# Patient Record
Sex: Male | Born: 1960 | Race: White | Hispanic: No | Marital: Married | Smoking: Never smoker
Health system: Southern US, Community
[De-identification: ages and names within clinical notes are randomized; demographics above are authoritative.]

## PROBLEM LIST (undated history)

## (undated) DIAGNOSIS — I10 Essential (primary) hypertension: Secondary | ICD-10-CM

## (undated) DIAGNOSIS — Z9889 Other specified postprocedural states: Secondary | ICD-10-CM

## (undated) DIAGNOSIS — J302 Other seasonal allergic rhinitis: Secondary | ICD-10-CM

## (undated) DIAGNOSIS — K219 Gastro-esophageal reflux disease without esophagitis: Secondary | ICD-10-CM

## (undated) DIAGNOSIS — M1990 Unspecified osteoarthritis, unspecified site: Secondary | ICD-10-CM

## (undated) DIAGNOSIS — I2699 Other pulmonary embolism without acute cor pulmonale: Secondary | ICD-10-CM

## (undated) DIAGNOSIS — R51 Headache: Secondary | ICD-10-CM

## (undated) DIAGNOSIS — K589 Irritable bowel syndrome without diarrhea: Secondary | ICD-10-CM

## (undated) DIAGNOSIS — Z87442 Personal history of urinary calculi: Secondary | ICD-10-CM

## (undated) DIAGNOSIS — I739 Peripheral vascular disease, unspecified: Secondary | ICD-10-CM

## (undated) DIAGNOSIS — R112 Nausea with vomiting, unspecified: Secondary | ICD-10-CM

## (undated) DIAGNOSIS — R519 Headache, unspecified: Secondary | ICD-10-CM

## (undated) HISTORY — PX: ANKLE SURGERY: SHX546

## (undated) HISTORY — PX: TONSILLECTOMY: SUR1361

## (undated) HISTORY — PX: KNEE SURGERY: SHX244

## (undated) HISTORY — PX: CARDIAC CATHETERIZATION: SHX172

## (undated) SURGERY — ARTHROPLASTY, KNEE, TOTAL
Anesthesia: Choice | Laterality: Left

---

## 1990-12-03 DIAGNOSIS — I2699 Other pulmonary embolism without acute cor pulmonale: Secondary | ICD-10-CM

## 1990-12-03 HISTORY — DX: Other pulmonary embolism without acute cor pulmonale: I26.99

## 1999-12-04 HISTORY — PX: PERIPHERALLY INSERTED CENTRAL CATHETER INSERTION: SHX2221

## 2000-12-03 HISTORY — PX: VOCAL CORD INJECTION: SHX2663

## 2002-07-21 ENCOUNTER — Ambulatory Visit (HOSPITAL_COMMUNITY): Admission: RE | Admit: 2002-07-21 | Discharge: 2002-07-22 | Payer: Self-pay | Admitting: Cardiology

## 2002-11-17 ENCOUNTER — Inpatient Hospital Stay (HOSPITAL_COMMUNITY): Admission: RE | Admit: 2002-11-17 | Discharge: 2002-11-20 | Payer: Self-pay | Admitting: Orthopedic Surgery

## 2002-11-17 ENCOUNTER — Encounter: Payer: Self-pay | Admitting: Orthopedic Surgery

## 2002-11-18 ENCOUNTER — Encounter: Payer: Self-pay | Admitting: Orthopedic Surgery

## 2003-04-28 ENCOUNTER — Inpatient Hospital Stay (HOSPITAL_COMMUNITY): Admission: AD | Admit: 2003-04-28 | Discharge: 2003-05-05 | Payer: Self-pay | Admitting: Orthopedic Surgery

## 2003-05-04 ENCOUNTER — Encounter: Payer: Self-pay | Admitting: Orthopedic Surgery

## 2007-12-04 DIAGNOSIS — I739 Peripheral vascular disease, unspecified: Secondary | ICD-10-CM

## 2007-12-04 HISTORY — DX: Peripheral vascular disease, unspecified: I73.9

## 2011-03-22 ENCOUNTER — Encounter (HOSPITAL_BASED_OUTPATIENT_CLINIC_OR_DEPARTMENT_OTHER): Payer: BC Managed Care – PPO | Admitting: Internal Medicine

## 2011-03-22 ENCOUNTER — Ambulatory Visit (HOSPITAL_COMMUNITY)
Admission: RE | Admit: 2011-03-22 | Discharge: 2011-03-22 | Disposition: A | Discharge status: Home / Self Care | Payer: BC Managed Care – PPO | Source: Ambulatory Visit | Attending: Internal Medicine | Admitting: Internal Medicine

## 2011-03-22 DIAGNOSIS — K644 Residual hemorrhoidal skin tags: Secondary | ICD-10-CM | POA: Insufficient documentation

## 2011-03-22 DIAGNOSIS — Z79899 Other long term (current) drug therapy: Secondary | ICD-10-CM | POA: Insufficient documentation

## 2011-03-22 DIAGNOSIS — Z7982 Long term (current) use of aspirin: Secondary | ICD-10-CM | POA: Insufficient documentation

## 2011-03-22 DIAGNOSIS — Z1211 Encounter for screening for malignant neoplasm of colon: Secondary | ICD-10-CM | POA: Insufficient documentation

## 2011-03-22 DIAGNOSIS — I10 Essential (primary) hypertension: Secondary | ICD-10-CM | POA: Insufficient documentation

## 2011-04-08 NOTE — Op Note (Signed)
  NAME:  Jeremy [name], Jeremy [name]                ACCOUNT NO.:  [id number]  MEDICAL RECORD NO.:  [id number]           PATIENT TYPE:  O  LOCATION:  [facility]                          FACILITY:  [facility]  PHYSICIAN:  Lionel December, M.D.    DATE OF BIRTH:  12-22-1960  DATE OF PROCEDURE: DATE OF DISCHARGE:                              OPERATIVE REPORT   PROCEDURE:  Colonoscopy.  INDICATION:  The patient is 50 year old Caucasian male who is undergoing average risk screening colonoscopy.  Procedures were reviewed with the patient.  Informed consent was obtained.  MEDS FOR CONSCIOUS SEDATION:  Demerol 50 mg IV, Versed 10 mg IV in divided dose.  FINDINGS:  Procedure performed in endoscopy suite.  The patient's vital signs and O2 sat were monitored during the procedure and remained stable.  The patient was placed in left lateral recumbent position and rectal examination performed.  No abnormality noted on external or digital exam.  Pentax videoscope was placed through rectum and advanced under vision into sigmoid colon and beyond.  Preparation was excellent. Scope was easily passed into cecum which was identified by ileocecal valve and appendiceal orifice.  There was a scar over the ileocecal valve.  Pictures were taken for the record.  As the scope was withdrawn, colonic mucosa was carefully examined and was normal throughout.  The rectal mucosa similarly was normal.  Scope was retroflexed to examine anorectal junction and small hemorrhoids noted below the dentate line. Endoscope was then withdrawn.  Withdrawal time was 12 minutes.  The patient tolerated the procedure well.  FINAL DIAGNOSES: 1. 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 2. Small external hemorrhoids, otherwise normal colonoscopy.  RECOMMENDATIONS: 1. Standard instructions given.  He will resume his Coumadin today and     get his INR checked in 10-14 days. 2. His next  screening for CRC would be in 10 years.     Lionel December, M.D.     NR/MEDQ  D:  03/22/2011  T:  03/22/2011  Job:  [id number]  cc:   Donzetta Sprung Fax: [phone]  Electronically Signed by Lionel December M.D. on 04/08/2011 09:42:00 PM

## 2016-02-27 ENCOUNTER — Other Ambulatory Visit: Payer: Self-pay | Admitting: Orthopedic Surgery

## 2016-02-28 ENCOUNTER — Other Ambulatory Visit: Payer: Self-pay | Admitting: Orthopedic Surgery

## 2016-02-29 ENCOUNTER — Encounter (HOSPITAL_COMMUNITY): Payer: Self-pay

## 2016-02-29 ENCOUNTER — Encounter (HOSPITAL_COMMUNITY)
Admission: RE | Admit: 2016-02-29 | Discharge: 2016-02-29 | Disposition: A | Discharge status: Home / Self Care | Payer: PRIVATE HEALTH INSURANCE | Source: Ambulatory Visit | Attending: Orthopedic Surgery | Admitting: Orthopedic Surgery

## 2016-02-29 ENCOUNTER — Ambulatory Visit (HOSPITAL_COMMUNITY)
Admission: RE | Admit: 2016-02-29 | Discharge: 2016-02-29 | Disposition: A | Discharge status: Home / Self Care | Payer: PRIVATE HEALTH INSURANCE | Source: Ambulatory Visit | Attending: Orthopedic Surgery | Admitting: Orthopedic Surgery

## 2016-02-29 DIAGNOSIS — Z01812 Encounter for preprocedural laboratory examination: Secondary | ICD-10-CM | POA: Insufficient documentation

## 2016-02-29 DIAGNOSIS — Z01818 Encounter for other preprocedural examination: Secondary | ICD-10-CM

## 2016-02-29 DIAGNOSIS — Z01810 Encounter for preprocedural cardiovascular examination: Secondary | ICD-10-CM | POA: Diagnosis not present

## 2016-02-29 DIAGNOSIS — R9431 Abnormal electrocardiogram [ECG] [EKG]: Secondary | ICD-10-CM | POA: Insufficient documentation

## 2016-02-29 HISTORY — DX: Other seasonal allergic rhinitis: J30.2

## 2016-02-29 HISTORY — DX: Other pulmonary embolism without acute cor pulmonale: I26.99

## 2016-02-29 HISTORY — DX: Unspecified osteoarthritis, unspecified site: M19.90

## 2016-02-29 HISTORY — DX: Personal history of urinary calculi: Z87.442

## 2016-02-29 HISTORY — DX: Essential (primary) hypertension: I10

## 2016-02-29 HISTORY — DX: Gastro-esophageal reflux disease without esophagitis: K21.9

## 2016-02-29 HISTORY — DX: Headache: R51

## 2016-02-29 HISTORY — DX: Peripheral vascular disease, unspecified: I73.9

## 2016-02-29 HISTORY — DX: Headache, unspecified: R51.9

## 2016-02-29 LAB — COMPREHENSIVE METABOLIC PANEL
ALBUMIN: 4 g/dL (ref 3.5–5.0)
ALK PHOS: 53 U/L (ref 38–126)
ALT: 27 U/L (ref 17–63)
ANION GAP: 9 (ref 5–15)
AST: 26 U/L (ref 15–41)
BILIRUBIN TOTAL: 1 mg/dL (ref 0.3–1.2)
BUN: 19 mg/dL (ref 6–20)
CALCIUM: 9.6 mg/dL (ref 8.9–10.3)
CO2: 30 mmol/L (ref 22–32)
Chloride: 101 mmol/L (ref 101–111)
Creatinine, Ser: 1.1 mg/dL (ref 0.61–1.24)
GLUCOSE: 109 mg/dL — AB (ref 65–99)
POTASSIUM: 3.8 mmol/L (ref 3.5–5.1)
Sodium: 140 mmol/L (ref 135–145)
TOTAL PROTEIN: 7.3 g/dL (ref 6.5–8.1)

## 2016-02-29 LAB — CBC WITH DIFFERENTIAL/PLATELET
Basophils Absolute: 0.1 10*3/uL (ref 0.0–0.1)
Basophils Relative: 1 %
EOS ABS: 0.1 10*3/uL (ref 0.0–0.7)
EOS PCT: 1 %
HCT: 46.5 % (ref 39.0–52.0)
Hemoglobin: 16.6 g/dL (ref 13.0–17.0)
LYMPHS ABS: 3.2 10*3/uL (ref 0.7–4.0)
LYMPHS PCT: 37 %
MCH: 31.4 pg (ref 26.0–34.0)
MCHC: 35.7 g/dL (ref 30.0–36.0)
MCV: 88.1 fL (ref 78.0–100.0)
MONOS PCT: 8 %
Monocytes Absolute: 0.7 10*3/uL (ref 0.1–1.0)
Neutro Abs: 4.7 10*3/uL (ref 1.7–7.7)
Neutrophils Relative %: 53 %
Platelets: 187 10*3/uL (ref 150–400)
RBC: 5.28 MIL/uL (ref 4.22–5.81)
RDW: 13.2 % (ref 11.5–15.5)
WBC: 8.7 10*3/uL (ref 4.0–10.5)

## 2016-02-29 LAB — URINALYSIS, ROUTINE W REFLEX MICROSCOPIC
Bilirubin Urine: NEGATIVE
Glucose, UA: NEGATIVE mg/dL
HGB URINE DIPSTICK: NEGATIVE
Ketones, ur: NEGATIVE mg/dL
Leukocytes, UA: NEGATIVE
Nitrite: NEGATIVE
PH: 5 (ref 5.0–8.0)
Protein, ur: NEGATIVE mg/dL
SPECIFIC GRAVITY, URINE: 1.011 (ref 1.005–1.030)

## 2016-02-29 LAB — SURGICAL PCR SCREEN
MRSA, PCR: NEGATIVE
STAPHYLOCOCCUS AUREUS: NEGATIVE

## 2016-02-29 LAB — TYPE AND SCREEN
ABO/RH(D): A POS
Antibody Screen: NEGATIVE

## 2016-02-29 LAB — PROTIME-INR
INR: 1.9 — ABNORMAL HIGH (ref 0.00–1.49)
PROTHROMBIN TIME: 21.7 s — AB (ref 11.6–15.2)

## 2016-02-29 LAB — ABO/RH: ABO/RH(D): A POS

## 2016-02-29 LAB — APTT: aPTT: 32 seconds (ref 24–37)

## 2016-02-29 NOTE — Progress Notes (Signed)
Pt. Denies any care from cardiologist, is seen by Dr. Quillian Quince- PCP at [facility] in [place]. Pt. Had a stress test 6- 7 yrs. Ago. 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 Also has has Just DVT's post op without the PE.

## 2016-02-29 NOTE — Pre-Procedure Instructions (Addendum)
Jeremy [name]  02/29/2016      [facility] - [place], [place] - [place] [address] Phone: [phone] Fax: [phone]    Your procedure is scheduled on  03/05/2016  Report to [facility] Admitting at 10:30 A.M.  Call this number if you have problems the morning of surgery:  [phone]   Remember:  Do not eat food or drink liquids after midnight.  On Sunday   Take these medicines the morning of surgery with A SIP OF WATER : Omeprazole   Do not wear jewelry   Do not wear lotions, powders, or perfumes.  You may wear deodorant.              Men may shave face and neck.   Do not bring valuables to the hospital.   [name] is not responsible for any belongings or valuables.  Contacts, dentures or bridgework may not be worn into surgery.  Leave your suitcase in the car.  After surgery it may be brought to your room.  For patients admitted to the hospital, discharge time will be determined by your treatment team.  Patients discharged the day of surgery will not be allowed to drive home.   Name and phone number of your driver:   With spouse   Special instructions:  Special Instructions: [name] - Preparing for Surgery  Before surgery, you can play an important role.  Because skin is not sterile, your skin needs to be as free of germs as possible.  You can reduce the number of germs on you skin by washing with CHG (chlorahexidine gluconate) soap before surgery.  CHG is an antiseptic cleaner which kills germs and bonds with the skin to continue killing germs even after washing.  Please DO NOT use if you have an allergy to CHG or antibacterial soaps.  If your skin becomes reddened/irritated stop using the CHG and inform your nurse when you arrive at Short Stay.  Do not shave (including legs and underarms) for at least 48 hours prior to the first CHG shower.  You may shave your face.  Please follow these instructions carefully:   1.   Shower with CHG Soap the night before surgery and the  morning of Surgery.  2.  If you choose to wash your hair, wash your hair first as usual with your  normal shampoo.  3.  After you shampoo, rinse your hair and body thoroughly to remove the  Shampoo.  4.  Use CHG as you would any other liquid soap.  You can apply chg directly to the skin and wash gently with scrungie or a clean washcloth.  5.  Apply the CHG Soap to your body ONLY FROM THE NECK DOWN.    Do not use on open wounds or open sores.  Avoid contact with your eyes, ears, mouth and genitals (private parts).  Wash genitals (private parts)   with your normal soap.  6.  Wash thoroughly, paying special attention to the area where your surgery will be performed.  7.  Thoroughly rinse your body with warm water from the neck down.  8.  DO NOT shower/wash with your normal soap after using and rinsing off   the CHG Soap.  9.  Pat yourself dry with a clean towel.            10 .  Wear clean pajamas.  11.  Place clean sheets on your bed the night of your first shower and do not sleep with pets.  Day of Surgery  Do not apply any lotions/deodorants the morning of surgery.  Please wear clean clothes to the hospital/surgery center.  Please read over the following fact sheets that you were given. Pain Booklet, Coughing and Deep Breathing, Blood Transfusion Information, MRSA Information and Surgical Site Infection Prevention, joint replacement book

## 2016-02-29 NOTE — Progress Notes (Signed)
Call to Tracie at Dr. Berenice Primas office, asked that Dr. Berenice Primas be notified that pt. Has not been instructed on Warfarin schedule.  The only thing he has been instructed on was to give Lovenox post op. She agrees to f/u & report the same to Dr. Berenice Primas.

## 2016-02-29 NOTE — Progress Notes (Signed)
Call back from Dr. Berenice Primas office, order given to tell pt. To hold warfarin 4 days prior to surgery & then start Lovenox. Pt. Given the same instruction & states the Lovenox has been ordered through mail order it is scheduled to be rec'd tomorrow.

## 2016-02-29 NOTE — Progress Notes (Signed)
   02/29/16 1613  OBSTRUCTIVE SLEEP APNEA  Have you ever been diagnosed with sleep apnea through a sleep study? No  Do you snore loudly (loud enough to be heard through closed doors)?  0  Do you often feel tired, fatigued, or sleepy during the daytime (such as falling asleep during driving or talking to someone)? 1 (not sleeping well due to pain )  Has anyone observed you stop breathing during your sleep? 0  Do you have, or are you being treated for high blood pressure? 1  BMI more than 35 kg/m2? 0  Age > 50 (1-yes) 1  Neck circumference greater than:Male 16 inches or larger, Male 17inches or larger? 1  Male Gender (Yes=1) 1  Obstructive Sleep Apnea Score 5  Score 5 or greater  Results sent to PCP

## 2016-03-04 MED ORDER — CHLORHEXIDINE GLUCONATE 4 % EX LIQD: 60.0000 mL | Freq: Once | CUTANEOUS | Status: DC

## 2016-03-04 MED ORDER — CEFAZOLIN SODIUM-DEXTROSE 2-4 GM/100ML-% IV SOLN
2.0000 g | INTRAVENOUS | Status: AC
  Administered 2016-03-05: 2 g via INTRAVENOUS
  Filled 2016-03-04: qty 100

## 2016-03-05 ENCOUNTER — Encounter (HOSPITAL_COMMUNITY)
Admission: RE | Disposition: A | Discharge status: Home Health | Payer: Self-pay | Source: Ambulatory Visit | Attending: Orthopedic Surgery

## 2016-03-05 ENCOUNTER — Encounter (HOSPITAL_COMMUNITY): Payer: Self-pay | Admitting: *Deleted

## 2016-03-05 ENCOUNTER — Inpatient Hospital Stay (HOSPITAL_COMMUNITY)
Admission: RE | Admit: 2016-03-05 | Discharge: 2016-03-06 | DRG: 470 | Disposition: A | Discharge status: Home Health | Payer: PRIVATE HEALTH INSURANCE | Source: Ambulatory Visit | Attending: Orthopedic Surgery | Admitting: Orthopedic Surgery

## 2016-03-05 ENCOUNTER — Inpatient Hospital Stay (HOSPITAL_COMMUNITY): Payer: PRIVATE HEALTH INSURANCE | Admitting: Certified Registered"

## 2016-03-05 DIAGNOSIS — Z86711 Personal history of pulmonary embolism: Secondary | ICD-10-CM

## 2016-03-05 DIAGNOSIS — I10 Essential (primary) hypertension: Secondary | ICD-10-CM | POA: Diagnosis present

## 2016-03-05 DIAGNOSIS — K219 Gastro-esophageal reflux disease without esophagitis: Secondary | ICD-10-CM | POA: Diagnosis present

## 2016-03-05 DIAGNOSIS — Z86718 Personal history of other venous thrombosis and embolism: Secondary | ICD-10-CM

## 2016-03-05 DIAGNOSIS — M1712 Unilateral primary osteoarthritis, left knee: Secondary | ICD-10-CM | POA: Diagnosis present

## 2016-03-05 DIAGNOSIS — M25562 Pain in left knee: Secondary | ICD-10-CM | POA: Diagnosis present

## 2016-03-05 DIAGNOSIS — Z7901 Long term (current) use of anticoagulants: Secondary | ICD-10-CM | POA: Diagnosis not present

## 2016-03-05 DIAGNOSIS — M1711 Unilateral primary osteoarthritis, right knee: Secondary | ICD-10-CM | POA: Diagnosis present

## 2016-03-05 DIAGNOSIS — Z7982 Long term (current) use of aspirin: Secondary | ICD-10-CM

## 2016-03-05 DIAGNOSIS — I739 Peripheral vascular disease, unspecified: Secondary | ICD-10-CM | POA: Diagnosis present

## 2016-03-05 HISTORY — DX: Unilateral primary osteoarthritis, right knee: M17.11

## 2016-03-05 HISTORY — PX: TOTAL KNEE ARTHROPLASTY: SHX125

## 2016-03-05 LAB — PROTIME-INR
INR: 1.12 (ref 0.00–1.49)
PROTHROMBIN TIME: 14.6 s (ref 11.6–15.2)

## 2016-03-05 SURGERY — ARTHROPLASTY, KNEE, TOTAL
Anesthesia: Regional | Site: Knee | Laterality: Left

## 2016-03-05 MED ORDER — BISACODYL 5 MG PO TBEC: 5.0000 mg | DELAYED_RELEASE_TABLET | Freq: Every day | ORAL | Status: DC | PRN

## 2016-03-05 MED ORDER — DOCUSATE SODIUM 100 MG PO CAPS
100.0000 mg | ORAL_CAPSULE | Freq: Two times a day (BID) | ORAL | Status: DC
  Administered 2016-03-05 – 2016-03-06 (×2): 100 mg via ORAL
  Filled 2016-03-05 (×2): qty 1

## 2016-03-05 MED ORDER — ZOLPIDEM TARTRATE 5 MG PO TABS: 5.0000 mg | ORAL_TABLET | Freq: Every evening | ORAL | Status: DC | PRN

## 2016-03-05 MED ORDER — FENTANYL CITRATE (PF) 100 MCG/2ML IJ SOLN
INTRAMUSCULAR | Status: DC | PRN
  Administered 2016-03-05 (×2): 25 ug via INTRAVENOUS
  Administered 2016-03-05: 50 ug via INTRAVENOUS
  Administered 2016-03-05 (×4): 25 ug via INTRAVENOUS

## 2016-03-05 MED ORDER — DEXAMETHASONE SODIUM PHOSPHATE 10 MG/ML IJ SOLN
10.0000 mg | Freq: Two times a day (BID) | INTRAMUSCULAR | Status: DC
  Administered 2016-03-05: 10 mg via INTRAVENOUS

## 2016-03-05 MED ORDER — ARTIFICIAL TEARS OP OINT
TOPICAL_OINTMENT | OPHTHALMIC | Status: AC
  Filled 2016-03-05: qty 3.5

## 2016-03-05 MED ORDER — ACETAMINOPHEN 650 MG RE SUPP: 650.0000 mg | Freq: Four times a day (QID) | RECTAL | Status: DC | PRN

## 2016-03-05 MED ORDER — DEXAMETHASONE SODIUM PHOSPHATE 10 MG/ML IJ SOLN
INTRAMUSCULAR | Status: AC
  Filled 2016-03-05: qty 1

## 2016-03-05 MED ORDER — LACTATED RINGERS IV SOLN
INTRAVENOUS | Status: DC
  Administered 2016-03-05 (×3): via INTRAVENOUS

## 2016-03-05 MED ORDER — CEFAZOLIN SODIUM-DEXTROSE 2-4 GM/100ML-% IV SOLN
2.0000 g | Freq: Four times a day (QID) | INTRAVENOUS | Status: AC
  Administered 2016-03-05 – 2016-03-06 (×2): 2 g via INTRAVENOUS
  Filled 2016-03-05 (×2): qty 100

## 2016-03-05 MED ORDER — MAGNESIUM CITRATE PO SOLN: 1.0000 | Freq: Once | ORAL | Status: DC | PRN

## 2016-03-05 MED ORDER — WARFARIN - PHARMACIST DOSING INPATIENT: Freq: Every day | Status: DC

## 2016-03-05 MED ORDER — WARFARIN SODIUM 3 MG PO TABS: 3.0000 mg | ORAL_TABLET | Freq: Every day | ORAL | Status: DC

## 2016-03-05 MED ORDER — ONDANSETRON HCL 4 MG/2ML IJ SOLN
INTRAMUSCULAR | Status: AC
  Filled 2016-03-05: qty 2

## 2016-03-05 MED ORDER — MIDAZOLAM HCL 2 MG/2ML IJ SOLN
2.0000 mg | Freq: Once | INTRAMUSCULAR | Status: AC
  Administered 2016-03-05: 2 mg via INTRAVENOUS

## 2016-03-05 MED ORDER — WARFARIN SODIUM 5 MG PO TABS
5.0000 mg | ORAL_TABLET | Freq: Once | ORAL | Status: AC
  Administered 2016-03-05: 5 mg via ORAL
  Filled 2016-03-05: qty 1

## 2016-03-05 MED ORDER — DEXAMETHASONE SODIUM PHOSPHATE 10 MG/ML IJ SOLN
10.0000 mg | Freq: Two times a day (BID) | INTRAMUSCULAR | Status: AC
  Administered 2016-03-06: 10 mg via INTRAVENOUS
  Filled 2016-03-05: qty 1

## 2016-03-05 MED ORDER — BUPIVACAINE HCL (PF) 0.5 % IJ SOLN
INTRAMUSCULAR | Status: AC
  Filled 2016-03-05: qty 30

## 2016-03-05 MED ORDER — DIPHENHYDRAMINE HCL 12.5 MG/5ML PO ELIX: 12.5000 mg | ORAL_SOLUTION | ORAL | Status: DC | PRN

## 2016-03-05 MED ORDER — TRANEXAMIC ACID 1000 MG/10ML IV SOLN
2000.0000 mg | INTRAVENOUS | Status: AC
  Administered 2016-03-05: 2000 mg via TOPICAL
  Filled 2016-03-05: qty 20

## 2016-03-05 MED ORDER — PHENYLEPHRINE HCL 10 MG/ML IJ SOLN
INTRAMUSCULAR | Status: DC | PRN
  Administered 2016-03-05: 80 ug via INTRAVENOUS

## 2016-03-05 MED ORDER — METHOCARBAMOL 750 MG PO TABS: 750.0000 mg | ORAL_TABLET | Freq: Three times a day (TID) | ORAL | Status: DC | PRN

## 2016-03-05 MED ORDER — SODIUM CHLORIDE 0.9 % IJ SOLN
INTRAMUSCULAR | Status: DC | PRN
  Administered 2016-03-05: 20 mL

## 2016-03-05 MED ORDER — BUPIVACAINE LIPOSOME 1.3 % IJ SUSP
20.0000 mL | INTRAMUSCULAR | Status: DC
  Filled 2016-03-05: qty 20

## 2016-03-05 MED ORDER — 0.9 % SODIUM CHLORIDE (POUR BTL) OPTIME
TOPICAL | Status: DC | PRN
  Administered 2016-03-05: 1000 mL

## 2016-03-05 MED ORDER — ASPIRIN EC 81 MG PO TBEC
81.0000 mg | DELAYED_RELEASE_TABLET | Freq: Every morning | ORAL | Status: DC
  Administered 2016-03-06: 81 mg via ORAL
  Filled 2016-03-05: qty 1

## 2016-03-05 MED ORDER — METOPROLOL SUCCINATE ER 25 MG PO TB24
25.0000 mg | ORAL_TABLET | Freq: Every day | ORAL | Status: DC
  Administered 2016-03-05: 25 mg via ORAL
  Filled 2016-03-05: qty 1

## 2016-03-05 MED ORDER — HYDROMORPHONE HCL 1 MG/ML IJ SOLN
INTRAMUSCULAR | Status: AC
  Filled 2016-03-05: qty 1

## 2016-03-05 MED ORDER — LIDOCAINE HCL (CARDIAC) 20 MG/ML IV SOLN
INTRAVENOUS | Status: DC | PRN
  Administered 2016-03-05: 40 mg via INTRAVENOUS

## 2016-03-05 MED ORDER — HYDROMORPHONE HCL 1 MG/ML IJ SOLN
1.0000 mg | INTRAMUSCULAR | Status: DC | PRN
  Administered 2016-03-06: 1 mg via INTRAVENOUS
  Filled 2016-03-05: qty 1

## 2016-03-05 MED ORDER — PANTOPRAZOLE SODIUM 40 MG PO TBEC
40.0000 mg | DELAYED_RELEASE_TABLET | Freq: Every day | ORAL | Status: DC
  Administered 2016-03-06: 40 mg via ORAL
  Filled 2016-03-05: qty 1

## 2016-03-05 MED ORDER — FENTANYL CITRATE (PF) 100 MCG/2ML IJ SOLN
INTRAMUSCULAR | Status: AC
  Administered 2016-03-05: 100 ug via INTRAVENOUS
  Filled 2016-03-05: qty 2

## 2016-03-05 MED ORDER — OXYCODONE-ACETAMINOPHEN 5-325 MG PO TABS: 1.0000 | ORAL_TABLET | ORAL | Status: DC | PRN

## 2016-03-05 MED ORDER — ROPIVACAINE HCL 5 MG/ML IJ SOLN
INTRAMUSCULAR | Status: DC | PRN
  Administered 2016-03-05: 30 mL via PERINEURAL

## 2016-03-05 MED ORDER — BUPIVACAINE HCL (PF) 0.5 % IJ SOLN: INTRAMUSCULAR | Status: DC | PRN

## 2016-03-05 MED ORDER — ACETAMINOPHEN 325 MG PO TABS: 650.0000 mg | ORAL_TABLET | Freq: Four times a day (QID) | ORAL | Status: DC | PRN

## 2016-03-05 MED ORDER — OXYCODONE HCL 5 MG PO TABS
ORAL_TABLET | ORAL | Status: AC
  Filled 2016-03-05: qty 2

## 2016-03-05 MED ORDER — ONDANSETRON HCL 4 MG PO TABS: 4.0000 mg | ORAL_TABLET | Freq: Four times a day (QID) | ORAL | Status: DC | PRN

## 2016-03-05 MED ORDER — ONDANSETRON HCL 4 MG/2ML IJ SOLN
INTRAMUSCULAR | Status: DC | PRN
  Administered 2016-03-05: 4 mg via INTRAVENOUS

## 2016-03-05 MED ORDER — KETOROLAC TROMETHAMINE 15 MG/ML IJ SOLN
7.5000 mg | Freq: Four times a day (QID) | INTRAMUSCULAR | Status: AC
  Administered 2016-03-05 – 2016-03-06 (×4): 7.5 mg via INTRAVENOUS
  Filled 2016-03-05 (×4): qty 1

## 2016-03-05 MED ORDER — ONDANSETRON HCL 4 MG/2ML IJ SOLN: 4.0000 mg | Freq: Four times a day (QID) | INTRAMUSCULAR | Status: DC | PRN

## 2016-03-05 MED ORDER — ROPIVACAINE HCL 5 MG/ML IJ SOLN: INTRAMUSCULAR | Status: DC | PRN

## 2016-03-05 MED ORDER — MIDAZOLAM HCL 2 MG/2ML IJ SOLN
INTRAMUSCULAR | Status: AC
  Filled 2016-03-05: qty 2

## 2016-03-05 MED ORDER — ENOXAPARIN SODIUM 100 MG/ML ~~LOC~~ SOLN
100.0000 mg | Freq: Two times a day (BID) | SUBCUTANEOUS | Status: DC
  Administered 2016-03-06 (×2): 100 mg via SUBCUTANEOUS
  Filled 2016-03-05 (×4): qty 1

## 2016-03-05 MED ORDER — SODIUM CHLORIDE 0.9 % IV SOLN: INTRAVENOUS | Status: DC

## 2016-03-05 MED ORDER — TRANEXAMIC ACID 1000 MG/10ML IV SOLN
1000.0000 mg | INTRAVENOUS | Status: DC
  Filled 2016-03-05 (×2): qty 10

## 2016-03-05 MED ORDER — POLYETHYLENE GLYCOL 3350 17 G PO PACK: 17.0000 g | PACK | Freq: Every day | ORAL | Status: DC | PRN

## 2016-03-05 MED ORDER — LIDOCAINE HCL (CARDIAC) 20 MG/ML IV SOLN
INTRAVENOUS | Status: AC
  Filled 2016-03-05: qty 5

## 2016-03-05 MED ORDER — ALUM & MAG HYDROXIDE-SIMETH 200-200-20 MG/5ML PO SUSP: 30.0000 mL | ORAL | Status: DC | PRN

## 2016-03-05 MED ORDER — PHENYLEPHRINE 40 MCG/ML (10ML) SYRINGE FOR IV PUSH (FOR BLOOD PRESSURE SUPPORT)
PREFILLED_SYRINGE | INTRAVENOUS | Status: AC
  Filled 2016-03-05: qty 10

## 2016-03-05 MED ORDER — SODIUM CHLORIDE 0.9 % IR SOLN
Status: DC | PRN
  Administered 2016-03-05: 3000 mL

## 2016-03-05 MED ORDER — DEXTROSE 5 % IV SOLN
500.0000 mg | Freq: Four times a day (QID) | INTRAVENOUS | Status: DC | PRN
  Administered 2016-03-05: 500 mg via INTRAVENOUS
  Filled 2016-03-05 (×2): qty 5

## 2016-03-05 MED ORDER — OXYCODONE HCL 5 MG PO TABS
5.0000 mg | ORAL_TABLET | ORAL | Status: DC | PRN
  Administered 2016-03-05 – 2016-03-06 (×7): 10 mg via ORAL
  Filled 2016-03-05 (×6): qty 2

## 2016-03-05 MED ORDER — ARTIFICIAL TEARS OP OINT
TOPICAL_OINTMENT | OPHTHALMIC | Status: DC | PRN
  Administered 2016-03-05: 1 via OPHTHALMIC

## 2016-03-05 MED ORDER — HYDROCHLOROTHIAZIDE 25 MG PO TABS
25.0000 mg | ORAL_TABLET | Freq: Every morning | ORAL | Status: DC
  Administered 2016-03-06: 25 mg via ORAL
  Filled 2016-03-05: qty 1

## 2016-03-05 MED ORDER — METHOCARBAMOL 500 MG PO TABS
500.0000 mg | ORAL_TABLET | Freq: Four times a day (QID) | ORAL | Status: DC | PRN
  Administered 2016-03-06 (×2): 500 mg via ORAL
  Filled 2016-03-05 (×3): qty 1

## 2016-03-05 MED ORDER — HYDROMORPHONE HCL 1 MG/ML IJ SOLN
0.2500 mg | INTRAMUSCULAR | Status: DC | PRN
  Administered 2016-03-05 (×4): 0.5 mg via INTRAVENOUS

## 2016-03-05 MED ORDER — FENTANYL CITRATE (PF) 250 MCG/5ML IJ SOLN
INTRAMUSCULAR | Status: AC
  Filled 2016-03-05: qty 5

## 2016-03-05 MED ORDER — CILIDINIUM-CHLORDIAZEPOXIDE 2.5-5 MG PO CAPS
1.0000 | ORAL_CAPSULE | Freq: Three times a day (TID) | ORAL | Status: DC
  Administered 2016-03-06 (×3): 1 via ORAL
  Filled 2016-03-05 (×3): qty 1

## 2016-03-05 MED ORDER — MIDAZOLAM HCL 2 MG/2ML IJ SOLN
INTRAMUSCULAR | Status: AC
  Administered 2016-03-05: 2 mg via INTRAVENOUS
  Filled 2016-03-05: qty 2

## 2016-03-05 MED ORDER — FENTANYL CITRATE (PF) 100 MCG/2ML IJ SOLN
100.0000 ug | Freq: Once | INTRAMUSCULAR | Status: AC
  Administered 2016-03-05: 100 ug via INTRAVENOUS

## 2016-03-05 MED ORDER — PROPOFOL 10 MG/ML IV BOLUS
INTRAVENOUS | Status: DC | PRN
  Administered 2016-03-05: 200 mg via INTRAVENOUS

## 2016-03-05 MED ORDER — ARTIFICIAL TEARS OP OINT
TOPICAL_OINTMENT | OPHTHALMIC | Status: AC
  Filled 2016-03-05: qty 7

## 2016-03-05 MED ORDER — BUPIVACAINE LIPOSOME 1.3 % IJ SUSP
INTRAMUSCULAR | Status: DC | PRN
  Administered 2016-03-05: 20 mL

## 2016-03-05 SURGICAL SUPPLY — 74 items
APL SKNCLS STERI-STRIP NONHPOA (GAUZE/BANDAGES/DRESSINGS) ×1
BANDAGE ESMARK 6X9 LF (GAUZE/BANDAGES/DRESSINGS) ×1 IMPLANT
BENZOIN TINCTURE PRP APPL 2/3 (GAUZE/BANDAGES/DRESSINGS) ×3 IMPLANT
BLADE SAGITTAL 25.0X1.19X90 (BLADE) ×2 IMPLANT
BLADE SAGITTAL 25.0X1.19X90MM (BLADE) ×1
BLADE SAW SAG 90X13X1.27 (BLADE) ×3 IMPLANT
BNDG CMPR 9X6 STRL LF SNTH (GAUZE/BANDAGES/DRESSINGS) ×1
BNDG ESMARK 6X9 LF (GAUZE/BANDAGES/DRESSINGS) ×3
BOWL SMART MIX CTS (DISPOSABLE) ×3 IMPLANT
CAP KNEE TOTAL 3 SIGMA ×2 IMPLANT
CEMENT BONE DEPUY (Cement) ×3 IMPLANT
CEMENT HV SMART SET (Cement) ×4 IMPLANT
CLOSURE STERI-STRIP 1/2X4 (GAUZE/BANDAGES/DRESSINGS) ×1
CLOSURE WOUND 1/2 X4 (GAUZE/BANDAGES/DRESSINGS) ×2
CLSR STERI-STRIP ANTIMIC 1/2X4 (GAUZE/BANDAGES/DRESSINGS) ×1 IMPLANT
COVER SURGICAL LIGHT HANDLE (MISCELLANEOUS) ×3 IMPLANT
CUFF TOURNIQUET SINGLE 34IN LL (TOURNIQUET CUFF) ×3 IMPLANT
CUFF TOURNIQUET SINGLE 44IN (TOURNIQUET CUFF) IMPLANT
DRAPE EXTREMITY T 121X128X90 (DRAPE) ×5 IMPLANT
DRAPE IMP U-DRAPE 54X76 (DRAPES) ×3 IMPLANT
DRAPE U-SHAPE 47X51 STRL (DRAPES) ×3 IMPLANT
DRSG AQUACEL AG ADV 3.5X10 (GAUZE/BANDAGES/DRESSINGS) ×2 IMPLANT
DRSG MEPILEX BORDER 4X12 (GAUZE/BANDAGES/DRESSINGS) ×3 IMPLANT
DRSG PAD ABDOMINAL 8X10 ST (GAUZE/BANDAGES/DRESSINGS) ×3 IMPLANT
DURAPREP 26ML APPLICATOR (WOUND CARE) ×5 IMPLANT
ELECT REM PT RETURN 9FT ADLT (ELECTROSURGICAL) ×3
ELECTRODE REM PT RTRN 9FT ADLT (ELECTROSURGICAL) ×1 IMPLANT
EVACUATOR 1/8 PVC DRAIN (DRAIN) ×3 IMPLANT
FACESHIELD WRAPAROUND (MASK) ×3 IMPLANT
FACESHIELD WRAPAROUND OR TEAM (MASK) ×1 IMPLANT
GAUZE SPONGE 4X4 12PLY STRL (GAUZE/BANDAGES/DRESSINGS) ×3 IMPLANT
GLOVE BIOGEL PI IND STRL 6.5 (GLOVE) IMPLANT
GLOVE BIOGEL PI IND STRL 8 (GLOVE) ×2 IMPLANT
GLOVE BIOGEL PI INDICATOR 6.5 (GLOVE) ×6
GLOVE BIOGEL PI INDICATOR 8 (GLOVE) ×4
GLOVE ECLIPSE 7.5 STRL STRAW (GLOVE) ×12 IMPLANT
GOWN STRL REUS W/ TWL LRG LVL3 (GOWN DISPOSABLE) ×1 IMPLANT
GOWN STRL REUS W/ TWL XL LVL3 (GOWN DISPOSABLE) ×2 IMPLANT
GOWN STRL REUS W/TWL LRG LVL3 (GOWN DISPOSABLE) ×6
GOWN STRL REUS W/TWL XL LVL3 (GOWN DISPOSABLE) ×6
HANDPIECE INTERPULSE COAX TIP (DISPOSABLE) ×3
HOOD PEEL AWAY FACE SHEILD DIS (HOOD) ×9 IMPLANT
IMMOBILIZER KNEE 20 (SOFTGOODS) IMPLANT
IMMOBILIZER KNEE 22 (SOFTGOODS) ×2 IMPLANT
IMMOBILIZER KNEE 22 UNIV (SOFTGOODS) ×3 IMPLANT
KIT BASIN OR (CUSTOM PROCEDURE TRAY) ×3 IMPLANT
KIT ROOM TURNOVER OR (KITS) ×3 IMPLANT
MANIFOLD NEPTUNE II (INSTRUMENTS) ×3 IMPLANT
NDL SPNL 22GX3.5 QUINCKE BK (NEEDLE) ×1 IMPLANT
NEEDLE SPNL 22GX3.5 QUINCKE BK (NEEDLE) ×3 IMPLANT
NS IRRIG 1000ML POUR BTL (IV SOLUTION) ×3 IMPLANT
PACK TOTAL JOINT (CUSTOM PROCEDURE TRAY) ×3 IMPLANT
PACK UNIVERSAL I (CUSTOM PROCEDURE TRAY) ×3 IMPLANT
PAD ABD 8X10 STRL (GAUZE/BANDAGES/DRESSINGS) ×2 IMPLANT
PAD ARMBOARD 7.5X6 YLW CONV (MISCELLANEOUS) ×6 IMPLANT
PAD CAST 4YDX4 CTTN HI CHSV (CAST SUPPLIES) ×1 IMPLANT
PADDING CAST COTTON 4X4 STRL (CAST SUPPLIES) ×3
SET HNDPC FAN SPRY TIP SCT (DISPOSABLE) ×1 IMPLANT
STAPLER VISISTAT 35W (STAPLE) IMPLANT
STRIP CLOSURE SKIN 1/2X4 (GAUZE/BANDAGES/DRESSINGS) ×4 IMPLANT
SUCTION FRAZIER HANDLE 10FR (MISCELLANEOUS) ×2
SUCTION TUBE FRAZIER 10FR DISP (MISCELLANEOUS) ×1 IMPLANT
SUT MNCRL AB 3-0 PS2 18 (SUTURE) IMPLANT
SUT VIC AB 0 CTB1 27 (SUTURE) ×6 IMPLANT
SUT VIC AB 1 CT1 27 (SUTURE) ×6
SUT VIC AB 1 CT1 27XBRD ANBCTR (SUTURE) ×2 IMPLANT
SUT VIC AB 2-0 CT1 27 (SUTURE) ×3
SUT VIC AB 2-0 CT1 TAPERPNT 27 (SUTURE) IMPLANT
SUT VIC AB 2-0 CTB1 (SUTURE) ×6 IMPLANT
SYR 50ML LL SCALE MARK (SYRINGE) ×3 IMPLANT
TOWEL OR 17X24 6PK STRL BLUE (TOWEL DISPOSABLE) ×3 IMPLANT
TOWEL OR 17X26 10 PK STRL BLUE (TOWEL DISPOSABLE) ×3 IMPLANT
TRAY FOLEY CATH 16FRSI W/METER (SET/KITS/TRAYS/PACK) IMPLANT
WRAP KNEE MAXI GEL POST OP (GAUZE/BANDAGES/DRESSINGS) ×3 IMPLANT

## 2016-03-05 NOTE — Anesthesia Preprocedure Evaluation (Addendum)
Anesthesia Evaluation  Patient identified by MRN, date of birth, ID band Patient awake    Reviewed: Allergy & Precautions, H&P , NPO status , Patient's Chart, lab work & pertinent test results, reviewed documented beta blocker date and time   Airway Mallampati: II  TM Distance: >3 FB Neck ROM: Full    Dental no notable dental hx. (+) Teeth Intact, Dental Advisory Given   Pulmonary neg pulmonary ROS, PE   Pulmonary exam normal breath sounds clear to auscultation       Cardiovascular hypertension, Pt. on medications and Pt. on home beta blockers + Peripheral Vascular Disease and + DVT   Rhythm:Regular Rate:Normal     Neuro/Psych  Headaches, negative psych ROS   GI/Hepatic Neg liver ROS, GERD  Medicated and Controlled,  Endo/Other  negative endocrine ROS  Renal/GU negative Renal ROS  negative genitourinary   Musculoskeletal  (+) Arthritis , Osteoarthritis,    Abdominal   Peds  Hematology negative hematology ROS (+)   Anesthesia Other Findings   Reproductive/Obstetrics negative OB ROS                            Anesthesia Physical Anesthesia Plan  ASA: III  Anesthesia Plan: General and Regional   Post-op Pain Management: GA combined w/ Regional for post-op pain   Induction: Intravenous  Airway Management Planned: LMA  Additional Equipment:   Intra-op Plan:   Post-operative Plan: Extubation in OR  Informed Consent: I have reviewed the patients History and Physical, chart, labs and discussed the procedure including the risks, benefits and alternatives for the proposed anesthesia with the patient or authorized representative who has indicated his/her understanding and acceptance.   Dental advisory given  Plan Discussed with: CRNA  Anesthesia Plan Comments:        Anesthesia Quick Evaluation

## 2016-03-05 NOTE — Discharge Instructions (Signed)

## 2016-03-05 NOTE — Progress Notes (Signed)
Orthopedic Tech Progress Note Patient Details:  Jeremy [name] 1961/02/22 [id number]  CPM Left Knee CPM Left Knee: On Left Knee Flexion (Degrees): 60 Left Knee Extension (Degrees): 0 Additional Comments: Trapeze bar   [name] 03/05/2016, 3:49 PM

## 2016-03-05 NOTE — Progress Notes (Signed)
ANTICOAGULATION CONSULT NOTE - Initial Consult  Pharmacy Consult for lovenox/coumadin Indication: hx of DVT/PE  Allergies  Allergen Reactions  . Morphine And Related Nausea Only    Patient Measurements: Height: 6' (182.9 cm) Weight: 217 lb 13 oz (98.799 kg) IBW/kg (Calculated) : 77.6   Vital Signs: Temp: 98 F (36.7 C) (04/03 1650) Temp Source: Oral (04/03 1106) BP: 118/68 mmHg (04/03 1650) Pulse Rate: 93 (04/03 1650)  Labs:  Recent Labs  03/05/16 1104  LABPROT 14.6  INR 1.12    Estimated Creatinine Clearance: 90.2 mL/min (by C-G formula based on Cr of 1.1).   Medical History: Past Medical History  Diagnosis Date  . PE (pulmonary embolism) 1992    post op- 1 week  . Peripheral vascular disease ([place])     post op DVT-  approx. 2009, treated at [facility]   . Hypertension   . Seasonal allergies   . H/O renal calculi     has been passed spontaneously but also had cystoscopy for one   . GERD (gastroesophageal reflux disease)   . Headache     migraines on occasion - used imitrex 5- 6 yrs. ago   . Arthritis     both knees     Assessment: 56 YOM with hx of DVT/PE on chronic coumadin, s/p L TKA, pharmacy is consulted to restart coumadin with lovenox bridge. Baseline hgb 16.6, plt 187K, scr 1.1, est. crcl ~ 90 ml/min. INR 1.12 today.  Coumadin PTA dose: 3mg  daily  Goal of Therapy:  INR 2-3 Anti-Xa level 0.6-1 units/ml 4hrs after LMWH dose given Monitor platelets by anticoagulation protocol: Yes   Plan:  Coumadin 5mg  po x 1 tonight Daily PT/INR Lovenox 100 mg sq Q 12 hrs first dose tonight at 2200 Monitor CBC  Maryanna Shape, PharmD, BCPS  Clinical Pharmacist  Pager: [phone]   03/05/2016,5:14 PM

## 2016-03-05 NOTE — Brief Op Note (Signed)
03/05/2016  2:19 PM  PATIENT:  Jeremy [name]  56 y.o. male  PRE-OPERATIVE DIAGNOSIS:  Osteoarthritis left knee  POST-OPERATIVE DIAGNOSIS:  Osteoarthritis left knee  PROCEDURE:  Procedure(s): LEFT TOTAL KNEE ARTHROPLASTY (Left)  SURGEON:  Surgeon(s) and Role:    * Dorna Leitz, MD - Primary  PHYSICIAN ASSISTANT:   ASSISTANTS: bethune   ANESTHESIA:   general  EBL:  Total I/O In: 1000 [I.V.:1000] Out: -   BLOOD ADMINISTERED:none  DRAINS: (1 med) Hemovact drain(s) in the l knee with  Suction Open   LOCAL MEDICATIONS USED:  OTHER experel  SPECIMEN:  No Specimen  DISPOSITION OF SPECIMEN:  N/A  COUNTS:  YES  TOURNIQUET:   Total Tourniquet Time Documented: Thigh (Left) - 61 minutes Total: Thigh (Left) - 61 minutes   DICTATION: .Other Dictation: Dictation Number [id number]  PLAN OF CARE: Admit to inpatient   PATIENT DISPOSITION:  PACU - hemodynamically stable.   Delay start of Pharmacological VTE agent (>24hrs) due to surgical blood loss or risk of bleeding: no

## 2016-03-05 NOTE — H&P (Signed)
TOTAL KNEE ADMISSION H&P  Patient is being admitted for left total knee arthroplasty.  Subjective:  Chief Complaint:left knee pain.  HPI: Jeremy [name], 55 y.o. male, has a history of pain and functional disability in the left knee due to arthritis and has failed non-surgical conservative treatments for greater than 12 weeks to includeNSAID's and/or analgesics, corticosteriod injections, viscosupplementation injections, weight reduction as appropriate and activity modification.  Onset of symptoms was gradual, starting 5 years ago with gradually worsening course since that time. The patient noted prior procedures on the knee to include  arthroscopy and menisectomy on the left knee(s).  Patient currently rates pain in the left knee(s) at 9 out of 10 with activity. Patient has night pain, worsening of pain with activity and weight bearing, pain that interferes with activities of daily living, pain with passive range of motion and joint swelling.  Patient has evidence of subchondral cysts, subchondral sclerosis and joint space narrowing by imaging studies. This patient has had failure of all reasonable conservative care. There is no active infection.  There are no active problems to display for this patient.  Past Medical History  Diagnosis Date  . PE (pulmonary embolism) 1992    post op- 1 week  . Peripheral vascular disease ([place])     post op DVT-  approx. 2009, treated at [facility]   . Hypertension   . Seasonal allergies   . H/O renal calculi     has been passed spontaneously but also had cystoscopy for one   . GERD (gastroesophageal reflux disease)   . Headache     migraines on occasion - used imitrex 5- 6 yrs. ago   . Arthritis     both knees     Past Surgical History  Procedure Laterality Date  . Knee surgery      8 arthroscopies & one repair of tendon- R   . Ankle surgery Right     repair of fracture & repair of torn tendons.  . Vocal cord injection      bilateral medialization,  implants on both vocal chords   . Tonsillectomy      Prescriptions prior to admission  Medication Sig Dispense Refill Last Dose  . aspirin 81 MG EC tablet Take 81 mg by mouth every morning. Swallow whole.   03/04/2016 at Unknown time  . clidinium-chlordiazePOXIDE (LIBRAX) 5-2.5 MG capsule Take 1 capsule by mouth 3 (three) times daily before meals.   03/04/2016 at Unknown time  . Enoxaparin Sodium (LOVENOX IJ) Inject 150 mg as directed. Coumadin bridge   03/04/2016 at 2000  . hydrochlorothiazide (HYDRODIURIL) 25 MG tablet Take 25 mg by mouth every morning.   03/04/2016 at Unknown time  . metoprolol succinate (TOPROL-XL) 25 MG 24 hr tablet Take 25 mg by mouth at bedtime.   03/04/2016 at 2000  . Omega-3 Fatty Acids (FISH OIL) 1000 MG CAPS Take 1,000 mg by mouth 2 (two) times daily.   02/29/2016  . omeprazole (PRILOSEC) 20 MG capsule Take 20-40 mg by mouth 2 (two) times daily before a meal. 40 mg every morning and 20 mg every evening   03/05/2016 at Unknown time  . warfarin (COUMADIN) 3 MG tablet Take 3 mg by mouth daily at 6 PM.   02/29/2016   Allergies  Allergen Reactions  . Morphine And Related Nausea Only    Social History  Substance Use Topics  . Smoking status: Never Smoker   . Smokeless tobacco: Not on file  . Alcohol Use:  No    History reviewed. No pertinent family history.   ROS ROS: I have reviewed the patient's review of systems thoroughly and there are no positive responses as relates to the HPI. Objective:  Physical Exam  Vital signs in last 24 hours:   Well-developed well-nourished patient in no acute distress. Alert and oriented x3 HEENT:within normal limits Cardiac: Regular rate and rhythm Pulmonary: Lungs clear to auscultation Abdomen: Soft and nontender.  Normal active bowel sounds  Musculoskeletal: (Left knee: Medial joint line tenderness.  Pain to range of motion.  No instability.  Range of motion is 5 to the 100. Labs: Recent Results (from the past 2160 hour(s))   Urinalysis, Routine w reflex microscopic (not at [facility])     Status: None   Collection Time: 02/29/16  4:39 PM  Result Value Ref Range   Color, Urine YELLOW YELLOW   APPearance CLEAR CLEAR   Specific Gravity, Urine 1.011 1.005 - 1.030   pH 5.0 5.0 - 8.0   Glucose, UA NEGATIVE NEGATIVE mg/dL   Hgb urine dipstick NEGATIVE NEGATIVE   Bilirubin Urine NEGATIVE NEGATIVE   Ketones, ur NEGATIVE NEGATIVE mg/dL   Protein, ur NEGATIVE NEGATIVE mg/dL   Nitrite NEGATIVE NEGATIVE   Leukocytes, UA NEGATIVE NEGATIVE    Comment: MICROSCOPIC NOT DONE ON URINES WITH NEGATIVE PROTEIN, BLOOD, LEUKOCYTES, NITRITE, OR GLUCOSE <1000 mg/dL.  Surgical pcr screen     Status: None   Collection Time: 02/29/16  4:46 PM  Result Value Ref Range   MRSA, PCR NEGATIVE NEGATIVE   Staphylococcus aureus NEGATIVE NEGATIVE    Comment:        The Xpert SA Assay (FDA approved for NASAL specimens in patients over 53 years of age), is one component of a comprehensive surveillance program.  Test performance has been validated by [facility] for patients greater than or equal to 57 year old. It is not intended to diagnose infection nor to guide or monitor treatment.   Type and screen Order type and screen if day of surgery is less than 15 days from draw of preadmission visit or order morning of surgery if day of surgery is greater than 6 days from preadmission visit.     Status: None   Collection Time: 02/29/16  4:56 PM  Result Value Ref Range   ABO/RH(D) A POS    Antibody Screen NEG    Sample Expiration 03/14/2016    Extend sample reason NO TRANSFUSIONS OR PREGNANCY IN THE PAST 3 MONTHS   ABO/Rh     Status: None   Collection Time: 02/29/16  4:56 PM  Result Value Ref Range   ABO/RH(D) A POS   APTT     Status: None   Collection Time: 02/29/16  5:03 PM  Result Value Ref Range   aPTT 32 24 - 37 seconds  CBC WITH DIFFERENTIAL     Status: None   Collection Time: 02/29/16  5:03 PM  Result Value Ref Range   WBC 8.7  4.0 - 10.5 K/uL   RBC 5.28 4.22 - 5.81 MIL/uL   Hemoglobin 16.6 13.0 - 17.0 g/dL   HCT 46.5 39.0 - 52.0 %   MCV 88.1 78.0 - 100.0 fL   MCH 31.4 26.0 - 34.0 pg   MCHC 35.7 30.0 - 36.0 g/dL   RDW 13.2 11.5 - 15.5 %   Platelets 187 150 - 400 K/uL   Neutrophils Relative % 53 %   Neutro Abs 4.7 1.7 - 7.7 K/uL   Lymphocytes Relative  37 %   Lymphs Abs 3.2 0.7 - 4.0 K/uL   Monocytes Relative 8 %   Monocytes Absolute 0.7 0.1 - 1.0 K/uL   Eosinophils Relative 1 %   Eosinophils Absolute 0.1 0.0 - 0.7 K/uL   Basophils Relative 1 %   Basophils Absolute 0.1 0.0 - 0.1 K/uL  Comprehensive metabolic panel     Status: Abnormal   Collection Time: 02/29/16  5:03 PM  Result Value Ref Range   Sodium 140 135 - 145 mmol/L   Potassium 3.8 3.5 - 5.1 mmol/L    Comment: HEMOLYSIS AT THIS LEVEL MAY AFFECT RESULT   Chloride 101 101 - 111 mmol/L   CO2 30 22 - 32 mmol/L   Glucose, Bld 109 (H) 65 - 99 mg/dL   BUN 19 6 - 20 mg/dL   Creatinine, Ser 1.10 0.61 - 1.24 mg/dL   Calcium 9.6 8.9 - 10.3 mg/dL   Total Protein 7.3 6.5 - 8.1 g/dL   Albumin 4.0 3.5 - 5.0 g/dL   AST 26 15 - 41 U/L   ALT 27 17 - 63 U/L   Alkaline Phosphatase 53 38 - 126 U/L   Total Bilirubin 1.0 0.3 - 1.2 mg/dL   GFR calc non Af Amer >60 >60 mL/min   GFR calc Af Amer >60 >60 mL/min    Comment: (NOTE) The eGFR has been calculated using the CKD EPI equation. This calculation has not been validated in all clinical situations. eGFR's persistently <60 mL/min signify possible Chronic Kidney Disease.    Anion gap 9 5 - 15  Protime-INR     Status: Abnormal   Collection Time: 02/29/16  5:03 PM  Result Value Ref Range   Prothrombin Time 21.7 (H) 11.6 - 15.2 seconds   INR 1.90 (H) 0.00 - 1.49    There is no height or weight on file to calculate BMI.   Imaging Review Plain radiographs demonstrate severe degenerative joint disease of the left knee(s). The overall alignment ismild varus. The bone quality appears to be good for age and  reported activity level.  Assessment/Plan:  End stage arthritis, left knee   The patient history, physical examination, clinical judgment of the provider and imaging studies are consistent with end stage degenerative joint disease of the left knee(s) and total knee arthroplasty is deemed medically necessary. The treatment options including medical management, injection therapy arthroscopy and arthroplasty were discussed at length. The risks and benefits of total knee arthroplasty were presented and reviewed. The risks due to aseptic loosening, infection, stiffness, patella tracking problems, thromboembolic complications and other imponderables were discussed. The patient acknowledged the explanation, agreed to proceed with the plan and consent was signed. Patient is being admitted for inpatient treatment for surgery, pain control, PT, OT, prophylactic antibiotics, VTE prophylaxis, progressive ambulation and ADL's and discharge planning. The patient is planning to be discharged home with home health services

## 2016-03-05 NOTE — Anesthesia Postprocedure Evaluation (Signed)
Anesthesia Post Note  Patient: Jeremy [name]  Procedure(s) Performed: Procedure(s) (LRB): LEFT TOTAL KNEE ARTHROPLASTY (Left)  Patient location during evaluation: PACU Anesthesia Type: General and Regional Level of consciousness: awake and alert Pain management: pain level controlled Vital Signs Assessment: post-procedure vital signs reviewed and stable Respiratory status: spontaneous breathing, nonlabored ventilation and respiratory function stable Cardiovascular status: blood pressure returned to baseline and stable Postop Assessment: no signs of nausea or vomiting Anesthetic complications: no    Last Vitals:  Filed Vitals:   03/05/16 1545 03/05/16 1610  BP: 146/68 122/80  Pulse: 90 72  Temp:    Resp: 15 10    Last Pain:  Filed Vitals:   03/05/16 1620  PainSc: Asleep                 Alaijah Gibler,W. [facility]MON[facility]

## 2016-03-05 NOTE — Anesthesia Procedure Notes (Addendum)
Anesthesia Regional Block:  Femoral nerve block  Pre-Anesthetic Checklist: ,, timeout performed, Correct Patient, Correct Site, Correct Laterality, Correct Procedure, Correct Position, site marked, Risks and benefits discussed, pre-op evaluation,  At surgeon's request and post-op pain management  Laterality: Left  Prep: Maximum Sterile Barrier Precautions used and chloraprep       Needles:  Injection technique: Single-shot  Needle Type: Echogenic Stimulator Needle     Needle Length: 5cm 5 cm Needle Gauge: 22 and 22 G    Additional Needles:  Procedures: ultrasound guided (picture in chart) Femoral nerve block  Nerve Stimulator or Paresthesia:  Response: Patellar respose,   Additional Responses:   Narrative:  Start time: 03/05/2016 12:00 PM End time: 03/05/2016 12:10 PM Injection made incrementally with aspirations every 5 mL. Anesthesiologist: Roderic Palau  Additional Notes: 2% Lidocaine skin wheel.    Procedure Name: LMA Insertion Date/Time: 03/05/2016 12:38 PM Performed by: Gaylene Brooks Pre-anesthesia Checklist: Patient identified, Emergency Drugs available, Suction available and Patient being monitored Patient Re-evaluated:Patient Re-evaluated prior to inductionOxygen Delivery Method: Circle System Utilized Preoxygenation: Pre-oxygenation with 100% oxygen Intubation Type: IV induction Ventilation: Mask ventilation without difficulty LMA: LMA inserted LMA Size: 5.0 Number of attempts: 1 Placement Confirmation: positive ETCO2 Tube secured with: Tape Dental Injury: Teeth and Oropharynx as per pre-operative assessment

## 2016-03-05 NOTE — Transfer of Care (Signed)
Immediate Anesthesia Transfer of Care Note  Patient: Jeremy [name]  Procedure(s) Performed: Procedure(s): LEFT TOTAL KNEE ARTHROPLASTY (Left)  Patient Location: PACU  Anesthesia Type:General  Level of Consciousness: awake, alert  and oriented  Airway & Oxygen Therapy: Patient Spontanous Breathing and Patient connected to nasal cannula oxygen  Post-op Assessment: Report given to RN, Post -op Vital signs reviewed and stable and Patient moving all extremities X 4  Post vital signs: Reviewed and stable  Last Vitals:  Filed Vitals:   03/05/16 1217 03/05/16 1453  BP: 134/79 141/89  Pulse:  81  Temp:  36.9 C  Resp:  12    Complications: No apparent anesthesia complications

## 2016-03-06 ENCOUNTER — Encounter (HOSPITAL_COMMUNITY): Payer: Self-pay | Admitting: *Deleted

## 2016-03-06 DIAGNOSIS — Z86711 Personal history of pulmonary embolism: Secondary | ICD-10-CM | POA: Diagnosis present

## 2016-03-06 LAB — CBC
HCT: 38.2 % — ABNORMAL LOW (ref 39.0–52.0)
HEMOGLOBIN: 13.1 g/dL (ref 13.0–17.0)
MCH: 30.3 pg (ref 26.0–34.0)
MCHC: 34.3 g/dL (ref 30.0–36.0)
MCV: 88.4 fL (ref 78.0–100.0)
Platelets: 170 10*3/uL (ref 150–400)
RBC: 4.32 MIL/uL (ref 4.22–5.81)
RDW: 13.2 % (ref 11.5–15.5)
WBC: 12.6 10*3/uL — AB (ref 4.0–10.5)

## 2016-03-06 LAB — BASIC METABOLIC PANEL
ANION GAP: 10 (ref 5–15)
BUN: 15 mg/dL (ref 6–20)
CHLORIDE: 101 mmol/L (ref 101–111)
CO2: 28 mmol/L (ref 22–32)
Calcium: 8.4 mg/dL — ABNORMAL LOW (ref 8.9–10.3)
Creatinine, Ser: 0.99 mg/dL (ref 0.61–1.24)
GFR calc Af Amer: >60 mL/min (ref >60)
GFR calc non Af Amer: >60 mL/min (ref >60)
GLUCOSE: 133 mg/dL — AB (ref 65–99)
POTASSIUM: 4 mmol/L (ref 3.5–5.1)
SODIUM: 139 mmol/L (ref 135–145)

## 2016-03-06 LAB — PROTIME-INR
INR: 1.24 (ref 0.00–1.49)
PROTHROMBIN TIME: 15.7 s — AB (ref 11.6–15.2)

## 2016-03-06 MED ORDER — WARFARIN SODIUM 3 MG PO TABS
3.0000 mg | ORAL_TABLET | Freq: Once | ORAL | Status: AC
  Administered 2016-03-06: 3 mg via ORAL
  Filled 2016-03-06: qty 1

## 2016-03-06 MED ORDER — ENOXAPARIN SODIUM 150 MG/ML ~~LOC~~ SOLN
1.5000 mg/kg | SUBCUTANEOUS | Status: DC
  Filled 2016-03-06: qty 0.99

## 2016-03-06 MED ORDER — ENOXAPARIN SODIUM 150 MG/ML ~~LOC~~ SOLN: 1.5000 mg/kg | SUBCUTANEOUS | Status: DC

## 2016-03-06 NOTE — Progress Notes (Signed)
Physical Therapy Treatment Patient Details Name: Jeremy [name] MRN: [id number] DOB: Apr 20, 1961 Today's Date: 03/06/2016    History of Present Illness pt admitted for left TKA with PMHx of HTN, bilateral knee arthritis, PE, PVD, GERD    PT Comments    Pt pleasant and willing to work with therapy. Pt still unable to complete SLR without assistance, so pt was instructed to use knee immobilizer until he is able to complete an SLR. Pt has progressed from AM session and has met mobility for safe discharge home.  Follow Up Recommendations  Home health PT     Equipment Recommendations       Recommendations for Other Services       Precautions / Restrictions Precautions Precautions: Knee Required Braces or Orthoses: Knee Immobilizer - Left Knee Immobilizer - Left: On when out of bed or walking Restrictions Weight Bearing Restrictions: Yes LLE Weight Bearing: Weight bearing as tolerated    Mobility  Bed Mobility Overal bed mobility: Needs Assistance Bed Mobility: Supine to Sit;Sit to Supine     Supine to sit: Supervision Sit to supine: Supervision   General bed mobility comments: supervision for safety  Transfers Overall transfer level: Needs assistance Equipment used: Rolling walker (2 wheeled) Transfers: Sit to/from Stand Sit to Stand: Modified independent (Device/Increase time)         General transfer comment: min guard for safety  Ambulation/Gait Ambulation/Gait assistance: Supervision Ambulation Distance (Feet): 300 Feet Assistive device: Rolling walker (2 wheeled) Gait Pattern/deviations: Step-through pattern;Decreased stride length   Gait velocity interpretation: Below normal speed for age/gender General Gait Details: cues for relaxing shoulders and educated on position in walker   Stairs Stairs: Yes Stairs assistance: Min assist Stair Management: No rails;Backwards;With walker Number of Stairs: 2 General stair comments: educated pt and wife on  position of walker and of wife while pt ascends and descends stairs, as well as hand position when ascending stairs. handout provided.  Wheelchair Mobility    Modified Rankin (Stroke Patients Only)       Balance                                    Cognition Arousal/Alertness: Awake/alert Behavior During Therapy: WFL for tasks assessed/performed Overall Cognitive Status: Within Functional Limits for tasks assessed                      Exercises Total Joint Exercises Short Arc Quad: AAROM;Left;10 reps;Supine Heel Slides: AROM;Left;10 reps;Supine Hip ABduction/ADduction: AROM;Left;10 reps;Supine Straight Leg Raises: AAROM;Left;10 reps;Supine Goniometric ROM: 14-71    General Comments        Pertinent Vitals/Pain Pain Assessment: 0-10 Pain Score: 5  Pain Location: left knee Pain Descriptors / Indicators: Aching Pain Intervention(s): Monitored during session;Limited activity within patient's tolerance    Home Living                      Prior Function            PT Goals (current goals can now be found in the care plan section) Progress towards PT goals: Progressing toward goals    Frequency       PT Plan Current plan remains appropriate    Co-evaluation             End of Session Equipment Utilized During Treatment: Gait belt Activity Tolerance: Patient tolerated treatment well Patient left: in bed;with call  bell/phone within reach;with family/visitor present     Time: 1151-1222 PT Time Calculation (min) (ACUTE ONLY): 31 min  Charges:  $Gait Training: 8-22 mins $Therapeutic Exercise: 8-22 mins                    G Codes:      Haze Justin March 23, 2016, 1:02 PM   Haze Justin, [place]

## 2016-03-06 NOTE — Progress Notes (Addendum)
Bruno for lovenox/coumadin Indication: hx of DVT/PE  Allergies  Allergen Reactions  . Morphine And Related Nausea Only    Patient Measurements: Height: 6' (182.9 cm) Weight: 217 lb 13 oz (98.799 kg) IBW/kg (Calculated) : 77.6   Vital Signs: Temp: 98.1 F (36.7 C) (04/04 0500) BP: 91/61 mmHg (04/04 0500) Pulse Rate: 69 (04/04 0500)  Labs:  Recent Labs  03/05/16 1104 03/06/16 0538  HGB  --  13.1  HCT  --  38.2*  PLT  --  170  LABPROT 14.6 15.7*  INR 1.12 1.24  CREATININE  --  0.99    Estimated Creatinine Clearance: 100.3 mL/min (by C-G formula based on Cr of 0.99).   Medical History: Past Medical History  Diagnosis Date  . PE (pulmonary embolism) 1992    post op- 1 week  . Peripheral vascular disease ([place])     post op DVT-  approx. 2009, treated at [facility]   . Hypertension   . Seasonal allergies   . H/O renal calculi     has been passed spontaneously but also had cystoscopy for one   . GERD (gastroesophageal reflux disease)   . Headache     migraines on occasion - used imitrex 5- 6 yrs. ago   . Arthritis     both knees     Assessment: 2 YOM with hx of DVT/PE on chronic coumadin, s/p L TKA, pharmacy is consulted to restart coumadin with lovenox bridge. Baseline hgb 16.6, plt 187K, INR 1.12>>1.24. Pt was on lovenox 1.5mg /kg/day prior to admission and still has some doses left. Will discontinue lovenox 1mg /kg q12h and resume home regimen.  Coumadin PTA dose: 3mg  daily  Goal of Therapy:  INR 2-3 Anti-Xa level 0.6-1 units/ml 4hrs after LMWH dose given Monitor platelets by anticoagulation protocol: Yes   Plan:  Coumadin 3mg  tonight x1 Lovenox 1.5mg /kg subcutaneously q24h Daily PT/INR Monitor CBC   Andrey Cota. Diona Foley, PharmD, [place] Clinical Pharmacist Pager [phone] 03/06/2016,10:23 AM

## 2016-03-06 NOTE — Progress Notes (Signed)
Subjective: 1 Day Post-Op Procedure(s) (LRB): LEFT TOTAL KNEE ARTHROPLASTY (Left) Patient reports pain as moderate. Taking by mouth and voiding okay. No chest pain or shortness of breath.  Objective: Vital signs in last 24 hours: Temp:  [98 F (36.7 C)-98.5 F (36.9 C)] 98.1 F (36.7 C) (04/04 0500) Pulse Rate:  [69-93] 69 (04/04 0500) Resp:  [7-20] 16 (04/03 2100) BP: (91-161)/(61-108) 91/61 mmHg (04/04 0500) SpO2:  [90 %-100 %] 96 % (04/04 0500) Weight:  [98.799 kg (217 lb 13 oz)] 98.799 kg (217 lb 13 oz) (04/03 1106)  Intake/Output from previous day: 04/03 0701 - 04/04 0700 In: 1600 [P.O.:600; I.V.:1000] Out: 850 [Urine:550; Drains:200; Blood:100] Intake/Output this shift:     Recent Labs  03/06/16 0538  HGB 13.1    Recent Labs  03/06/16 0538  WBC 12.6*  RBC 4.32  HCT 38.2*  PLT 170    Recent Labs  03/06/16 0538  NA 139  K 4.0  CL 101  CO2 28  BUN 15  CREATININE 0.99  GLUCOSE 133*  CALCIUM 8.4*    Recent Labs  03/05/16 1104 03/06/16 0538  INR 1.12 1.24   Left knee exam: Hemovac drain is intact. Dressing is clean and dry. 200 mL of drainage in Hemovac. Neurologically intact Sensation intact distally Intact pulses distally Dorsiflexion/Plantar flexion intact Incision: dressing C/D/I Compartment soft  Assessment/Plan: 1 Day Post-Op Procedure(s) (LRB): LEFT TOTAL KNEE ARTHROPLASTY (Left) History of DVT in remote past. On chronic Coumadin. Plan: We have resumed Coumadin 3 mg daily which is his regular dose. He is on Lovenox bridging to Coumadin. At home he had Lovenox 150 mg subcutaneous daily 4 doses he has 6 vials left. If he does well with physical therapy today. He can be discharged home. He will use the Lovenox 150 mg subcutaneous daily until his INR is therapeutic between 2-3. I have ordered a pro time/INR for Thursday. Hemovac drain is pulled today. Weight-bear as tolerated on left. Encouraged him to wear his TED hose most of the  time. Up with therapy Discharge home with home health He will need a follow-up with Dr. Berenice Primas in 2 weeks. [place] G 03/06/2016, 8:17 AM

## 2016-03-06 NOTE — Progress Notes (Signed)
Orthopedic Tech Progress Note Patient Details:  Jeremy [name] 18-Aug-1961 [id number]  CPM Left Knee CPM Left Knee: On Left Knee Flexion (Degrees): 60 Left Knee Extension (Degrees): 0 Additional Comments: Trapeze bar   [name] 03/06/2016, 4:38 PM

## 2016-03-06 NOTE — Discharge Summary (Signed)
Patient ID: Jeremy [name] MRN: [id number] DOB/AGE: 1960-12-06 55 y.o.  Admit date: 03/05/2016 Discharge date: 03/06/2016  Admission Diagnoses:  Principal Problem:   Primary osteoarthritis of left knee Active Problems:   History of pulmonary embolism   Discharge Diagnoses:  Same  Past Medical History  Diagnosis Date  . PE (pulmonary embolism) 1992    post op- 1 week  . Peripheral vascular disease ([place])     post op DVT-  approx. 2009, treated at [facility]   . Hypertension   . Seasonal allergies   . H/O renal calculi     has been passed spontaneously but also had cystoscopy for one   . GERD (gastroesophageal reflux disease)   . Headache     migraines on occasion - used imitrex 5- 6 yrs. ago   . Arthritis     both knees     Surgeries: Procedure(s): LEFT TOTAL KNEE ARTHROPLASTY on 03/05/2016    Discharged Condition: Improved  Hospital Course: Jeremy [name] is an 54 y.o. male who was admitted 03/05/2016 for operative treatment ofPrimary osteoarthritis of left knee. Patient has severe unremitting pain that affects sleep, daily activities, and work/hobbies. After pre-op clearance the patient was taken to the operating room on 03/05/2016 and underwent  Procedure(s): LEFT TOTAL KNEE ARTHROPLASTY.    Patient was given perioperative antibiotics:      Anti-infectives    Start     Dose/Rate Route Frequency Ordered Stop   03/05/16 1830  ceFAZolin (ANCEF) IVPB 2g/100 mL premix     2 g 200 mL/hr over 30 Minutes Intravenous Every 6 hours 03/05/16 1701 03/06/16 0045   03/05/16 1200  ceFAZolin (ANCEF) IVPB 2g/100 mL premix     2 g 200 mL/hr over 30 Minutes Intravenous To ShortStay Surgical 03/04/16 1530 03/05/16 1241       Patient was given sequential compression devices, early ambulation, and chemoprophylaxis to prevent DVT.The  patient wasresumed on his preoperative Coumadin 3 mg daily and was started immediately postoperatively on subcutaneous Lovenox until his Coumadin became  therapeutic.  The patient did well during the hospitalization.  He made excellent progress with physical therapy.  He was afebrile, his vital signs are stable.  He had no sign of chest pain or shortness of breath.  In the afternoon of postoperative day #1 the patient was doing well and was ready to be discharged home.  Patient benefited maximally from hospital stay and there were no complications.    Recent vital signs:  Patient Vitals for the past 24 hrs:  BP Temp Pulse Resp SpO2  03/06/16 0500 91/61 mmHg 98.1 F (36.7 C) 69 - 96 %  03/06/16 0100 134/78 mmHg 98.1 F (36.7 C) 90 - 96 %  03/05/16 2100 128/80 mmHg 98.5 F (36.9 C) 89 16 95 %     Recent laboratory studies:   Recent Labs  03/05/16 1104 03/06/16 0538  WBC  --  12.6*  HGB  --  13.1  HCT  --  38.2*  PLT  --  170  NA  --  139  K  --  4.0  CL  --  101  CO2  --  28  BUN  --  15  CREATININE  --  0.99  GLUCOSE  --  133*  INR 1.12 1.24  CALCIUM  --  8.4*     Discharge Medications:     Medication List    TAKE these medications        aspirin 81 MG  EC tablet  Take 81 mg by mouth every morning. Swallow whole.     clidinium-chlordiazePOXIDE 5-2.5 MG capsule  Commonly known as:  LIBRAX  Take 1 capsule by mouth 3 (three) times daily before meals.     enoxaparin 150 MG/ML injection  Commonly known as:  LOVENOX  Inject 0.99 mLs (150 mg total) into the skin daily.     Fish Oil 1000 MG Caps  Take 1,000 mg by mouth 2 (two) times daily.     hydrochlorothiazide 25 MG tablet  Commonly known as:  HYDRODIURIL  Take 25 mg by mouth every morning.     methocarbamol 750 MG tablet  Commonly known as:  ROBAXIN-750  Take 1 tablet (750 mg total) by mouth every 8 (eight) hours as needed for muscle spasms.     metoprolol succinate 25 MG 24 hr tablet  Commonly known as:  TOPROL-XL  Take 25 mg by mouth at bedtime.     omeprazole 20 MG capsule  Commonly known as:  PRILOSEC  Take 20-40 mg by mouth 2 (two) times daily  before a meal. 40 mg every morning and 20 mg every evening     oxyCODONE-acetaminophen 5-325 MG tablet  Commonly known as:  PERCOCET/ROXICET  Take 1-2 tablets by mouth every 4 (four) hours as needed for severe pain.     warfarin 3 MG tablet  Commonly known as:  COUMADIN  Take 3 mg by mouth daily at 6 PM.        Diagnostic Studies: Dg Chest 2 View  03/01/2016  CLINICAL DATA:  Preop for total knee arthroplasty. EXAM: CHEST  2 VIEW COMPARISON:  None available. FINDINGS: The cardiac silhouette, mediastinal and hilar contours are within normal limits. There is mild tortuosity of the thoracic aorta. Prominent epicardial fat is noted. No acute pulmonary findings. No pleural effusion. The bony thorax is intact. IMPRESSION: No acute cardiopulmonary findings. Electronically Signed   By: Marijo Sanes M.D.   On: 03/01/2016 08:12    Disposition: 01-Home or Self Care  Discharge Instructions    CPM    Complete by:  As directed   Continuous passive motion machine (CPM):      Use the CPM from 0 to 60 for 8 hours per day.      You may increase by 5-10 per day.  You may break it up into 2 or 3 sessions per day.      Use CPM for 1-2 weeks or until you are told to stop.     Call MD / Call 911    Complete by:  As directed   If you experience chest pain or shortness of breath, CALL 911 and be transported to the hospital emergency room.  If you develope a fever above 101 F, pus (white drainage) or increased drainage or redness at the wound, or calf pain, call your surgeon's office.     Constipation Prevention    Complete by:  As directed   Drink plenty of fluids.  Prune juice may be helpful.  You may use a stool softener, such as Colace (over the counter) 100 mg twice a day.  Use MiraLax (over the counter) for constipation as needed.     Diet general    Complete by:  As directed      Do not put a pillow under the knee. Place it under the heel.    Complete by:  As directed      Increase activity  slowly as tolerated  Complete by:  As directed      Weight bearing as tolerated    Complete by:  As directed   Laterality:  left  Extremity:  Lower     Weight bearing as tolerated    Complete by:  As directed   Laterality:  left  Extremity:  Lower         The Patient is scheduled for a home health RN to check his protime/INR in 2 days.  They will call the results to [place] at [phone].He will continue on subcutaneous Lovenox until his Coumadin is therapeutic with an INR of 2-3.  Follow-up Information    Follow up with GRAVES,JOHN L, MD. Schedule an appointment as soon as possible for a visit in 2 weeks.   Specialty:  Orthopedic Surgery   Contact information:   [name] [address] [phone]       Follow up with [facility].   Why:  They will contact you to schedule home therapy visits.   Contact information:   [address] [place] [address] [phone]        Signed: Erlene Senters 03/06/2016, 5:40 PM

## 2016-03-06 NOTE — Evaluation (Signed)
Physical Therapy Evaluation Patient Details Name: Jeremy [name] MRN: [id number] DOB: [birth year] Today's Date: 03/06/2016   History of Present Illness  pt admitted for left TKA with PMHx of HTN, bilateral knee arthritis, PE, PVD, GERD  Clinical Impression  Pt was pleasant and willing to work with therapy this morning. Pt stated that he wants to get better quickly because  has several grandchildren that he needs to help take care of. Pt moved well with minimal cues, but is limited in functional mobility due to deficits in strength and ROM. Pt unable to perform SLR, so knee immobilizer was used during ambulation. Pt will benefit from therapy in order to maximize his independence. Recommendations for follow up therapy and equipment listed below.    Follow Up Recommendations Home health PT    Equipment Recommendations  Rolling walker with 5" wheels;3in1 (PT)    Recommendations for Other Services OT consult     Precautions / Restrictions Precautions Precautions: Knee Required Braces or Orthoses: Knee Immobilizer - Left Knee Immobilizer - Left: On when out of bed or walking Restrictions Weight Bearing Restrictions: Yes LLE Weight Bearing: Weight bearing as tolerated      Mobility  Bed Mobility Overal bed mobility: Needs Assistance Bed Mobility: Supine to Sit     Supine to sit: Supervision     General bed mobility comments: supervision for safety  Transfers Overall transfer level: Needs assistance Equipment used: Rolling walker (2 wheeled) Transfers: Sit to/from Stand Sit to Stand: Min guard         General transfer comment: min guard for safety. cues for hand placement.  Ambulation/Gait Ambulation/Gait assistance: Min guard Ambulation Distance (Feet): 150 Feet Assistive device: Rolling walker (2 wheeled) Gait Pattern/deviations: Step-through pattern;Decreased stride length   Gait velocity interpretation: Below normal speed for age/gender General Gait Details: cues  for increased dorsiflexion, posture  Stairs            Wheelchair Mobility    Modified Rankin (Stroke Patients Only)       Balance Overall balance assessment: Needs assistance Sitting-balance support: No upper extremity supported Sitting balance-Leahy Scale: Good     Standing balance support: Bilateral upper extremity supported Standing balance-Leahy Scale: Good                               Pertinent Vitals/Pain Pain Assessment: 0-10 Pain Score: 5  Pain Location: left knee Pain Descriptors / Indicators: Aching Pain Intervention(s): Limited activity within patient's tolerance;Monitored during session;Repositioned    Home Living Family/patient expects to be discharged to:: Private residence Living Arrangements: Spouse/significant other Available Help at Discharge: Family Type of Home: House Home Access: Stairs to enter Entrance Stairs-Rails: None Technical brewer of Steps: 2 Home Layout: One level Home Equipment: None      Prior Function Level of Independence: Independent               Hand Dominance        Extremity/Trunk Assessment   Upper Extremity Assessment: Overall WFL for tasks assessed           Lower Extremity Assessment: LLE deficits/detail   LLE Deficits / Details: decreased strength and ROM as expected postop  Cervical / Trunk Assessment: Normal  Communication      Cognition Arousal/Alertness: Awake/alert Behavior During Therapy: WFL for tasks assessed/performed Overall Cognitive Status: Within Functional Limits for tasks assessed  General Comments      Exercises Total Joint Exercises Quad Sets: AROM;Left;5 reps;Supine Heel Slides: AROM;Left;5 reps;Supine      Assessment/Plan    PT Assessment Patient needs continued PT services  PT Diagnosis Difficulty walking;Generalized weakness;Acute pain   PT Problem List Decreased strength;Decreased range of motion;Decreased  activity tolerance;Decreased mobility;Decreased knowledge of use of DME  PT Treatment Interventions DME instruction;Gait training;Stair training;Functional mobility training;Therapeutic activities;Therapeutic exercise;Patient/family education   PT Goals (Current goals can be found in the Care Plan section) Acute Rehab PT Goals Patient Stated Goal: return to work PT Goal Formulation: With patient Time For Goal Achievement: 03/13/16 Potential to Achieve Goals: Good    Frequency 7X/week   Barriers to discharge        Co-evaluation               End of Session Equipment Utilized During Treatment: Gait belt Activity Tolerance: Patient tolerated treatment well Patient left: in chair;with call bell/phone within reach           Time: 0804-0830 PT Time Calculation (min) (ACUTE ONLY): 26 min   Charges:   PT Evaluation $PT Eval Moderate Complexity: 1 Procedure PT Treatments $Gait Training: 8-22 mins   PT G CodesHaze Justin 04-Apr-2016, 8:43 AM  Haze Justin, SPT [phone]

## 2016-03-06 NOTE — Op Note (Signed)
NAMEINEZ, SAYLES NO.:  [id number]  MEDICAL RECORD NO.:  [id number]  LOCATION:  5N20C                        FACILITY:  [facility]  PHYSICIAN:  Alta Corning, M.D.   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  PROCEDURE:  Left total knee replacement with a Sigma system, size 5 femur, size 5 tibia, 15 mm bridging bearing, and a 41 mm all polyethylene patella.  SURGEON:  Alta Corning, M.D.  ASSISTANT:  Gary Fleet, PA  ANESTHESIA:  General.  BRIEF HISTORY:  Mr. Shallenberger is a 55 year old male with a long history of significant complaints of bilateral knee pain and had a high tibial osteotomy on the right.  He had a previous meniscectomy and debridement on the left.  He was having worsening pain in the left side and after failure of all conservative care, he was taken to the operating room for left total knee replacement.  DESCRIPTION OF PROCEDURE:  The patient was taken to the operating room. After adequate anesthesia was obtained with general anesthetic, the patient was placed supine on the operating table.  Left leg was then prepped and draped in usual sterile fashion.  Following this, the leg was exsanguinated.  Blood pressure tourniquet was inflated to 300 mmHg. Following this, a midline incision was made in the subcutaneous tissue down the level of extensor mechanism and a medial parapatellar arthrotomy was undertaken.  Once this was done, attention was turned to the knee where medial and lateral meniscus were removed, retropatellar fat pad, synovium on the anterior aspect of the femur, and medial and lateral meniscus were taken.  At this point, attention was  turned towards the femur where an intramedullary pilot hole was drilled and a 4- degree valgus alignment was used and 11 mm of distal bone was set and 12 mm was taken and measured in the jig.  Once this was done, attention was turned towards sizing at the femur sized to a 5.  Anterior and posterior cuts were made, chamfers and box.  Attention was then turned to the tibia, which was sized to a 5, it was drilled and keeled after being cut perpendicular to its long axis.  Once this was done, attention was turned towards placement of trial components.  A size 5 tibia was placed, size 5 femur was placed.  A 15 mm bridging bearing trial was appropriate and was put through a range of motion.  Excellent stability of extension and flexion were achieved.  Attention was turned to the patella, cut down to a level of 16 mm from a 28 a full-thickness size and a 41 paddle was chosen, and lugs were drilled for the paddle.  Once this was done, attention was turned towards removing all the trial components.  The knee was copiously and thoroughly lavaged, pulsatile lavage, irrigation, and suctioned dry.  We then cemented the tibia with a size 5, the femur size 5 was cemented  in place, a 15 mm bridging bearing trial was placed and a 41 all poly patella was placed and held with a clamp.  All excess bone cement was removed.  Once the cement was allowed to completely harden, tourniquet was let down.  Prior to tourniquet being let down and after cement was hardened, tranexamic acid 2 g in 50 mL of saline were placed into the joint and after about 3 minutes, the tourniquet was let down and for 2 more minutes tranexamic acid was in the knee.  This was then suction dried.  Exparel was used throughout the synovial reflection medial side and the posterior portion of the knee and the final 15 poly was opened and placed.  Excellent range of motion and stability were achieved and the knee was then again irrigated,  suctioned dry.  Medial parapatellar arthrotomy was closed with a 1-Vicryl running after a medium Hemovac drain was placed.  Skin was closed with 0 and 2-0 Vicryl, and 3-0 Monocryl subcuticular. Benzoin and Steri-Strips were applied.  Sterile compressive dressing was applied.  The patient was taken to the recovery room and was noted to be in a satisfactory condition.  Estimated blood loss for the procedure is minimal.     Alta Corning, M.D.     Corliss Skains  D:  03/05/2016  T:  03/06/2016  Job:  [id number]

## 2016-03-06 NOTE — Care Management Note (Signed)
Case Management Note  Patient Details  Name: Jeremy [name] MRN: [id number] Date of Birth: [birth year]  Subjective/Objective:       S/p left total knee arthroplasty             Action/Plan: Set up with [facility] for HHPT by MD office. Spoke with patient and wife, no change in discharge plan. Spoke with Manuela Schwartz at [place] and confirmed that they are set up for HHPT and [facility] for PT/INR draw on 03/08/16, lags to be called to Dr. Berenice Primas. [employer] delivered rolling walker and 3N1 to patient's room and will deliver CPM to home. Patient's wife will be assisting patient after discharge.   Expected Discharge Date:                  Expected Discharge Plan:  [place]  In-House Referral:  NA  Discharge planning Services  CM Consult  Post Acute Care Choice:  Durable Medical Equipment, Home Health Choice offered to:  Patient  DME Arranged:  3-N-1, CPM, Walker rolling DME Agency:  [employer]  HH Arranged:  PT, RN [place] Agency:  [facility]  Status of Service:  Completed, signed off  Medicare Important Message Given:    Date Medicare IM Given:    Medicare IM give by:    Date Additional Medicare IM Given:    Additional Medicare Important Message give by:     If discussed at [place] of Stay Meetings, dates discussed:    Additional Comments:  Nila Nephew, RN 03/06/2016, 2:32 PM

## 2016-03-06 NOTE — Progress Notes (Signed)
Orthopedic Tech Progress Note Patient Details:  Jeremy [name] 08-Mar-1961 [id number]  Patient ID: [name], male   DOB: 1961/05/04, 55 y.o.   MRN: [id number] Applied cpm 0-60  Karolee Stamps 03/06/2016, 5:58 AM

## 2016-03-27 ENCOUNTER — Ambulatory Visit: Payer: PRIVATE HEALTH INSURANCE | Attending: Orthopedic Surgery | Admitting: Physical Therapy

## 2016-03-27 DIAGNOSIS — R600 Localized edema: Secondary | ICD-10-CM | POA: Diagnosis present

## 2016-03-27 DIAGNOSIS — M25662 Stiffness of left knee, not elsewhere classified: Secondary | ICD-10-CM | POA: Insufficient documentation

## 2016-03-27 DIAGNOSIS — M25562 Pain in left knee: Secondary | ICD-10-CM | POA: Diagnosis present

## 2016-03-27 NOTE — Therapy (Signed)
[facility] [place], [place], [address] Phone: [phone]   Fax:  [phone]  Physical Therapy Evaluation  Patient Details  Name: Jeremy [name] MRN: [id number] Date of Birth: 01-02-1961 Referring Provider: Dorna Leitz MD  Encounter Date: 03/27/2016      PT End of Session - 03/27/16 1017    Visit Number 1   Number of Visits 24   Date for PT Re-Evaluation 05/22/16   PT Start Time 0900   PT Stop Time 1002   PT Time Calculation (min) 62 min   Activity Tolerance Patient tolerated treatment well   Behavior During Therapy [facility] for tasks assessed/performed      Past Medical History  Diagnosis Date  . PE (pulmonary embolism) 1992    post op- 1 week  . Peripheral vascular disease ([place])     post op DVT-  approx. 2009, treated at [facility]   . Hypertension   . Seasonal allergies   . H/O renal calculi     has been passed spontaneously but also had cystoscopy for one   . GERD (gastroesophageal reflux disease)   . Headache     migraines on occasion - used imitrex 5- 6 yrs. ago   . Arthritis     both knees     Past Surgical History  Procedure Laterality Date  . Knee surgery      8 arthroscopies & one repair of tendon- R   . Ankle surgery Right     repair of fracture & repair of torn tendons.  . Vocal cord injection      bilateral medialization, implants on both vocal chords   . Tonsillectomy    . Total knee arthroplasty Left 03/05/2016    Procedure: LEFT TOTAL KNEE ARTHROPLASTY;  Surgeon: Dorna Leitz, MD;  Location: [facility];  Service: Orthopedics;  Laterality: Left;    There were no vitals filed for this visit.       Subjective Assessment - 03/27/16 0909    Subjective I am highly motivated to get better.   Limitations Walking   How long can you walk comfortably? Short distances.   Patient Stated Goals Get back to an active lifestyle.   Currently in Pain? Yes   Pain Score 4    Pain Location Knee   Pain  Orientation Left   Aggravating Factors  Increased up time and overdoing it.   Pain Relieving Factors Ice, elevation, exercise and some walking.            [facility] PT Assessment - 03/27/16 0001    Assessment   Medical Diagnosis Left total knee replacement   Referring Provider Dorna Leitz MD   Onset Date/Surgical Date --  03/05/16   Precautions   Precautions --  No ultrasound.   Restrictions   Weight Bearing Restrictions No   Balance Screen   Has the patient fallen in the past 6 months No   Has the patient had a decrease in activity level because of a fear of falling?  No   Is the patient reluctant to leave their home because of a fear of falling?  No   Home Ecologist residence   Prior Function   Level of Independence Independent   Cognition   Overall Cognitive Status Within Functional Limits for tasks assessed   Observation/Other Assessments-Edema    Edema Circumferential   Circumferential Edema   Circumferential - Right RT 5 CMS> LT.   ROM / Strength  AROM / PROM / Strength AROM;Strength   AROM   Overall AROM Comments Active left knee extension= -20 degrees and passive= -17 degrees with active flexion to 70 degrees and 73 degrees passively.   Strength   Overall Strength Comments left hip strength and knee strength= 4/5 and a notable decrease in volitional contraction of the patient's left quadriceps musculature.   Palpation   Palpation comment Very tender to palpation around the patient's left patella.   Special Tests    Special Tests --  Min+/mod- decrease in left patellar mobility.   Transfers   Transfers Sit to Stand   Sit to Stand 6: Modified independent (Device/Increase time)   Ambulation/Gait   Ambulation/Gait Yes   Ambulation/Gait Assistance 7: Independent   Ambulation Distance (Feet) --  50 feet.   Assistive device --  None.   Gait Pattern Step-to pattern;Decreased step length - left;Decreased stance time - left;Right flexed  knee in stance;Antalgic                   [facility] Adult PT Treatment/Exercise - 03/27/16 0001    Exercises   Exercises Knee/Hip   Knee/Hip Exercises: Aerobic   Nustep Level 3 x 10 minutes.   Modalities   Modalities Designer, multimedia Location Left knee   Electrical Stimulation Action 1-10 Hz at 100% scan x 20 minutes.   Electrical Stimulation Goals Pain   Vasopneumatic   Number Minutes Vasopneumatic  20 minutes   Vasopnuematic Location  --  Left knee.   Vasopneumatic Pressure Medium                  PT Short Term Goals - 03/27/16 1024    PT SHORT TERM GOAL #1   Title Ind with an initial HEP.   Time 4   Period Weeks   Status New   PT SHORT TERM GOAL #2   Title Achieve full left knee extension to normalize gait.   Time 4   Period Weeks   Status New   PT SHORT TERM GOAL #3   Title Active left knee flexion to 90 degrees.   Time 4   Period Weeks   Status New           PT Long Term Goals - 03/27/16 1027    PT LONG TERM GOAL #1   Title Ind with an advanced HEP.   Time 8   Period Weeks   Status New   PT LONG TERM GOAL #2   Title Active left knee flexion to 115 degrees+ so the patient can perform functional tasks and do so with pain not > 2-3/10.   Time 8   Period Weeks   Status New   PT LONG TERM GOAL #3   Title Decrease edema to within 2.5 cms of non-affected side to assist with pain reduction and range of motion gains.   Time 8   Period Weeks   Status New   PT LONG TERM GOAL #4   Title Increase left knee strength to a solid 5/5 to provide good stability for accomplishment of functional activities   Time 8   Period Weeks   Status New   PT LONG TERM GOAL #5   Title Perform a reciprocating stair gait with one railing with pain not > 2-3/10.   Time 8   Period Weeks   Status New  Plan - 03/27/16 1019    Clinical Impression Statement The patient  underwent a left total knee replacement on 03/05/16.  He has had multiple previous knee surgeries (4 on the left and 5 on the right).  He also had a set back after surgery regarding consipation and had to go to an ED.  He also overdid it at home and was unable to move his left knee for awhile.  His knee is stiff and he has been using his CPM and is compliant to a HEP.  His resting pain-level is a 4/10 and up 7-8/10 with ROM activity.   Rehab Potential Good   Clinical Impairments Affecting Rehab Potential Multiple previous knee surgeries and 2 significant setbacks since surggery.   PT Frequency 3x / week   PT Duration 8 weeks   PT Treatment/Interventions ADLs/Self Care Home Management;Cryotherapy;Electrical Stimulation;Moist Heat;Therapeutic exercise;Therapeutic activities;Stair training;Gait training;Neuromuscular re-education;Patient/family education;Manual techniques;Passive range of motion;Vasopneumatic Device   PT Next Visit Plan Nustep; PROM; rockerboard; VMS to left quadriceps; electrical stim and vasopneumatic.   Consulted and Agree with Plan of Care Patient      Patient will benefit from skilled therapeutic intervention in order to improve the following deficits and impairments:  Abnormal gait, Decreased activity tolerance, Pain, Increased edema, Decreased strength, Decreased range of motion  Visit Diagnosis: Pain in left knee  Stiffness of knee joint, left  Localized edema     Problem List Patient Active Problem List   Diagnosis Date Noted  . History of pulmonary embolism 03/06/2016  . Primary osteoarthritis of left knee 03/05/2016    Geovana Gebel, [place] MPT 03/27/2016, 10:49 AM  [facility] [address] [place], [place], [address] Phone: [phone]   Fax:  [phone]  Name: Jeremy [name] MRN: [id number] Date of Birth: Feb 23, 1961

## 2016-03-28 ENCOUNTER — Encounter: Payer: Self-pay | Admitting: Physical Therapy

## 2016-03-28 ENCOUNTER — Ambulatory Visit: Payer: PRIVATE HEALTH INSURANCE | Admitting: Physical Therapy

## 2016-03-28 DIAGNOSIS — M25662 Stiffness of left knee, not elsewhere classified: Secondary | ICD-10-CM

## 2016-03-28 DIAGNOSIS — R600 Localized edema: Secondary | ICD-10-CM

## 2016-03-28 DIAGNOSIS — M25562 Pain in left knee: Secondary | ICD-10-CM

## 2016-03-28 NOTE — Therapy (Signed)
[facility] [place], [place], [address] Phone: [phone]   Fax:  [phone]  Physical Therapy Treatment  Patient Details  Name: Jeremy [name] MRN: [id number] Date of Birth: 07-Jul-1961 Referring Provider: Dorna Leitz MD  Encounter Date: 03/28/2016      PT End of Session - 03/28/16 0815    Visit Number 2   Number of Visits 24   Date for PT Re-Evaluation 05/22/16   PT Start Time 0817   PT Stop Time 0908   PT Time Calculation (min) 51 min   Activity Tolerance Patient tolerated treatment well   Behavior During Therapy [facility] for tasks assessed/performed      Past Medical History  Diagnosis Date  . PE (pulmonary embolism) 1992    post op- 1 week  . Peripheral vascular disease ([place])     post op DVT-  approx. 2009, treated at [facility]   . Hypertension   . Seasonal allergies   . H/O renal calculi     has been passed spontaneously but also had cystoscopy for one   . GERD (gastroesophageal reflux disease)   . Headache     migraines on occasion - used imitrex 5- 6 yrs. ago   . Arthritis     both knees     Past Surgical History  Procedure Laterality Date  . Knee surgery      8 arthroscopies & one repair of tendon- R   . Ankle surgery Right     repair of fracture & repair of torn tendons.  . Vocal cord injection      bilateral medialization, implants on both vocal chords   . Tonsillectomy    . Total knee arthroplasty Left 03/05/2016    Procedure: LEFT TOTAL KNEE ARTHROPLASTY;  Surgeon: Dorna Leitz, MD;  Location: [facility];  Service: Orthopedics;  Laterality: Left;    There were no vitals filed for this visit.      Subjective Assessment - 03/28/16 0815    Subjective States that he overdid it Monday with exercise and staying up his feet a lot. States that CPM bruised him a little bit.   Limitations Walking   How long can you walk comfortably? Short distances.   Patient Stated Goals Get back to an active lifestyle.    Currently in Pain? Yes   Pain Score 4    Pain Location Knee   Pain Orientation Left   Pain Descriptors / Indicators Sore;Tightness   Pain Type Surgical pain            [facility] PT Assessment - 03/28/16 0001    Assessment   Medical Diagnosis Left total knee replacement   Onset Date/Surgical Date 03/05/16   Next MD Visit 04/05/2016   Restrictions   Weight Bearing Restrictions No                     [facility] Adult PT Treatment/Exercise - 03/28/16 0001    Knee/Hip Exercises: Stretches   Active Hamstring Stretch Left;3 reps;30 seconds   Knee/Hip Exercises: Aerobic   Nustep L3, seat 13 x10 min   Knee/Hip Exercises: Standing   Forward Lunges Left;2 sets;10 reps;3 seconds  14" step   Rocker Board 3 minutes   Knee/Hip Exercises: Seated   Long Arc Quad Strengthening;Left;2 sets;10 reps;Weights   Long Arc Quad Weight 3 lbs.   Modalities   Modalities Designer, multimedia Location L knee    Electrical Stimulation  Action IFC   Electrical Stimulation Parameters 1-10 Hz x15 mn   Electrical Stimulation Goals Edema;Pain   Vasopneumatic   Number Minutes Vasopneumatic  15 minutes   Vasopnuematic Location  Knee   Vasopneumatic Pressure Medium   Vasopneumatic Temperature  34   Manual Therapy   Manual Therapy Passive ROM;Soft tissue mobilization   Soft tissue mobilization L knee incision mobilizations to decrease adhesions that may be present; L patella mobilizaitons in sup/inf which were greatly limited today secondary to edema   Passive ROM PROM of L knee into ext/flex with gentle holds at end range                 PT Education - 03/28/16 0839    Education provided Yes   Education Details HEP- HS stretch, forward lunges; verbal education regarding prone hangs   Person(s) Educated Patient   Methods Explanation;Demonstration;Verbal cues;Handout   Comprehension Verbalized understanding;Returned  demonstration;Verbal cues required          PT Short Term Goals - 03/27/16 1024    PT SHORT TERM GOAL #1   Title Ind with an initial HEP.   Time 4   Period Weeks   Status New   PT SHORT TERM GOAL #2   Title Achieve full left knee extension to normalize gait.   Time 4   Period Weeks   Status New   PT SHORT TERM GOAL #3   Title Active left knee flexion to 90 degrees.   Time 4   Period Weeks   Status New           PT Long Term Goals - 03/27/16 1027    PT LONG TERM GOAL #1   Title Ind with an advanced HEP.   Time 8   Period Weeks   Status New   PT LONG TERM GOAL #2   Title Active left knee flexion to 115 degrees+ so the patient can perform functional tasks and do so with pain not > 2-3/10.   Time 8   Period Weeks   Status New   PT LONG TERM GOAL #3   Title Decrease edema to within 2.5 cms of non-affected side to assist with pain reduction and range of motion gains.   Time 8   Period Weeks   Status New   PT LONG TERM GOAL #4   Title Increase left knee strength to a solid 5/5 to provide good stability for accomplishment of functional activities   Time 8   Period Weeks   Status New   PT LONG TERM GOAL #5   Title Perform a reciprocating stair gait with one railing with pain not > 2-3/10.   Time 8   Period Weeks   Status New               Plan - 03/28/16 0855    Clinical Impression Statement Patient tolerated today's treatment well with no reports of increased pain with any of the exercises completed today. Patient ambulates with decreased L knee flexion and with an antalgic gait pattern at this time with no AD in clinic. Educated regarding purpose of exercises with each exercise and verbalized understanding of education. Patient's L knee ROM remains limited at this time and demonstrates increased edema. Patient's L knee incision demonstrated fairly good mobility although patient reported tenderness just inferior to patella during palpation. L patellar mobility  limited greatly today and edema was noted around L patella. Accepted new HEP for stretches and lunges and verbalized understanding  regarding education. Patient was educated regarding prone hang and how to progress that exercise if he could tolerate it as patient reported he does prone hang for R knee currnetly. Normal modalities response noted following removal of the modalities. Patient presented with purple colored bruise in the mid medial L thigh and reported continued soreness in region of tourniquet placement.   Rehab Potential Good   Clinical Impairments Affecting Rehab Potential Multiple previous knee surgeries and 2 significant setbacks since surggery.   PT Frequency 3x / week   PT Duration 8 weeks   PT Treatment/Interventions ADLs/Self Care Home Management;Cryotherapy;Electrical Stimulation;Moist Heat;Therapeutic exercise;Therapeutic activities;Stair training;Gait training;Neuromuscular re-education;Patient/family education;Manual techniques;Passive range of motion;Vasopneumatic Device   PT Next Visit Plan Continue with TKR protocol per MPT POC with modalities for pain PRN.   PT Home Exercise Plan HEP- HS stretch, forward lunges and verbal education regarding prone hangs   Consulted and Agree with Plan of Care Patient      Patient will benefit from skilled therapeutic intervention in order to improve the following deficits and impairments:  Abnormal gait, Decreased activity tolerance, Pain, Increased edema, Decreased strength, Decreased range of motion  Visit Diagnosis: Pain in left knee  Stiffness of knee joint, left  Localized edema     Problem List Patient Active Problem List   Diagnosis Date Noted  . History of pulmonary embolism 03/06/2016  . Primary osteoarthritis of left knee 03/05/2016    Wynelle Fanny, PTA 03/28/2016, 10:08 AM  [facility] [place], [place], [address] Phone: [phone]   Fax:   [phone]  Name: GEOGGREY GRESH MRN: [id number] Date of Birth: 03/06/1961

## 2016-03-28 NOTE — Patient Instructions (Signed)
Stretching: Hamstring (Standing)    Place left foot on stool. Slowly lean forward, keeping back straight, until stretch is felt in back of thigh. Hold __30__ seconds. Repeat _3___ times per set. Do _1___ sets per session. Do __2-3__ sessions per day.  http://orth.exer.us/658   Copyright  VHI. All rights reserved.  Forward Lunge    Standing with feet shoulder width apart and stomach tight, step forward with left leg up on a step. Repeat __10__ times per set. Do _2___ sets per session. Do _2-3___ sessions per day.  http://orth.exer.us/1146   Copyright  VHI. All rights reserved.

## 2016-03-30 ENCOUNTER — Ambulatory Visit: Payer: PRIVATE HEALTH INSURANCE | Admitting: *Deleted

## 2016-03-30 DIAGNOSIS — M25662 Stiffness of left knee, not elsewhere classified: Secondary | ICD-10-CM

## 2016-03-30 DIAGNOSIS — R600 Localized edema: Secondary | ICD-10-CM

## 2016-03-30 DIAGNOSIS — M25562 Pain in left knee: Secondary | ICD-10-CM

## 2016-03-30 NOTE — Therapy (Signed)
[facility] [place], [place], [address] Phone: [phone]   Fax:  [phone]  Physical Therapy Treatment  Patient Details  Name: Jeremy [name] MRN: [id number] Date of Birth: [birth year] Referring Provider: Dorna Leitz MD  Encounter Date: 03/30/2016      PT End of Session - 03/30/16 0828    Visit Number 3   Number of Visits 24   Date for PT Re-Evaluation 05/22/16   PT Start Time 0815   PT Stop Time 0915   PT Time Calculation (min) 60 min      Past Medical History  Diagnosis Date  . PE (pulmonary embolism) 1992    post op- 1 week  . Peripheral vascular disease ([place])     post op DVT-  approx. 2009, treated at [facility]   . Hypertension   . Seasonal allergies   . H/O renal calculi     has been passed spontaneously but also had cystoscopy for one   . GERD (gastroesophageal reflux disease)   . Headache     migraines on occasion - used imitrex 5- 6 yrs. ago   . Arthritis     both knees     Past Surgical History  Procedure Laterality Date  . Knee surgery      8 arthroscopies & one repair of tendon- R   . Ankle surgery Right     repair of fracture & repair of torn tendons.  . Vocal cord injection      bilateral medialization, implants on both vocal chords   . Tonsillectomy    . 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      Subjective Assessment - 03/30/16 0823    Subjective States that he overdid it Monday with exercise and staying up his feet a lot. States that CPM bruised him a little bit.   Limitations Walking   How long can you walk comfortably? Short distances.   Patient Stated Goals Get back to an active lifestyle.   Currently in Pain? Yes   Pain Score 4    Pain Location Knee   Pain Orientation Left   Pain Descriptors /  Indicators Sore;Tightness   Pain Type Surgical pain                         [facility] Adult PT Treatment/Exercise - 03/30/16 0001    Exercises   Exercises Knee/Hip   Knee/Hip Exercises: Aerobic   Nustep L3, seat 10  x15 min   Knee/Hip Exercises: Standing   Forward Lunges Left;2 sets;10 reps;3 seconds  14" step   Rocker Board 3 minutes   Knee/Hip Exercises: Seated   Long Arc Quad --   Long Arc [employer] --   Modalities   Modalities Designer, multimedia Location L knee    Printmaker Action VMS x15 mins  10 secs on/off with Quad sets with elevation   Electrical Stimulation Goals Edema;Pain   Vasopneumatic   Number Minutes Vasopneumatic  15 minutes   Vasopnuematic Location  Knee   Vasopneumatic Pressure Medium   Vasopneumatic Temperature  34   Manual Therapy   Manual Therapy Passive ROM;Soft tissue mobilization   Soft tissue mobilization L knee incision mobilizations to decrease adhesions  that may be present; L patella mobilizaitons in sup/inf which were greatly limited today secondary to edema   Passive ROM PROM of L knee into ext/flex with gentle holds at end range                   PT Short Term Goals - 03/27/16 1024    PT SHORT TERM GOAL #1   Title Ind with an initial HEP.   Time 4   Period Weeks   Status New   PT SHORT TERM GOAL #2   Title Achieve full left knee extension to normalize gait.   Time 4   Period Weeks   Status New   PT SHORT TERM GOAL #3   Title Active left knee flexion to 90 degrees.   Time 4   Period Weeks   Status New           PT Long Term Goals - 03/27/16 1027    PT LONG TERM GOAL #1   Title Ind with an advanced HEP.   Time 8   Period Weeks   Status New   PT LONG TERM GOAL #2   Title Active left knee flexion to 115 degrees+ so the patient can perform functional tasks and do so with pain not > 2-3/10.   Time 8   Period Weeks    Status New   PT LONG TERM GOAL #3   Title Decrease edema to within 2.5 cms of non-affected side to assist with pain reduction and range of motion gains.   Time 8   Period Weeks   Status New   PT LONG TERM GOAL #4   Title Increase left knee strength to a solid 5/5 to provide good stability for accomplishment of functional activities   Time 8   Period Weeks   Status New   PT LONG TERM GOAL #5   Title Perform a reciprocating stair gait with one railing with pain not > 2-3/10.   Time 8   Period Weeks   Status New               Plan - 03/30/16 0830    Clinical Impairments Affecting Rehab Potential Multiple previous knee surgeries and 2 significant setbacks since surggery.   PT Frequency 3x / week   PT Duration 8 weeks   PT Treatment/Interventions ADLs/Self Care Home Management;Cryotherapy;Electrical Stimulation;Moist Heat;Therapeutic exercise;Therapeutic activities;Stair training;Gait training;Neuromuscular re-education;Patient/family education;Manual techniques;Passive range of motion;Vasopneumatic Device   PT Next Visit Plan Continue with TKR protocol per MPT POC with modalities for pain PRN.   PT Home Exercise Plan HEP- HS stretch, forward lunges and verbal education regarding prone hangs   Consulted and Agree with Plan of Care Patient      Patient will benefit from skilled therapeutic intervention in order to improve the following deficits and impairments:  Abnormal gait, Decreased activity tolerance, Pain, Increased edema, Decreased strength, Decreased range of motion  Visit Diagnosis: Pain in left knee  Stiffness of knee joint, left  Localized edema     Problem List Patient Active Problem List   Diagnosis Date Noted  . History of pulmonary embolism 03/06/2016  . Primary osteoarthritis of left knee 03/05/2016    [name],[name], PTA 03/30/2016, 9:28 AM  [facility] [place], [place], [address] Phone:  [phone]   Fax:  [phone]  Name: Jeremy [name] MRN: [id number] Date of Birth: [birth year]

## 2016-04-03 ENCOUNTER — Ambulatory Visit: Payer: PRIVATE HEALTH INSURANCE | Attending: Orthopedic Surgery | Admitting: Physical Therapy

## 2016-04-03 ENCOUNTER — Encounter: Payer: Self-pay | Admitting: Physical Therapy

## 2016-04-03 DIAGNOSIS — R600 Localized edema: Secondary | ICD-10-CM | POA: Insufficient documentation

## 2016-04-03 DIAGNOSIS — M25662 Stiffness of left knee, not elsewhere classified: Secondary | ICD-10-CM | POA: Insufficient documentation

## 2016-04-03 DIAGNOSIS — M25562 Pain in left knee: Secondary | ICD-10-CM | POA: Diagnosis present

## 2016-04-03 NOTE — Therapy (Signed)
[facility] [place], [place], [address] Phone: [phone]   Fax:  [phone]  Physical Therapy Treatment  Patient Details  Name: Jeremy [name] MRN: [id number] Date of Birth: 1961/11/03 Referring Provider: Dorna Leitz MD  Encounter Date: 04/03/2016      PT End of Session - 04/03/16 0818    Visit Number 4   Number of Visits 24   Date for PT Re-Evaluation 05/22/16   PT Start Time 0816   PT Stop Time 0907   PT Time Calculation (min) 51 min   Activity Tolerance Patient tolerated treatment well   Behavior During Therapy [facility] for tasks assessed/performed      Past Medical History  Diagnosis Date  . PE (pulmonary embolism) 1992    post op- 1 week  . Peripheral vascular disease ([place])     post op DVT-  approx. 2009, treated at [facility]   . Hypertension   . Seasonal allergies   . H/O renal calculi     has been passed spontaneously but also had cystoscopy for one   . GERD (gastroesophageal reflux disease)   . Headache     migraines on occasion - used imitrex 5- 6 yrs. ago   . Arthritis     both knees     Past Surgical History  Procedure Laterality Date  . Knee surgery      8 arthroscopies & one repair of tendon- R   . Ankle surgery Right     repair of fracture & repair of torn tendons.  . Vocal cord injection      bilateral medialization, implants on both vocal chords   . Tonsillectomy    . Total knee arthroplasty Left 03/05/2016    Procedure: LEFT TOTAL KNEE ARTHROPLASTY;  Surgeon: Dorna Leitz, MD;  Location: [place];  Service: Orthopedics;  Laterality: Left;    There were no vitals filed for this visit.      Subjective Assessment - 04/03/16 0817    Subjective Reports increased soreness in L knee today. Asked regarding when swelling would go away. Continues to ice at home. Reports that he push mowed most of his yard over the weekend.   Limitations Walking   How long can you walk comfortably? Short distances.    Patient Stated Goals Get back to an active lifestyle.   Currently in Pain? Yes   Pain Score 6    Pain Location Knee   Pain Orientation Left   Pain Descriptors / Indicators Sore   Pain Type Surgical pain            [facility] PT Assessment - 04/03/16 0001    Assessment   Medical Diagnosis Left total knee replacement   Onset Date/Surgical Date 03/05/16   Next MD Visit 04/05/2016   Restrictions   Weight Bearing Restrictions No   Observation/Other Assessments-Edema    Edema --   ROM / Strength   AROM / PROM / Strength AROM;PROM   AROM   Overall AROM  Deficits   AROM Assessment Site Knee   Right/Left Knee Left   Left Knee Flexion 91   PROM   Overall PROM  --   PROM Assessment Site --   Right/Left Knee --                     [facility] Adult PT Treatment/Exercise - 04/03/16 0001    Knee/Hip Exercises: Stretches   Active Hamstring Stretch Left;3 reps;30 seconds   Knee/Hip Exercises:  Aerobic   Nustep L5, seat 14 x10 min   Knee/Hip Exercises: Standing   Forward Lunges Left;2 sets;10 reps;3 seconds  14" step   Rocker Board 3 minutes   Knee/Hip Exercises: Seated   Long Arc Quad Strengthening;Left;2 sets;10 reps;Weights   Long Arc Quad Weight 3 lbs.   Modalities   Modalities Designer, multimedia Location L knee    Electrical Stimulation Action IFC   Electrical Stimulation Parameters 1-10 hz x15 min   Electrical Stimulation Goals Edema;Pain   Vasopneumatic   Number Minutes Vasopneumatic  15 minutes   Vasopnuematic Location  Knee   Vasopneumatic Pressure Medium   Vasopneumatic Temperature  34   Manual Therapy   Manual Therapy Passive ROM;Soft tissue mobilization   Soft tissue mobilization L patellar mobilizations in sup/inf, L/R to decrease adhesions that may be present; STW with pressure to L HS to decrease tightness   Passive ROM PROM of L knee into ext/flex with gentle holds at end range                    PT Short Term Goals - 04/03/16 0900    PT SHORT TERM GOAL #1   Title Ind with an initial HEP.   Time 4   Period Weeks   Status Achieved   PT SHORT TERM GOAL #2   Title Achieve full left knee extension to normalize gait.   Time 4   Period Weeks   Status On-going   PT SHORT TERM GOAL #3   Title Active left knee flexion to 90 degrees.   Time 4   Period Weeks   Status Achieved  AROM L knee flexion 92 deg 04/03/2016           PT Long Term Goals - 04/03/16 0900    PT LONG TERM GOAL #1   Title Ind with an advanced HEP.   Time 8   Period Weeks   Status On-going   PT LONG TERM GOAL #2   Title Active left knee flexion to 115 degrees+ so the patient can perform functional tasks and do so with pain not > 2-3/10.   Time 8   Period Weeks   Status On-going   PT LONG TERM GOAL #3   Title Decrease edema to within 2.5 cms of non-affected side to assist with pain reduction and range of motion gains.   Time 8   Period Weeks   Status On-going   PT LONG TERM GOAL #4   Title Increase left knee strength to a solid 5/5 to provide good stability for accomplishment of functional activities   Time 8   Period Weeks   Status On-going   PT LONG TERM GOAL #5   Title Perform a reciprocating stair gait with one railing with pain not > 2-3/10.   Time 8   Period Weeks   Status On-going               Plan - 04/03/16 0855    Clinical Impression Statement Patient tolerated today's treatment fairly well as he arrived with increased soreness as he reported he push mowed most of his yard over the weekend. Patient continues to present with increased edema of the L knee around L patella and pockets of inflammation on B sides of L patellar tendon. Patellar mobility was improved today although still limited secondary to edema. L HS tightness noted especially in the medial HS that responded well to STW  with pressure. Reports compliance with HEP at home. AROM of L knee flexion  measured as 92 deg today in supine. Normal modaliteis response noted following removal of the modalities. Bruising noted in medial L thigh is now a lighter shade of purple.   Rehab Potential Good   Clinical Impairments Affecting Rehab Potential Multiple previous knee surgeries and 2 significant setbacks since surggery.   PT Frequency 3x / week   PT Duration 8 weeks   PT Treatment/Interventions ADLs/Self Care Home Management;Cryotherapy;Electrical Stimulation;Moist Heat;Therapeutic exercise;Therapeutic activities;Stair training;Gait training;Neuromuscular re-education;Patient/family education;Manual techniques;Passive range of motion;Vasopneumatic Device   PT Next Visit Plan Continue with TKR protocol per MPT POC with modalities for pain PRN; MD noted needed for 04/05/2016 appt.   PT Home Exercise Plan HEP- HS stretch, forward lunges and verbal education regarding prone hangs   Consulted and Agree with Plan of Care Patient      Patient will benefit from skilled therapeutic intervention in order to improve the following deficits and impairments:  Abnormal gait, Decreased activity tolerance, Pain, Increased edema, Decreased strength, Decreased range of motion  Visit Diagnosis: Pain in left knee  Stiffness of knee joint, left  Localized edema     Problem List Patient Active Problem List   Diagnosis Date Noted  . History of pulmonary embolism 03/06/2016  . Primary osteoarthritis of left knee 03/05/2016    Wynelle Fanny, PTA 04/03/2016, 9:36 AM  [facility] [place], [place], [address] Phone: [phone]   Fax:  [phone]  Name: Jeremy [name] MRN: [id number] Date of Birth: 05/12/1961

## 2016-04-04 ENCOUNTER — Ambulatory Visit: Payer: PRIVATE HEALTH INSURANCE | Admitting: Physical Therapy

## 2016-04-04 ENCOUNTER — Encounter: Payer: Self-pay | Admitting: Physical Therapy

## 2016-04-04 DIAGNOSIS — M25562 Pain in left knee: Secondary | ICD-10-CM | POA: Diagnosis not present

## 2016-04-04 DIAGNOSIS — M25662 Stiffness of left knee, not elsewhere classified: Secondary | ICD-10-CM

## 2016-04-04 DIAGNOSIS — R600 Localized edema: Secondary | ICD-10-CM

## 2016-04-04 NOTE — Therapy (Signed)
[facility] [place], [place], [address] Phone: [phone]   Fax:  [phone]  Physical Therapy Treatment  Patient Details  Name: Jeremy [name] MRN: [id number] Date of Birth: Aug 10, 1961 Referring Provider: Dorna Leitz MD  Encounter Date: 04/04/2016      PT End of Session - 04/04/16 0812    Visit Number 5   Number of Visits 24   Date for PT Re-Evaluation 05/22/16   PT Start Time 0817   PT Stop Time 0904   PT Time Calculation (min) 47 min   Activity Tolerance Patient tolerated treatment well   Behavior During Therapy [facility] for tasks assessed/performed      Past Medical History  Diagnosis Date  . PE (pulmonary embolism) 1992    post op- 1 week  . Peripheral vascular disease ([place])     post op DVT-  approx. 2009, treated at [facility]   . Hypertension   . Seasonal allergies   . H/O renal calculi     has been passed spontaneously but also had cystoscopy for one   . GERD (gastroesophageal reflux disease)   . Headache     migraines on occasion - used imitrex 5- 6 yrs. ago   . Arthritis     both knees     Past Surgical History  Procedure Laterality Date  . Knee surgery      8 arthroscopies & one repair of tendon- R   . Ankle surgery Right     repair of fracture & repair of torn tendons.  . Vocal cord injection      bilateral medialization, implants on both vocal chords   . Tonsillectomy    . Total knee arthroplasty Left 03/05/2016    Procedure: LEFT TOTAL KNEE ARTHROPLASTY;  Surgeon: Dorna Leitz, MD;  Location: [place];  Service: Orthopedics;  Laterality: Left;    There were no vitals filed for this visit.      Subjective Assessment - 04/04/16 0812    Subjective Reports that he has tightness and inflammation present this morning.   Limitations Walking   How long can you walk comfortably? Short distances.   Patient Stated Goals Get back to an active lifestyle.   Currently in Pain? Yes   Pain Score 4    Pain  Location Knee   Pain Orientation Left   Pain Descriptors / Indicators Tightness   Pain Type Surgical pain            [facility] PT Assessment - 04/04/16 0001    Assessment   Medical Diagnosis Left total knee replacement   Onset Date/Surgical Date 03/05/16   Next MD Visit 04/05/2016   Restrictions   Weight Bearing Restrictions No   Observation/Other Assessments-Edema    Edema Circumferential   Circumferential Edema   Circumferential - Right 41.2   Circumferential - Left  42.6   ROM / Strength   AROM / PROM / Strength AROM;PROM   AROM   Overall AROM  Deficits   AROM Assessment Site Knee   Right/Left Knee Left   Left Knee Extension -20   Left Knee Flexion 94   PROM   Overall PROM  Deficits   PROM Assessment Site Knee   Right/Left Knee Left   Left Knee Extension -17   Left Knee Flexion 100                     [facility] Adult PT Treatment/Exercise - 04/04/16 0001    Knee/Hip  Exercises: Aerobic   Nustep L5, seat 12 x10 min   Knee/Hip Exercises: Standing   Forward Lunges Left;2 sets;10 reps;3 seconds  14" step   Rocker Board 3 minutes   Knee/Hip Exercises: Seated   Long Arc Quad Strengthening;Left;2 sets;10 reps;Weights   Long Arc Quad Weight 3 lbs.   Modalities   Modalities Designer, multimedia Location L knee    Electrical Stimulation Action IFC   Electrical Stimulation Parameters 1-10 Hz x15 min   Electrical Stimulation Goals Edema;Pain   Vasopneumatic   Number Minutes Vasopneumatic  15 minutes   Vasopnuematic Location  Knee   Vasopneumatic Pressure Medium   Vasopneumatic Temperature  34   Manual Therapy   Manual Therapy Soft tissue mobilization   Soft tissue mobilization L patellar mobilizations in sup/inf, L/R to decrease adhesions that may be present; L incisoin mobiliations in different directions to promote proper mobility                  PT Short Term Goals - 04/04/16  0850    PT SHORT TERM GOAL #1   Title Ind with an initial HEP.   Time 4   Period Weeks   Status Achieved   PT SHORT TERM GOAL #2   Title Achieve full left knee extension to normalize gait.   Time 4   Period Weeks   Status On-going  AROM L knee extension -20 deg from neutral 04/04/2016   PT SHORT TERM GOAL #3   Title Active left knee flexion to 90 degrees.   Time 4   Period Weeks   Status Achieved  AROM L knee flexion 94 deg 04/04/2016           PT Long Term Goals - 04/04/16 0841    PT LONG TERM GOAL #1   Title Ind with an advanced HEP.   Time 8   Period Weeks   Status On-going   PT LONG TERM GOAL #2   Title Active left knee flexion to 115 degrees+ so the patient can perform functional tasks and do so with pain not > 2-3/10.   Time 8   Period Weeks   Status On-going  AROM L knee flexion 94 deg 04/04/2016   PT LONG TERM GOAL #3   Title Decrease edema to within 2.5 cms of non-affected side to assist with pain reduction and range of motion gains.   Time 8   Period Weeks   Status Achieved  1.4 cm difference edema with L>R 04/04/2016   PT LONG TERM GOAL #4   Title Increase left knee strength to a solid 5/5 to provide good stability for accomplishment of functional activities   Time 8   Period Weeks   Status On-going   PT LONG TERM GOAL #5   Title Perform a reciprocating stair gait with one railing with pain not > 2-3/10.   Time 8   Period Weeks   Status On-going               Plan - 04/04/16 0850    Clinical Impression Statement Patient tolerated today's treatment well with no reports of increased L knee pain throughout the treatment. Patient continues to present with increased L knee edema with an overall different of 1.4 cm with L>R. AROM of L knee measured as 20-94 deg in supine and 17-100 degress passively in supine. L patellar mobility limited in sup/inf direction secondary to edema but good mobility with L/R.  Ambulates at this time without an AD with antalgic  gait and limited due to deficits with ROM. Able to achieve ST HEP and AROM flexion goal and LT edema goal. All other goals on-going secondary to L knee ROM deficits, strength deficits, and stair ambulation difficulty. Normal modalites response noted following removal of the modalites.    Rehab Potential Good   Clinical Impairments Affecting Rehab Potential Multiple previous knee surgeries and 2 significant setbacks since surggery.   PT Frequency 3x / week   PT Duration 8 weeks   PT Treatment/Interventions ADLs/Self Care Home Management;Cryotherapy;Electrical Stimulation;Moist Heat;Therapeutic exercise;Therapeutic activities;Stair training;Gait training;Neuromuscular re-education;Patient/family education;Manual techniques;Passive range of motion;Vasopneumatic Device   PT Next Visit Plan Continue with TKR protocol per MPT POC with modalities for pain PRN; initiate further strengthening next treatment.   PT Home Exercise Plan HEP- HS stretch, forward lunges and verbal education regarding prone hangs   Consulted and Agree with Plan of Care Patient      Patient will benefit from skilled therapeutic intervention in order to improve the following deficits and impairments:  Abnormal gait, Decreased activity tolerance, Pain, Increased edema, Decreased strength, Decreased range of motion  Visit Diagnosis: Pain in left knee  Stiffness of knee joint, left  Localized edema     Problem List Patient Active Problem List   Diagnosis Date Noted  . History of pulmonary embolism 03/06/2016  . Primary osteoarthritis of left knee 03/05/2016    APPLEGATE, [place], PTA 04/04/2016, 10:06 AM [place] Applegate MPT [facility] [address] [place], [place], [address] Phone: [phone]   Fax:  [phone]  Name: Jeremy [name] MRN: [id number] Date of Birth: [birth year]

## 2016-04-06 ENCOUNTER — Ambulatory Visit: Payer: PRIVATE HEALTH INSURANCE | Admitting: *Deleted

## 2016-04-06 DIAGNOSIS — M25662 Stiffness of left knee, not elsewhere classified: Secondary | ICD-10-CM

## 2016-04-06 DIAGNOSIS — M25562 Pain in left knee: Secondary | ICD-10-CM | POA: Diagnosis not present

## 2016-04-06 NOTE — Therapy (Signed)
[facility] [place], [place], [address] Phone: [phone]   Fax:  [phone]  Physical Therapy Treatment  Patient Details  Name: Jeremy [name] MRN: [id number] Date of Birth: 1961-03-16 Referring Provider: Dorna Leitz MD  Encounter Date: 04/06/2016      PT End of Session - 04/06/16 0820    Visit Number 5   Number of Visits 24   Date for PT Re-Evaluation 05/22/16   PT Start Time 0815   PT Stop Time 0917   PT Time Calculation (min) 62 min      Past Medical History  Diagnosis Date  . PE (pulmonary embolism) 1992    post op- 1 week  . Peripheral vascular disease ([place])     post op DVT-  approx. 2009, treated at [facility]   . Hypertension   . Seasonal allergies   . H/O renal calculi     has been passed spontaneously but also had cystoscopy for one   . GERD (gastroesophageal reflux disease)   . Headache     migraines on occasion - used imitrex 5- 6 yrs. ago   . Arthritis     both knees     Past Surgical History  Procedure Laterality Date  . Knee surgery      8 arthroscopies & one repair of tendon- R   . Ankle surgery Right     repair of fracture & repair of torn tendons.  . Vocal cord injection      bilateral medialization, implants on both vocal chords   . Tonsillectomy    . 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      Subjective Assessment - 04/06/16 0817    Subjective Md said my LT knee looked good and to cont. with PT and go back to work 4 hrs a day 5 days a week   Limitations Walking   How long can you walk comfortably? Short distances.   Patient Stated Goals Get back to an active lifestyle.   Currently in Pain? Yes   Pain Score 4    Pain Location Knee   Pain Orientation Left   Pain Descriptors / Indicators Tightness   Pain Type Surgical pain                         [facility] Adult PT Treatment/Exercise - 04/06/16 0001    Exercises   Exercises Knee/Hip   Knee/Hip Exercises: Aerobic   Nustep L5, seat 12, 11 x16  min   Knee/Hip Exercises: Standing   Forward Lunges Left;2 sets;10 reps;5 seconds  14" step   Forward Step Up Left;3 sets;10 reps;Step Height: 6"   Rocker Board 3 minutes  calf stretching   Other Standing Knee Exercises Retro stepping on LT LE to activate Quad x 10   Modalities   Modalities Vasopneumatic;Electrical Stimulation   Electrical Stimulation   Electrical Stimulation Location L knee    Electrical Stimulation Action IFC   Electrical Stimulation Parameters 1-10hz    Electrical Stimulation Goals Edema;Pain   Vasopneumatic   Number Minutes Vasopneumatic  15 minutes   Vasopnuematic Location  Knee   Vasopneumatic Pressure Medium   Vasopneumatic Temperature  34   Manual Therapy   Manual Therapy Soft tissue mobilization   Soft tissue mobilization  L patellar mobilizations in sup/inf, L/R to decrease adhesions that may be present; L incisoin mobiliations in different directions to promote proper mobility   Passive ROM PROM of L knee into ext/flex with  holds at end range                   PT Short Term Goals - 04/04/16 0850    PT SHORT TERM GOAL #1   Title Ind with an initial HEP.   Time 4   Period Weeks   Status Achieved   PT SHORT TERM GOAL #2   Title Achieve full left knee extension to normalize gait.   Time 4   Period Weeks   Status On-going  AROM L knee extension -20 deg from neutral 04/04/2016   PT SHORT TERM GOAL #3   Title Active left knee flexion to 90 degrees.   Time 4   Period Weeks   Status Achieved  AROM L knee flexion 94 deg 04/04/2016           PT Long Term Goals - 04/04/16 0841    PT LONG TERM GOAL #1   Title Ind with an advanced HEP.   Time 8   Period Weeks   Status On-going   PT LONG TERM GOAL #2   Title Active left knee  flexion to 115 degrees+ so the patient can perform functional tasks and do so with pain not > 2-3/10.   Time 8   Period Weeks   Status On-going  AROM L knee flexion 94 deg 04/04/2016   PT LONG TERM GOAL #3   Title Decrease edema to within 2.5 cms of non-affected side to assist with pain reduction and range of motion gains.   Time 8   Period Weeks   Status Achieved  1.4 cm difference edema with L>R 04/04/2016   PT LONG TERM GOAL #4   Title Increase left knee strength to a solid 5/5 to provide good stability for accomplishment of functional activities   Time 8   Period Weeks   Status On-going   PT LONG TERM GOAL #5   Title Perform a reciprocating stair gait with one railing with pain not > 2-3/10.   Time 8   Period Weeks   Status On-going               Plan - 04/06/16 0827    Clinical Impression Statement  Pt went to MD yesterday and Pt states that MD was pleased and to cont. with PT. Pt did great today and was able to complete all exs and Acts for LT LE with minimal pain increase. His ROM was about the same 15-94 degrees today. Goals are ongoing   Clinical Impairments Affecting Rehab Potential Multiple previous knee surgeries and 2 significant setbacks since surggery.   PT Frequency 3x / week   PT Duration 8 weeks   PT Treatment/Interventions ADLs/Self Care Home Management;Cryotherapy;Electrical Stimulation;Moist Heat;Therapeutic exercise;Therapeutic activities;Stair training;Gait training;Neuromuscular re-education;Patient/family education;Manual techniques;Passive range of motion;Vasopneumatic Device   PT Next Visit Plan Continue with TKR protocol per MPT POC with modalities for pain PRN; initiate further strengthening next treatment.and PROM   PT Home Exercise Plan HEP- HS stretch, forward lunges and verbal education regarding prone hangs   Consulted and Agree with Plan of Care Patient      Patient will benefit from skilled therapeutic intervention in order to improve the  following deficits and impairments:  Abnormal gait, Decreased activity tolerance, Pain, Increased edema, Decreased strength, Decreased  range of motion  Visit Diagnosis: Pain in left knee  Stiffness of knee joint, left     Problem List Patient Active Problem List   Diagnosis Date Noted  . History of pulmonary embolism 03/06/2016  . Primary osteoarthritis of left knee 03/05/2016    Nalany Steedley,[name], PTA 04/06/2016, 9:18 AM  [facility] [place], [place], [address] Phone: [phone]   Fax:  [phone]  Name: Jeremy [name] MRN: [id number] Date of Birth: 1961/01/16

## 2016-04-10 ENCOUNTER — Ambulatory Visit: Payer: PRIVATE HEALTH INSURANCE | Admitting: *Deleted

## 2016-04-10 DIAGNOSIS — M25562 Pain in left knee: Secondary | ICD-10-CM | POA: Diagnosis not present

## 2016-04-10 DIAGNOSIS — M25662 Stiffness of left knee, not elsewhere classified: Secondary | ICD-10-CM

## 2016-04-10 NOTE — Therapy (Signed)
[facility] [place], [place], [address] Phone: [phone]   Fax:  [phone]  Physical Therapy Treatment  Patient Details  Name: Jeremy [name] MRN: [id number] Date of Birth: 03-27-1961 Referring Provider: Dorna Leitz MD  Encounter Date: 04/10/2016      PT End of Session - 04/10/16 1703    Visit Number 6   Number of Visits 24   Date for PT Re-Evaluation 05/22/16   PT Start Time 1600   PT Stop Time 1704   PT Time Calculation (min) 64 min      Past Medical History  Diagnosis Date  . PE (pulmonary embolism) 1992    post op- 1 week  . Peripheral vascular disease ([place])     post op DVT-  approx. 2009, treated at [facility]   . Hypertension   . Seasonal allergies   . H/O renal calculi     has been passed spontaneously but also had cystoscopy for one   . GERD (gastroesophageal reflux disease)   . Headache     migraines on occasion - used imitrex 5- 6 yrs. ago   . Arthritis     both knees     Past Surgical History  Procedure Laterality Date  . Knee surgery      8 arthroscopies & one repair of tendon- R   . Ankle surgery Right     repair of fracture & repair of torn tendons.  . Vocal cord injection      bilateral medialization, implants on both vocal chords   . Tonsillectomy    . Total knee arthroplasty Left 03/05/2016    Procedure: LEFT TOTAL KNEE ARTHROPLASTY;  Surgeon: Dorna Leitz, MD;  Location: [facility];  Service: Orthopedics;  Laterality: Left;    There were no vitals filed for this visit.      Subjective Assessment - 04/10/16 1615    Subjective Md said my LT knee looked good and to cont. with PT and go back to work 4 hrs a day 5 days a week. LT knee sore from being up at work, but doing good.   Limitations Walking   How long can you walk comfortably? Short distances.   Patient Stated Goals Get back to an active lifestyle.   Currently in Pain? Yes   Pain Score 5    Pain Location Knee   Pain Orientation  Left   Pain Descriptors / Indicators Tightness   Pain Type Surgical pain   Pain Frequency Constant                         [facility] Adult PT Treatment/Exercise - 04/10/16 0001    Exercises   Exercises Knee/Hip   Knee/Hip Exercises: Aerobic   Nustep L5, seat 12, 11, 10 x  20  min   Knee/Hip Exercises: Standing   Forward Lunges Left;2 sets;10 reps;5 seconds  14" step   Forward Step Up Left;3 sets;10 reps;Step Height: 6"   Rocker Board 3 minutes  calf stretching   Knee/Hip Exercises: Seated   Long Arc Quad Strengthening;Left;2 sets;10 reps;Weights   Long Arc Quad Weight 4 lbs.   Modalities   Modalities Vasopneumatic;Electrical Stimulation   Electrical Stimulation   Electrical Stimulation Location L knee IFC x15 mins 1-10Hz    Electrical Stimulation Goals Edema;Pain   Vasopneumatic   Number Minutes Vasopneumatic  15 minutes   Vasopnuematic Location  Knee   Vasopneumatic Pressure Medium   Vasopneumatic Temperature  34  Manual Therapy   Manual Therapy Soft tissue mobilization   Soft tissue mobilization L patellar mobilizations in sup/inf, L/R to decrease adhesions that may be present; L incisoin mobiliations in different directions to promote proper mobility   Passive ROM PROM of L knee into ext/flex with  holds at end range                   PT Short Term Goals - 04/04/16 0850    PT SHORT TERM GOAL #1   Title Ind with an initial HEP.   Time 4   Period Weeks   Status Achieved   PT SHORT TERM GOAL #2   Title Achieve full left knee extension to normalize gait.   Time 4   Period Weeks   Status On-going  AROM L knee extension -20 deg from neutral 04/04/2016   PT SHORT TERM GOAL #3   Title Active left knee flexion to 90 degrees.   Time 4   Period Weeks   Status Achieved  AROM L knee flexion 94 deg 04/04/2016           PT Long Term Goals - 04/04/16 0841    PT LONG TERM GOAL #1   Title Ind with an advanced HEP.   Time 8   Period Weeks   Status  On-going   PT LONG TERM GOAL #2   Title Active left knee flexion to 115 degrees+ so the patient can perform functional tasks and do so with pain not > 2-3/10.   Time 8   Period Weeks   Status On-going  AROM L knee flexion 94 deg 04/04/2016   PT LONG TERM GOAL #3   Title Decrease edema to within 2.5 cms of non-affected side to assist with pain reduction and range of motion gains.   Time 8   Period Weeks   Status Achieved  1.4 cm difference edema with L>R 04/04/2016   PT LONG TERM GOAL #4   Title Increase left knee strength to a solid 5/5 to provide good stability for accomplishment of functional activities   Time 8   Period Weeks   Status On-going   PT LONG TERM GOAL #5   Title Perform a reciprocating stair gait with one railing with pain not > 2-3/10.   Time 8   Period Weeks   Status On-going               Plan - 04/10/16 1704    Clinical Impression Statement Pt did fairly well today with RX. He was able to perform all LT knee exs with minimal pain increase. He started with a little more pain today after going into work for 1/2 days yesterday and today. He did good on strengthening exs, and  ROM  for flexion was at 94 degrees and extension to 12 degrees.   Clinical Impairments Affecting Rehab Potential Multiple previous knee surgeries and 2 significant setbacks since surggery.   PT Duration 8 weeks   PT Treatment/Interventions ADLs/Self Care Home Management;Cryotherapy;Electrical Stimulation;Moist Heat;Therapeutic exercise;Therapeutic activities;Stair training;Gait training;Neuromuscular re-education;Patient/family education;Manual techniques;Passive range of motion;Vasopneumatic Device   PT Home Exercise Plan HEP- HS stretch, forward lunges and verbal education regarding prone hangs   Consulted and Agree with Plan of Care Patient      Patient will benefit from skilled therapeutic intervention in order to improve the following deficits and impairments:  Abnormal gait, Decreased  activity tolerance, Pain, Increased edema, Decreased strength, Decreased range of motion  Visit Diagnosis: Pain in  left knee  Stiffness of knee joint, left     Problem List Patient Active Problem List   Diagnosis Date Noted  . History of pulmonary embolism 03/06/2016  . Primary osteoarthritis of left knee 03/05/2016    RAMSEUR,[name], PTA 04/10/2016, 5:28 PM  [facility] [address] [place], [place], [address] Phone: [phone]   Fax:  [phone]  Name: CLARO FINKEN MRN: [id number] Date of Birth: Feb 10, 1961

## 2016-04-11 ENCOUNTER — Ambulatory Visit: Payer: PRIVATE HEALTH INSURANCE | Admitting: Physical Therapy

## 2016-04-11 ENCOUNTER — Encounter: Payer: Self-pay | Admitting: Physical Therapy

## 2016-04-11 DIAGNOSIS — R600 Localized edema: Secondary | ICD-10-CM

## 2016-04-11 DIAGNOSIS — M25562 Pain in left knee: Secondary | ICD-10-CM | POA: Diagnosis not present

## 2016-04-11 DIAGNOSIS — M25662 Stiffness of left knee, not elsewhere classified: Secondary | ICD-10-CM

## 2016-04-11 NOTE — Therapy (Signed)
[facility] [place], [place], [address] Phone: [phone]   Fax:  [phone]  Physical Therapy Treatment  Patient Details  Name: Jeremy [name] MRN: [id number] Date of Birth: 09-Mar-1961 Referring Provider: Dorna Leitz MD  Encounter Date: 04/11/2016      PT End of Session - 04/11/16 1313    Visit Number 7   Number of Visits 24   Date for PT Re-Evaluation 05/22/16   PT Start Time 1312   PT Stop Time 1352   PT Time Calculation (min) 40 min   Activity Tolerance Patient tolerated treatment well   Behavior During Therapy [facility] for tasks assessed/performed      Past Medical History  Diagnosis Date  . PE (pulmonary embolism) 1992    post op- 1 week  . Peripheral vascular disease ([place])     post op DVT-  approx. 2009, treated at [facility]   . Hypertension   . Seasonal allergies   . H/O renal calculi     has been passed spontaneously but also had cystoscopy for one   . GERD (gastroesophageal reflux disease)   . Headache     migraines on occasion - used imitrex 5- 6 yrs. ago   . Arthritis     both knees     Past Surgical History  Procedure Laterality Date  . Knee surgery      8 arthroscopies & one repair of tendon- R   . Ankle surgery Right     repair of fracture & repair of torn tendons.  . Vocal cord injection      bilateral medialization, implants on both vocal chords   . Tonsillectomy    . Total knee arthroplasty Left 03/05/2016    Procedure: LEFT TOTAL KNEE ARTHROPLASTY;  Surgeon: Dorna Leitz, MD;  Location: [facility];  Service: Orthopedics;  Laterality: Left;    There were no vitals filed for this visit.      Subjective Assessment - 04/11/16 1312    Subjective Reports that his pain is not as bad as yesterday and states that is continues to have pain in the inferiomedial aspect of anterior knee. Reports that he is thinking of calling MD to see if he can return to a 32 hour work week.   Limitations Walking   How  long can you walk comfortably? Short distances.   Patient Stated Goals Get back to an active lifestyle.   Currently in Pain? Yes   Pain Score 4    Pain Location Knee   Pain Orientation Left   Pain Descriptors / Indicators Sore   Pain Type Surgical pain            [facility] PT Assessment - 04/11/16 0001    Assessment   Medical Diagnosis Left total knee replacement   Onset Date/Surgical Date 03/05/16   Next MD Visit 05/08/2016   Restrictions   Weight Bearing Restrictions No                     [facility] Adult PT Treatment/Exercise - 04/11/16 0001    Knee/Hip Exercises: Aerobic   Nustep L5, seat 11 x10 min   Knee/Hip Exercises: Standing   Forward Lunges Left;2 sets;10 reps;3 seconds;Other (comment)  14" step   Lateral Step Up Left;3 sets;10 reps;Hand Hold: 2;Step Height: 6"   Forward Step Up Left;3 sets;10 reps;Hand Hold: 2;Step Height: 6"   Rocker Board 3 minutes   Knee/Hip Exercises: Seated   Long Arc Quad Strengthening;Left;3  sets;10 reps;Weights   Long Arc Quad Weight 4 lbs.   Modalities   Modalities Vasopneumatic;Electrical Stimulation   Electrical Stimulation   Electrical Stimulation Location L knee   Electrical Stimulation Action IFC   Electrical Stimulation Parameters 1-10 Hz x15 min   Electrical Stimulation Goals Edema;Pain   Vasopneumatic   Number Minutes Vasopneumatic  15 minutes   Vasopnuematic Location  Knee   Vasopneumatic Pressure Medium   Vasopneumatic Temperature  34                  PT Short Term Goals - 04/04/16 0850    PT SHORT TERM GOAL #1   Title Ind with an initial HEP.   Time 4   Period Weeks   Status Achieved   PT SHORT TERM GOAL #2   Title Achieve full left knee extension to normalize gait.   Time 4   Period Weeks   Status On-going  AROM L knee extension -20 deg from neutral 04/04/2016   PT SHORT TERM GOAL #3   Title Active left knee flexion to 90 degrees.   Time 4   Period Weeks   Status Achieved  AROM L knee  flexion 94 deg 04/04/2016           PT Long Term Goals - 04/04/16 0841    PT LONG TERM GOAL #1   Title Ind with an advanced HEP.   Time 8   Period Weeks   Status On-going   PT LONG TERM GOAL #2   Title Active left knee flexion to 115 degrees+ so the patient can perform functional tasks and do so with pain not > 2-3/10.   Time 8   Period Weeks   Status On-going  AROM L knee flexion 94 deg 04/04/2016   PT LONG TERM GOAL #3   Title Decrease edema to within 2.5 cms of non-affected side to assist with pain reduction and range of motion gains.   Time 8   Period Weeks   Status Achieved  1.4 cm difference edema with L>R 04/04/2016   PT LONG TERM GOAL #4   Title Increase left knee strength to a solid 5/5 to provide good stability for accomplishment of functional activities   Time 8   Period Weeks   Status On-going   PT LONG TERM GOAL #5   Title Perform a reciprocating stair gait with one railing with pain not > 2-3/10.   Time 8   Period Weeks   Status On-going               Plan - 04/11/16 1338    Clinical Impression Statement Patient's treatment limited today as he arrived late secondary to work. Patient tolerated exercises well with no reports of increased pain by patient. Patient tolerated new exercises well and did well with increased repitiitions without complaint. Normal modalities response noted following removal of the modalities. Goals remain on-going secondary to lack of full L knee ROM. strength, deficits with stair ambulation. Bruising along L medial thigh now has purple coloration with yellow around the edges from CPM per patient report.   Rehab Potential Good   Clinical Impairments Affecting Rehab Potential Multiple previous knee surgeries and 2 significant setbacks since surggery.   PT Frequency 3x / week   PT Duration 8 weeks   PT Treatment/Interventions ADLs/Self Care Home Management;Cryotherapy;Electrical Stimulation;Moist Heat;Therapeutic exercise;Therapeutic  activities;Stair training;Gait training;Neuromuscular re-education;Patient/family education;Manual techniques;Passive range of motion;Vasopneumatic Device   PT Next Visit Plan Continue with TKR protocol per  MPT POC with modalities for pain PRN; initiate further strengthening next treatment.and PROM   PT Home Exercise Plan HEP- HS stretch, forward lunges and verbal education regarding prone hangs   Consulted and Agree with Plan of Care Patient      Patient will benefit from skilled therapeutic intervention in order to improve the following deficits and impairments:  Abnormal gait, Decreased activity tolerance, Pain, Increased edema, Decreased strength, Decreased range of motion  Visit Diagnosis: Pain in left knee  Stiffness of knee joint, left  Localized edema     Problem List Patient Active Problem List   Diagnosis Date Noted  . History of pulmonary embolism 03/06/2016  . Primary osteoarthritis of left knee 03/05/2016    Wynelle Fanny, PTA 04/11/2016, 2:03 PM  [facility] [place], [place], [address] Phone: [phone]   Fax:  [phone]  Name: Jeremy [name] MRN: [id number] Date of Birth: 08/13/1961

## 2016-04-13 ENCOUNTER — Encounter: Payer: Self-pay | Admitting: Physical Therapy

## 2016-04-13 ENCOUNTER — Ambulatory Visit: Payer: PRIVATE HEALTH INSURANCE | Admitting: Physical Therapy

## 2016-04-13 DIAGNOSIS — M25562 Pain in left knee: Secondary | ICD-10-CM | POA: Diagnosis not present

## 2016-04-13 DIAGNOSIS — M25662 Stiffness of left knee, not elsewhere classified: Secondary | ICD-10-CM

## 2016-04-13 DIAGNOSIS — R600 Localized edema: Secondary | ICD-10-CM

## 2016-04-13 NOTE — Therapy (Signed)
[facility] [place], [place], [address] Phone: [phone]   Fax:  [phone]  Physical Therapy Treatment  Patient Details  Name: Jeremy [name] MRN: [id number] Date of Birth: [birth year] Referring Provider: Dorna Leitz MD  Encounter Date: 04/13/2016      PT End of Session - 04/13/16 0907    Visit Number 9   Number of Visits 24   Date for PT Re-Evaluation 05/22/16   PT Start Time 0904   PT Stop Time 0954   PT Time Calculation (min) 50 min   Activity Tolerance Patient tolerated treatment well   Behavior During Therapy [facility] for tasks assessed/performed      Past Medical History  Diagnosis Date  . PE (pulmonary embolism) 1992    post op- 1 week  . Peripheral vascular disease ([place])     post op DVT-  approx. 2009, treated at [facility]   . Hypertension   . Seasonal allergies   . H/O renal calculi     has been passed spontaneously but also had cystoscopy for one   . GERD (gastroesophageal reflux disease)   . Headache     migraines on occasion - used imitrex 5- 6 yrs. ago   . Arthritis     both knees     Past Surgical History  Procedure Laterality Date  . Knee surgery      8 arthroscopies & one repair of tendon- R   . Ankle surgery Right     repair of fracture & repair of torn tendons.  . Vocal cord injection      bilateral medialization, implants on both vocal chords   . Tonsillectomy    . 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      Subjective Assessment - 04/13/16 0907    Subjective Reports that he got the okay from his MD to return to work next week. Reports tightness in L knee this morning.   Limitations Walking   How long can you walk comfortably? Short distances.   Patient Stated Goals Get back to an active lifestyle.             [facility] PT Assessment - 04/13/16 0001    Assessment   Medical Diagnosis Left total knee replacement   Onset Date/Surgical Date 03/05/16   Next MD Visit 05/08/2016   Restrictions   Weight Bearing Restrictions No   ROM / Strength   AROM / PROM / Strength AROM   AROM   Overall AROM  Deficits   AROM Assessment Site Knee   Right/Left Knee Left   Left Knee Extension -12   Left Knee Flexion 95                     [facility] Adult PT Treatment/Exercise - 04/13/16 0001    Knee/Hip Exercises: Aerobic   Nustep L5, seat 10 x10 min   Knee/Hip Exercises: Standing   Forward Lunges Left;2 sets;10 reps;3 seconds;Other (comment)  16" step   Lateral Step Up Left;3 sets;10 reps;Hand Hold: 2;Step Height: 6"   Forward Step Up Left;3 sets;10 reps;Hand Hold: 2;Step Height: 6"   Rocker Board 3 minutes   Knee/Hip Exercises: Seated   Long Arc Quad Strengthening;Left;3 sets;10 reps;Weights   Long Arc Quad Weight 4 lbs.   Modalities  Modalities Vasopneumatic;Electrical Stimulation   Acupuncturist Location L knee   Electrical Stimulation Action IFC   Electrical Stimulation Parameters 1-10 Hz x15 min   Electrical Stimulation Goals Edema;Pain   Vasopneumatic   Number Minutes Vasopneumatic  15 minutes   Vasopnuematic Location  Knee   Vasopneumatic Pressure Medium   Vasopneumatic Temperature  34   Manual Therapy   Manual Therapy Passive ROM   Passive ROM PROM of L knee into ext/flex with  holds at end range                   PT Short Term Goals - 04/13/16 0941    PT SHORT TERM GOAL #1   Title Ind with an initial HEP.   Time 4   Period Weeks   Status Achieved   PT SHORT TERM GOAL #2   Title Achieve full left knee extension to normalize gait.   Time 4   Period Weeks   Status On-going  AROM L knee extension -12 deg from neutral 04/13/2016   PT SHORT TERM GOAL #3   Title Active left knee flexion to 90 degrees.   Time 4   Period  Weeks   Status Achieved  AROM L knee flexion 94 deg 04/04/2016           PT Long Term Goals - 04/13/16 0941    PT LONG TERM GOAL #1   Title Ind with an advanced HEP.   Time 8   Period Weeks   Status On-going   PT LONG TERM GOAL #2   Title Active left knee flexion to 115 degrees+ so the patient can perform functional tasks and do so with pain not > 2-3/10.   Time 8   Period Weeks   Status On-going  AROM L knee flexion 95 deg 04/13/2016   PT LONG TERM GOAL #3   Title Decrease edema to within 2.5 cms of non-affected side to assist with pain reduction and range of motion gains.   Time 8   Period Weeks   Status Achieved  1.4 cm difference edema with L>R 04/04/2016   PT LONG TERM GOAL #4   Title Increase left knee strength to a solid 5/5 to provide good stability for accomplishment of functional activities   Time 8   Period Weeks   Status On-going   PT LONG TERM GOAL #5   Title Perform a reciprocating stair gait with one railing with pain not > 2-3/10.   Time 8   Period Weeks   Status On-going               Plan - 04/13/16 [id number]    Clinical Impression Statement Patient tolerated today's treatment very well although he arrived at clinic with increased L knee tightness. Tolerated exercises very well with minimal multimodal cueing for exercise technique. Patient demonstrated improved AROM of L knee in L knee today as evidenced by measurements. AROM L knee flexion 95 deg in supine, ext 12 deg in supine. Normal modalities response noted following removal of the modalities. No new goals were achieved today although advancements were made towards L knee ROM goals. Patient reports reduced pain at end of treatment.   Rehab Potential Good   Clinical Impairments Affecting Rehab Potential Multiple previous knee surgeries and 2 significant setbacks since surggery.   PT Frequency 3x / week   PT Duration 8 weeks   PT Treatment/Interventions ADLs/Self Care Home  Management;Cryotherapy;Electrical Stimulation;Moist Heat;Therapeutic exercise;Therapeutic activities;Stair training;Gait  training;Neuromuscular re-education;Patient/family education;Manual techniques;Passive range of motion;Vasopneumatic Device   PT Next Visit Plan Continue with TKR protocol per MPT POC with modalities for pain PRN; initiate further strengthening next treatment.and PROM   PT Home Exercise Plan HEP- HS stretch, forward lunges and verbal education regarding prone hangs   Consulted and Agree with Plan of Care Patient      Patient will benefit from skilled therapeutic intervention in order to improve the following deficits and impairments:  Abnormal gait, Decreased activity tolerance, Pain, Increased edema, Decreased strength, Decreased range of motion  Visit Diagnosis: Pain in left knee  Stiffness of knee joint, left  Localized edema     Problem List Patient Active Problem List   Diagnosis Date Noted  . History of pulmonary embolism 03/06/2016  . Primary osteoarthritis of left knee 03/05/2016    Wynelle Fanny, PTA 04/13/2016, 9:58 AM  [facility] [address] [place], [place], [address] Phone: [phone]   Fax:  [phone]  Name: ADREAN MOLZAHN MRN: [id number] Date of Birth: 1961/11/24

## 2016-04-16 ENCOUNTER — Encounter: Payer: Self-pay | Admitting: Physical Therapy

## 2016-04-16 ENCOUNTER — Ambulatory Visit: Payer: PRIVATE HEALTH INSURANCE | Admitting: Physical Therapy

## 2016-04-16 DIAGNOSIS — M25562 Pain in left knee: Secondary | ICD-10-CM

## 2016-04-16 DIAGNOSIS — R600 Localized edema: Secondary | ICD-10-CM

## 2016-04-16 DIAGNOSIS — M25662 Stiffness of left knee, not elsewhere classified: Secondary | ICD-10-CM

## 2016-04-16 NOTE — Therapy (Signed)
[facility] [place], [place], [address] Phone: [phone]   Fax:  [phone]  Physical Therapy Treatment  Patient Details  Name: Jeremy [name] MRN: [id number] Date of Birth: [birth year] Referring Provider: Dorna Leitz MD  Encounter Date: 04/16/2016      PT End of Session - 04/16/16 0740    Visit Number 10   Number of Visits 24   Date for PT Re-Evaluation 05/22/16   PT Start Time 0743   PT Stop Time 0828   PT Time Calculation (min) 45 min   Activity Tolerance Patient tolerated treatment well   Behavior During Therapy [facility] for tasks assessed/performed      Past Medical History  Diagnosis Date  . PE (pulmonary embolism) 1992    post op- 1 week  . Peripheral vascular disease ([place])     post op DVT-  approx. 2009, treated at [facility]   . Hypertension   . Seasonal allergies   . H/O renal calculi     has been passed spontaneously but also had cystoscopy for one   . GERD (gastroesophageal reflux disease)   . Headache     migraines on occasion - used imitrex 5- 6 yrs. ago   . Arthritis     both knees     Past Surgical History  Procedure Laterality Date  . Knee surgery      8 arthroscopies & one repair of tendon- R   . Ankle surgery Right     repair of fracture & repair of torn tendons.  . Vocal cord injection      bilateral medialization, implants on both vocal chords   . Tonsillectomy    . 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      Subjective Assessment - 04/16/16 0743    Subjective Reports increased L knee tightness and soreness secondary to "overdoing it" yesterday with mowing and weedeating.   Limitations Walking   How long can you walk comfortably? Short distances.   Patient Stated Goals Get back to an active lifestyle.    Currently in Pain? Yes   Pain Score 4    Pain Location Knee   Pain Orientation Left   Pain Descriptors / Indicators Sore;Tightness   Pain Type Surgical pain            [facility] PT Assessment - 04/16/16 0001    Assessment   Medical Diagnosis Left total knee replacement   Onset Date/Surgical Date 03/05/16   Next MD Visit 05/08/2016   Restrictions   Weight Bearing Restrictions No                     [facility] Adult PT Treatment/Exercise - 04/16/16 0001    Knee/Hip Exercises: Aerobic   Nustep L6, seat 12  x10 min   Knee/Hip Exercises: Standing   Forward Lunges Left;2 sets;10 reps;3 seconds;Other (comment)  14" step   Lateral Step Up Left;3 sets;10 reps;Hand Hold: 2;Step Height: 6"   Forward Step Up Left;3 sets;10 reps;Hand Hold: 2;Step Height: 6"   Knee/Hip Exercises: Seated   Long Arc Quad Strengthening;Left;3 sets;10 reps;Weights   Long Arc Quad Weight 4 lbs.   Modalities   Modalities Vasopneumatic;Electrical Stimulation   Acupuncturist Location L knee   Electrical Stimulation Action  IFC   Electrical Stimulation Parameters 1-10 Hz x15 min   Electrical Stimulation Goals Edema;Pain   Vasopneumatic   Number Minutes Vasopneumatic  15 minutes   Vasopnuematic Location  Knee   Vasopneumatic Pressure Medium   Vasopneumatic Temperature  73   Manual Therapy   Manual Therapy Myofascial release   Myofascial Release IASTW to B sides of L knee incision to decrease tightness in long sitting                  PT Short Term Goals - 04/13/16 0941    PT SHORT TERM GOAL #1   Title Ind with an initial HEP.   Time 4   Period Weeks   Status Achieved   PT SHORT TERM GOAL #2   Title Achieve full left knee extension to normalize gait.   Time 4   Period Weeks   Status On-going  AROM L knee extension -12 deg from neutral 04/13/2016   PT SHORT TERM GOAL #3   Title Active left knee flexion to 90 degrees.   Time 4   Period Weeks   Status  Achieved  AROM L knee flexion 94 deg 04/04/2016           PT Long Term Goals - 04/13/16 0941    PT LONG TERM GOAL #1   Title Ind with an advanced HEP.   Time 8   Period Weeks   Status On-going   PT LONG TERM GOAL #2   Title Active left knee flexion to 115 degrees+ so the patient can perform functional tasks and do so with pain not > 2-3/10.   Time 8   Period Weeks   Status On-going  AROM L knee flexion 95 deg 04/13/2016   PT LONG TERM GOAL #3   Title Decrease edema to within 2.5 cms of non-affected side to assist with pain reduction and range of motion gains.   Time 8   Period Weeks   Status Achieved  1.4 cm difference edema with L>R 04/04/2016   PT LONG TERM GOAL #4   Title Increase left knee strength to a solid 5/5 to provide good stability for accomplishment of functional activities   Time 8   Period Weeks   Status On-going   PT LONG TERM GOAL #5   Title Perform a reciprocating stair gait with one railing with pain not > 2-3/10.   Time 8   Period Weeks   Status On-going               Plan - 04/16/16 0820    Clinical Impression Statement Patient tolerated today's treatment fairly well although he arrived at treatment with increased L knee soreness and tightness secondary to overdoing yardwork yesterday. Patient reported tightness throughout the L knee but primiarily in mid incision area bilaterally. Completes all exercises as directed and requires very little VCs for exercise technique with no deviations noted .Continues to ambulatie with decreased L knee flexion at this time. Patient tolerated sitting L knee IASTW to B sides of L knee incision with redness observated along both sides of incision but predominately along lateral incision. A small area of L knee incision in the  superior most aspect of incision that bled minimally during treatment that was just a small area that had no healed completely. Normal modalites response noted following removal of the modalities.    Rehab Potential Good   Clinical Impairments Affecting Rehab Potential Multiple previous knee surgeries and 2 significant setbacks since surggery.  PT Frequency 3x / week   PT Duration 8 weeks   PT Treatment/Interventions ADLs/Self Care Home Management;Cryotherapy;Electrical Stimulation;Moist Heat;Therapeutic exercise;Therapeutic activities;Stair training;Gait training;Neuromuscular re-education;Patient/family education;Manual techniques;Passive range of motion;Vasopneumatic Device   PT Next Visit Plan Continue with TKR protocol per MPT POC with modalities for pain PRN; initiate further strengthening next treatment.and PROM   PT Home Exercise Plan HEP- HS stretch, forward lunges and verbal education regarding prone hangs   Consulted and Agree with Plan of Care Patient      Patient will benefit from skilled therapeutic intervention in order to improve the following deficits and impairments:  Abnormal gait, Decreased activity tolerance, Pain, Increased edema, Decreased strength, Decreased range of motion  Visit Diagnosis: Pain in left knee  Stiffness of knee joint, left  Localized edema     Problem List Patient Active Problem List   Diagnosis Date Noted  . History of pulmonary embolism 03/06/2016  . Primary osteoarthritis of left knee 03/05/2016    Wynelle Fanny, PTA 04/16/2016, 8:32 AM  [facility] [address] [place], [place], [address] Phone: [phone]   Fax:  [phone]  Name: Jeremy [name] MRN: [id number] Date of Birth: 04-23-1961

## 2016-04-18 ENCOUNTER — Encounter: Payer: PRIVATE HEALTH INSURANCE | Admitting: Physical Therapy

## 2016-04-19 ENCOUNTER — Encounter: Payer: Self-pay | Admitting: Physical Therapy

## 2016-04-19 ENCOUNTER — Ambulatory Visit: Payer: PRIVATE HEALTH INSURANCE | Admitting: Physical Therapy

## 2016-04-19 DIAGNOSIS — M25662 Stiffness of left knee, not elsewhere classified: Secondary | ICD-10-CM

## 2016-04-19 DIAGNOSIS — R600 Localized edema: Secondary | ICD-10-CM

## 2016-04-19 DIAGNOSIS — M25562 Pain in left knee: Secondary | ICD-10-CM | POA: Diagnosis not present

## 2016-04-19 NOTE — Therapy (Signed)
[facility] [place], [place], [address] Phone: [phone]   Fax:  [phone]  Physical Therapy Treatment  Patient Details  Name: Jeremy [name] MRN: [id number] Date of Birth: 1961/02/21 Referring Provider: Dorna Leitz MD  Encounter Date: 04/19/2016      PT End of Session - 04/19/16 0730    Visit Number 11   Number of Visits 24   Date for PT Re-Evaluation 05/22/16   PT Start Time 0729   PT Stop Time 0821   PT Time Calculation (min) 52 min   Activity Tolerance Patient tolerated treatment well   Behavior During Therapy [facility] for tasks assessed/performed      Past Medical History  Diagnosis Date  . PE (pulmonary embolism) 1992    post op- 1 week  . Peripheral vascular disease ([place])     post op DVT-  approx. 2009, treated at [facility]   . Hypertension   . Seasonal allergies   . H/O renal calculi     has been passed spontaneously but also had cystoscopy for one   . GERD (gastroesophageal reflux disease)   . Headache     migraines on occasion - used imitrex 5- 6 yrs. ago   . Arthritis     both knees     Past Surgical History  Procedure Laterality Date  . Knee surgery      8 arthroscopies & one repair of tendon- R   . Ankle surgery Right     repair of fracture & repair of torn tendons.  . Vocal cord injection      bilateral medialization, implants on both vocal chords   . Tonsillectomy    . Total knee arthroplasty Left 03/05/2016    Procedure: LEFT TOTAL KNEE ARTHROPLASTY;  Surgeon: Dorna Leitz, MD;  Location: [place];  Service: Orthopedics;  Laterality: Left;    There were no vitals filed for this visit.      Subjective Assessment - 04/19/16 0730    Subjective Reports he feels like he is moving his knee better but still has some soreness. Reports that he may have laid on his knees wrong last night as the medioinferior region of L knee is hurting today.   Limitations Walking   How long can you walk  comfortably? Short distances.   Patient Stated Goals Get back to an active lifestyle.   Currently in Pain? Yes   Pain Score 4    Pain Location Knee   Pain Orientation Left   Pain Type Surgical pain            [facility] PT Assessment - 04/19/16 0001    Assessment   Medical Diagnosis Left total knee replacement   Onset Date/Surgical Date 03/05/16   Next MD Visit 05/08/2016   Restrictions   Weight Bearing Restrictions No   ROM / Strength   AROM / PROM / Strength AROM   AROM   Overall AROM  Deficits   AROM Assessment Site Knee   Right/Left Knee Left   Left Knee Extension -9   Left Knee Flexion 105                     [facility] Adult PT Treatment/Exercise - 04/19/16 0001    Knee/Hip Exercises: Aerobic   Nustep L6, seat 10  x10 min   Knee/Hip Exercises: Standing   Forward Lunges Left;2 sets;10 reps;3 seconds;Other (comment)  14" step   Lateral Step Up Left;3 sets;10 reps;Hand Hold: 2;Step  Height: 8"   Forward Step Up Left;3 sets;10 reps;Hand Hold: 2;Step Height: 8"   Rocker Board 3 minutes   Knee/Hip Exercises: Seated   Long Arc Quad Strengthening;Left;3 sets;10 reps;Weights   Long Arc Quad Weight 4 lbs.   Knee/Hip Exercises: Supine   Straight Leg Raises Strengthening;Left;2 sets;10 reps   Modalities   Modalities Vasopneumatic;Electrical Stimulation   Electrical Stimulation   Electrical Stimulation Location L knee   Electrical Stimulation Action IFC   Electrical Stimulation Parameters 1-10 Hz x15 min   Electrical Stimulation Goals Edema;Pain   Vasopneumatic   Number Minutes Vasopneumatic  15 minutes   Vasopnuematic Location  Knee   Vasopneumatic Pressure Medium   Manual Therapy   Manual Therapy Passive ROM   Passive ROM PROM of L knee into ext/flex with  holds at end range                   PT Short Term Goals - 04/13/16 0941    PT SHORT TERM GOAL #1   Title Ind with an initial HEP.   Time 4   Period Weeks   Status Achieved   PT SHORT TERM  GOAL #2   Title Achieve full left knee extension to normalize gait.   Time 4   Period Weeks   Status On-going  AROM L knee extension -12 deg from neutral 04/13/2016   PT SHORT TERM GOAL #3   Title Active left knee flexion to 90 degrees.   Time 4   Period Weeks   Status Achieved  AROM L knee flexion 94 deg 04/04/2016           PT Long Term Goals - 04/13/16 0941    PT LONG TERM GOAL #1   Title Ind with an advanced HEP.   Time 8   Period Weeks   Status On-going   PT LONG TERM GOAL #2   Title Active left knee flexion to 115 degrees+ so the patient can perform functional tasks and do so with pain not > 2-3/10.   Time 8   Period Weeks   Status On-going  AROM L knee flexion 95 deg 04/13/2016   PT LONG TERM GOAL #3   Title Decrease edema to within 2.5 cms of non-affected side to assist with pain reduction and range of motion gains.   Time 8   Period Weeks   Status Achieved  1.4 cm difference edema with L>R 04/04/2016   PT LONG TERM GOAL #4   Title Increase left knee strength to a solid 5/5 to provide good stability for accomplishment of functional activities   Time 8   Period Weeks   Status On-going   PT LONG TERM GOAL #5   Title Perform a reciprocating stair gait with one railing with pain not > 2-3/10.   Time 8   Period Weeks   Status On-going               Plan - 04/19/16 0806    Clinical Impression Statement Patient continues to progress slowly regarding L knee ROM as it was measured at 9-105 deg in supine actively. Patient able to tolerate advancing step up heights to 8" step without complaint. Patient did report that he was experiencing medioinferior L knee pain for an unknown reason. Cotnnues to have a very small spot in inferior most region of incision that remains open at this time. Normal modalites response noted following removal of the modalities.   Rehab Potential Good   Clinical  Impairments Affecting Rehab Potential Multiple previous knee surgeries and 2  significant setbacks since surggery.   PT Frequency 3x / week   PT Duration 8 weeks   PT Treatment/Interventions ADLs/Self Care Home Management;Cryotherapy;Electrical Stimulation;Moist Heat;Therapeutic exercise;Therapeutic activities;Stair training;Gait training;Neuromuscular re-education;Patient/family education;Manual techniques;Passive range of motion;Vasopneumatic Device   PT Next Visit Plan Continue with TKR protocol per MPT POC with modalities for pain PRN; initiate further strengthening next treatment.and PROM   PT Home Exercise Plan HEP- HS stretch, forward lunges and verbal education regarding prone hangs   Consulted and Agree with Plan of Care Patient      Patient will benefit from skilled therapeutic intervention in order to improve the following deficits and impairments:  Abnormal gait, Decreased activity tolerance, Pain, Increased edema, Decreased strength, Decreased range of motion  Visit Diagnosis: Pain in left knee  Stiffness of knee joint, left  Localized edema     Problem List Patient Active Problem List   Diagnosis Date Noted  . History of pulmonary embolism 03/06/2016  . Primary osteoarthritis of left knee 03/05/2016    Wynelle Fanny, PTA 04/19/2016, 8:50 AM  [facility] [address] [place], [place], [address] Phone: [phone]   Fax:  [phone]  Name: Jeremy [name] MRN: [id number] Date of Birth: [birth year]

## 2016-04-20 ENCOUNTER — Encounter: Payer: Self-pay | Admitting: Physical Therapy

## 2016-04-20 ENCOUNTER — Ambulatory Visit: Payer: PRIVATE HEALTH INSURANCE | Admitting: Physical Therapy

## 2016-04-20 DIAGNOSIS — M25562 Pain in left knee: Secondary | ICD-10-CM | POA: Diagnosis not present

## 2016-04-20 DIAGNOSIS — M25662 Stiffness of left knee, not elsewhere classified: Secondary | ICD-10-CM

## 2016-04-20 DIAGNOSIS — R600 Localized edema: Secondary | ICD-10-CM

## 2016-04-20 NOTE — Therapy (Signed)
[facility] [place], [place], [address] Phone: [phone]   Fax:  [phone]  Physical Therapy Treatment  Patient Details  Name: Jeremy [name] MRN: [id number] Date of Birth: June 13, 1961 Referring Provider: Dorna Leitz MD  Encounter Date: 04/20/2016      PT End of Session - 04/20/16 0739    Visit Number 12   Number of Visits 24   Date for PT Re-Evaluation 05/22/16   PT Start Time 0738   PT Stop Time 0820   PT Time Calculation (min) 42 min   Activity Tolerance Patient tolerated treatment well   Behavior During Therapy [facility] for tasks assessed/performed      Past Medical History  Diagnosis Date  . PE (pulmonary embolism) 1992    post op- 1 week  . Peripheral vascular disease ([place])     post op DVT-  approx. 2009, treated at [facility]   . Hypertension   . Seasonal allergies   . H/O renal calculi     has been passed spontaneously but also had cystoscopy for one   . GERD (gastroesophageal reflux disease)   . Headache     migraines on occasion - used imitrex 5- 6 yrs. ago   . Arthritis     both knees     Past Surgical History  Procedure Laterality Date  . Knee surgery      8 arthroscopies & one repair of tendon- R   . Ankle surgery Right     repair of fracture & repair of torn tendons.  . Vocal cord injection      bilateral medialization, implants on both vocal chords   . Tonsillectomy    . 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      Subjective Assessment - 04/20/16 0738    Subjective Reports that his knee is not as bad today. States that he had some increased pain and tightness yesterday when he was siting at lot.   Limitations Walking   How long can you walk comfortably? Short distances.   Patient Stated Goals Get back to an  active lifestyle.   Currently in Pain? Yes   Pain Score 3    Pain Location Knee   Pain Orientation Left   Pain Type Surgical pain            [facility] PT Assessment - 04/20/16 0001    Assessment   Medical Diagnosis Left total knee replacement   Onset Date/Surgical Date 03/05/16   Next MD Visit 05/08/2016   Restrictions   Weight Bearing Restrictions No                     [facility] Adult PT Treatment/Exercise - 04/20/16 0001    Knee/Hip Exercises: Aerobic   Nustep L6, seat 10  x10 min   Knee/Hip Exercises: Standing   Forward Lunges Left;2 sets;10 reps;3 seconds;Other (comment)  14" step   Lateral Step Up Left;3 sets;10 reps;Hand Hold: 2;Step Height: 8"   Forward Step Up Left;3 sets;10 reps;Hand Hold: 2;Step Height: 8"   Knee/Hip Exercises: Seated   Long Arc Quad Strengthening;Left;3 sets;10 reps;Weights   Long Arc Quad Weight 4 lbs.   Modalities   Modalities Vasopneumatic;Electrical Stimulation   Acupuncturist Location L knee   Electrical  Stimulation Action IFC   Electrical Stimulation Parameters 1-10 Hz x15 min   Electrical Stimulation Goals Edema;Pain   Vasopneumatic   Number Minutes Vasopneumatic  15 minutes   Vasopnuematic Location  Knee   Vasopneumatic Pressure Medium   Vasopneumatic Temperature  69                  PT Short Term Goals - 04/13/16 0941    PT SHORT TERM GOAL #1   Title Ind with an initial HEP.   Time 4   Period Weeks   Status Achieved   PT SHORT TERM GOAL #2   Title Achieve full left knee extension to normalize gait.   Time 4   Period Weeks   Status On-going  AROM L knee extension -12 deg from neutral 04/13/2016   PT SHORT TERM GOAL #3   Title Active left knee flexion to 90 degrees.   Time 4   Period Weeks   Status Achieved  AROM L knee flexion 94 deg 04/04/2016           PT Long Term Goals - 04/13/16 0941    PT LONG TERM GOAL #1   Title Ind with an advanced HEP.   Time 8   Period  Weeks   Status On-going   PT LONG TERM GOAL #2   Title Active left knee flexion to 115 degrees+ so the patient can perform functional tasks and do so with pain not > 2-3/10.   Time 8   Period Weeks   Status On-going  AROM L knee flexion 95 deg 04/13/2016   PT LONG TERM GOAL #3   Title Decrease edema to within 2.5 cms of non-affected side to assist with pain reduction and range of motion gains.   Time 8   Period Weeks   Status Achieved  1.4 cm difference edema with L>R 04/04/2016   PT LONG TERM GOAL #4   Title Increase left knee strength to a solid 5/5 to provide good stability for accomplishment of functional activities   Time 8   Period Weeks   Status On-going   PT LONG TERM GOAL #5   Title Perform a reciprocating stair gait with one railing with pain not > 2-3/10.   Time 8   Period Weeks   Status On-going               Plan - 04/20/16 0806    Clinical Impression Statement Patient continues to tolerate treatments well and had no complaints with 8" step ups. Patient continues to experience L medial knee pain just superior to joint line.  Patient continues to present with edema predominately around L patella and a small circle of inflammation is observed at the point of pain approximately size of fifty cent piece. Patient notes that pain is mostly upon returning to flexion during [place]. Normal modalities response noted following removal of the modaltiies. Goals remain on-going at this time secondary to lack of full L knee ROM and strength, difficulty with stair ambulation. Patient denied pain following today's treatment.   Rehab Potential Good   Clinical Impairments Affecting Rehab Potential Multiple previous knee surgeries and 2 significant setbacks since surggery.   PT Frequency 3x / week   PT Duration 8 weeks   PT Treatment/Interventions ADLs/Self Care Home Management;Cryotherapy;Electrical Stimulation;Moist Heat;Therapeutic exercise;Therapeutic activities;Stair training;Gait  training;Neuromuscular re-education;Patient/family education;Manual techniques;Passive range of motion;Vasopneumatic Device   PT Next Visit Plan Continue per TKR protocol and MPT POC and intitate machine strengthening next treatment.  PT Home Exercise Plan HEP- HS stretch, forward lunges and verbal education regarding prone hangs   Consulted and Agree with Plan of Care Patient      Patient will benefit from skilled therapeutic intervention in order to improve the following deficits and impairments:  Abnormal gait, Decreased activity tolerance, Pain, Increased edema, Decreased strength, Decreased range of motion  Visit Diagnosis: Pain in left knee  Stiffness of knee joint, left  Localized edema     Problem List Patient Active Problem List   Diagnosis Date Noted  . History of pulmonary embolism 03/06/2016  . Primary osteoarthritis of left knee 03/05/2016    Wynelle Fanny, PTA 04/20/2016, 8:33 AM  [facility] [place], [place], [address] Phone: [phone]   Fax:  [phone]  Name: Jeremy [name] MRN: [id number] Date of Birth: Aug 19, 1961

## 2016-04-23 ENCOUNTER — Encounter: Payer: Self-pay | Admitting: Physical Therapy

## 2016-04-23 ENCOUNTER — Ambulatory Visit: Payer: PRIVATE HEALTH INSURANCE | Admitting: Physical Therapy

## 2016-04-23 DIAGNOSIS — R600 Localized edema: Secondary | ICD-10-CM

## 2016-04-23 DIAGNOSIS — M25662 Stiffness of left knee, not elsewhere classified: Secondary | ICD-10-CM

## 2016-04-23 DIAGNOSIS — M25562 Pain in left knee: Secondary | ICD-10-CM

## 2016-04-23 NOTE — Therapy (Signed)
[facility] [place], [place], [address] Phone: [phone]   Fax:  [phone]  Physical Therapy Treatment  Patient Details  Name: Jeremy [name] MRN: [id number] Date of Birth: October 03, 1961 Referring Provider: Dorna Leitz MD  Encounter Date: 04/23/2016      PT End of Session - 04/23/16 0737    Visit Number 13   Number of Visits 24   Date for PT Re-Evaluation 05/22/16   PT Start Time 0738   PT Stop Time 0832   PT Time Calculation (min) 54 min   Activity Tolerance Patient tolerated treatment well   Behavior During Therapy [facility] for tasks assessed/performed      Past Medical History  Diagnosis Date  . PE (pulmonary embolism) 1992    post op- 1 week  . Peripheral vascular disease ([place])     post op DVT-  approx. 2009, treated at [facility]   . Hypertension   . Seasonal allergies   . H/O renal calculi     has been passed spontaneously but also had cystoscopy for one   . GERD (gastroesophageal reflux disease)   . Headache     migraines on occasion - used imitrex 5- 6 yrs. ago   . Arthritis     both knees     Past Surgical History  Procedure Laterality Date  . Knee surgery      8 arthroscopies & one repair of tendon- R   . Ankle surgery Right     repair of fracture & repair of torn tendons.  . Vocal cord injection      bilateral medialization, implants on both vocal chords   . Tonsillectomy    . Total knee arthroplasty Left 03/05/2016    Procedure: LEFT TOTAL KNEE ARTHROPLASTY;  Surgeon: Dorna Leitz, MD;  Location: [place];  Service: Orthopedics;  Laterality: Left;    There were no vitals filed for this visit.      Subjective Assessment - 04/23/16 0737    Subjective Reports he may need a short break at 8 am due to conference call for work.   Limitations Walking   How long can you walk comfortably? Short distances.   Patient Stated Goals Get back to an active lifestyle.   Currently in Pain? Yes   Pain Score 3    Pain Location Knee   Pain Orientation Left   Pain Descriptors / Indicators Aching;Sore   Pain Type Surgical pain            [facility] PT Assessment - 04/23/16 0001    Assessment   Medical Diagnosis Left total knee replacement   Onset Date/Surgical Date 03/05/16   Next MD Visit 05/08/2016   Restrictions   Weight Bearing Restrictions No                     [facility] Adult PT Treatment/Exercise - 04/23/16 0001    Knee/Hip Exercises: Aerobic   Nustep L3, seat 9 x10 min   Knee/Hip Exercises: Machines for Strengthening   Cybex Knee Extension 10# 3x10 reps   Cybex Knee Flexion 30# 3x10 reps   Cybex Leg Press 2pl, seat 6, 3x10 reps   Knee/Hip Exercises: Standing   Forward Lunges Left;2 sets;10 reps;3 seconds;Other (comment)  14" step   Modalities   Modalities Vasopneumatic;Electrical Stimulation   Electrical Stimulation   Electrical Stimulation Location L knee   Electrical Stimulation Action IFC   Electrical Stimulation Parameters 1-10 Hz x15 min   Electrical Stimulation  Goals Edema;Pain   Vasopneumatic   Number Minutes Vasopneumatic  15 minutes   Vasopnuematic Location  Knee   Vasopneumatic Pressure Medium   Vasopneumatic Temperature  50                  PT Short Term Goals - 04/13/16 0941    PT SHORT TERM GOAL #1   Title Ind with an initial HEP.   Time 4   Period Weeks   Status Achieved   PT SHORT TERM GOAL #2   Title Achieve full left knee extension to normalize gait.   Time 4   Period Weeks   Status On-going  AROM L knee extension -12 deg from neutral 04/13/2016   PT SHORT TERM GOAL #3   Title Active left knee flexion to 90 degrees.   Time 4   Period Weeks   Status Achieved  AROM L knee flexion 94 deg 04/04/2016           PT Long Term Goals - 04/13/16 0941    PT LONG TERM GOAL #1   Title Ind with an advanced HEP.   Time 8   Period Weeks   Status On-going   PT LONG TERM GOAL #2   Title Active left knee flexion to 115 degrees+ so the  patient can perform functional tasks and do so with pain not > 2-3/10.   Time 8   Period Weeks   Status On-going  AROM L knee flexion 95 deg 04/13/2016   PT LONG TERM GOAL #3   Title Decrease edema to within 2.5 cms of non-affected side to assist with pain reduction and range of motion gains.   Time 8   Period Weeks   Status Achieved  1.4 cm difference edema with L>R 04/04/2016   PT LONG TERM GOAL #4   Title Increase left knee strength to a solid 5/5 to provide good stability for accomplishment of functional activities   Time 8   Period Weeks   Status On-going   PT LONG TERM GOAL #5   Title Perform a reciprocating stair gait with one railing with pain not > 2-3/10.   Time 8   Period Weeks   Status On-going               Plan - 04/23/16 0807    Clinical Impression Statement Patient has progressed well although slow in regards to L knee ROM. Tolerated machine knee strengthening well without reports of pain. Patient did report increased stretch of knee with leg press exercise and patient noted tightness with machine strengthening. Patient did not demonstrate hip weakness such as adduction with leg press by observation. Normal modalities response noted following removal of the modalities. Goals regarding L knee ROM, strength and stair ambulation remain on-going at this time secondary to deficits with ROM and strength. Therapeutic exercise shortened secondary to patient's conference call for work. Patient experienced only LLE tightness upon end of treatment per patient report.   Rehab Potential Good   Clinical Impairments Affecting Rehab Potential Multiple previous knee surgeries and 2 significant setbacks since surggery.   PT Frequency 3x / week   PT Duration 8 weeks   PT Treatment/Interventions ADLs/Self Care Home Management;Cryotherapy;Electrical Stimulation;Moist Heat;Therapeutic exercise;Therapeutic activities;Stair training;Gait training;Neuromuscular re-education;Patient/family  education;Manual techniques;Passive range of motion;Vasopneumatic Device   PT Next Visit Plan Continue per TKR protocol and MPT POC and continue machine strengthening next treatment.   PT Home Exercise Plan HEP- HS stretch, forward lunges and verbal education regarding  prone hangs   Consulted and Agree with Plan of Care Patient      Patient will benefit from skilled therapeutic intervention in order to improve the following deficits and impairments:  Abnormal gait, Decreased activity tolerance, Pain, Increased edema, Decreased strength, Decreased range of motion  Visit Diagnosis: Pain in left knee  Stiffness of knee joint, left  Localized edema     Problem List Patient Active Problem List   Diagnosis Date Noted  . History of pulmonary embolism 03/06/2016  . Primary osteoarthritis of left knee 03/05/2016    Wynelle Fanny, PTA 04/23/2016, 8:38 AM  [facility] [address] [place], [place], [address] Phone: [phone]   Fax:  [phone]  Name: Jeremy [name] MRN: [id number] Date of Birth: August 10, 1961

## 2016-04-25 ENCOUNTER — Ambulatory Visit: Payer: PRIVATE HEALTH INSURANCE | Admitting: Physical Therapy

## 2016-04-25 ENCOUNTER — Encounter: Payer: Self-pay | Admitting: Physical Therapy

## 2016-04-25 DIAGNOSIS — M25562 Pain in left knee: Secondary | ICD-10-CM

## 2016-04-25 DIAGNOSIS — R600 Localized edema: Secondary | ICD-10-CM

## 2016-04-25 DIAGNOSIS — M25662 Stiffness of left knee, not elsewhere classified: Secondary | ICD-10-CM

## 2016-04-25 NOTE — Therapy (Signed)
[facility] [place], [place], [address] Phone: [phone]   Fax:  [phone]  Physical Therapy Treatment  Patient Details  Name: Jeremy [name] MRN: [id number] Date of Birth: [birth year] Referring Provider: Dorna Leitz MD  Encounter Date: 04/25/2016      PT End of Session - 04/25/16 0740    Visit Number 14   Number of Visits 24   Date for PT Re-Evaluation 05/22/16   PT Start Time 0735   PT Stop Time 0822   PT Time Calculation (min) 47 min   Activity Tolerance Patient tolerated treatment well   Behavior During Therapy [facility] for tasks assessed/performed      Past Medical History  Diagnosis Date  . PE (pulmonary embolism) 1992    post op- 1 week  . Peripheral vascular disease ([place])     post op DVT-  approx. 2009, treated at [facility]   . Hypertension   . Seasonal allergies   . H/O renal calculi     has been passed spontaneously but also had cystoscopy for one   . GERD (gastroesophageal reflux disease)   . Headache     migraines on occasion - used imitrex 5- 6 yrs. ago   . Arthritis     both knees     Past Surgical History  Procedure Laterality Date  . Knee surgery      8 arthroscopies & one repair of tendon- R   . Ankle surgery Right     repair of fracture & repair of torn tendons.  . Vocal cord injection      bilateral medialization, implants on both vocal chords   . Tonsillectomy    . Total knee arthroplasty Left 03/05/2016    Procedure: LEFT TOTAL KNEE ARTHROPLASTY;  Surgeon: Dorna Leitz, MD;  Location: [place];  Service: Orthopedics;  Laterality: Left;    There were no vitals filed for this visit.      Subjective Assessment - 04/25/16 0740    Subjective Reports his knee isn't as swollen but still feels tight.   Limitations Walking   How long can you walk comfortably? Short distances.   Patient Stated Goals Get back to an active lifestyle.   Currently in Pain? Yes   Pain Score 3    Pain Location  Knee   Pain Orientation Left   Pain Descriptors / Indicators Tightness   Pain Type Surgical pain            [facility] PT Assessment - 04/25/16 0001    Assessment   Medical Diagnosis Left total knee replacement   Onset Date/Surgical Date 03/05/16   Next MD Visit 05/08/2016   Restrictions   Weight Bearing Restrictions No   AROM   Left Knee Extension -9   Left Knee Flexion 108                     [facility] Adult PT Treatment/Exercise - 04/25/16 0001    Knee/Hip Exercises: Aerobic   Nustep L5, seat 10 x10 min   Knee/Hip Exercises: Machines for Strengthening   Cybex Knee Extension 10# 3x10 reps   Cybex Knee Flexion 30# 3x10 reps   Cybex Leg Press 2pl, seat 6, 3x10 reps   Knee/Hip Exercises: Standing   Forward Lunges Left;2 sets;10 reps;3 seconds;Other (comment)  8" step   Modalities   Modalities Vasopneumatic;Electrical Stimulation   Electrical Stimulation   Electrical Stimulation Location L knee   Electrical Stimulation Action IFC   Electrical  Stimulation Parameters 1-10 Hz x15 min   Electrical Stimulation Goals Edema;Pain   Vasopneumatic   Number Minutes Vasopneumatic  15 minutes   Vasopnuematic Location  Knee   Vasopneumatic Pressure Medium   Vasopneumatic Temperature  34   Manual Therapy   Manual Therapy Passive ROM   Passive ROM PROM of L knee into ext/flex with  holds at end range                   PT Short Term Goals - 04/25/16 0844    PT SHORT TERM GOAL #1   Title Ind with an initial HEP.   Time 4   Period Weeks   Status Achieved   PT SHORT TERM GOAL #2   Title Achieve full left knee extension to normalize gait.   Time 4   Period Weeks   Status On-going  AROM L knee extension -9 deg from neutral 04/25/2016   PT SHORT TERM GOAL #3   Title Active left knee flexion to 90 degrees.   Time 4   Period Weeks   Status Achieved  AROM L knee flexion 94 deg 04/04/2016           PT Long Term Goals - 04/25/16 0844    PT LONG TERM GOAL #1    Title Ind with an advanced HEP.   Time 8   Period Weeks   Status On-going   PT LONG TERM GOAL #2   Title Active left knee flexion to 115 degrees+ so the patient can perform functional tasks and do so with pain not > 2-3/10.   Time 8   Period Weeks   Status On-going  AROM L knee flexion 108 deg 04/25/2016   PT LONG TERM GOAL #3   Title Decrease edema to within 2.5 cms of non-affected side to assist with pain reduction and range of motion gains.   Time 8   Period Weeks   Status Achieved  1.4 cm difference edema with L>R 04/04/2016   PT LONG TERM GOAL #4   Title Increase left knee strength to a solid 5/5 to provide good stability for accomplishment of functional activities   Time 8   Period Weeks   Status On-going   PT LONG TERM GOAL #5   Title Perform a reciprocating stair gait with one railing with pain not > 2-3/10.   Time 8   Period Weeks   Status On-going               Plan - 04/25/16 0809    Clinical Impression Statement Patient continues to progress slowly regarding ROM of L knee and has tolerated machine knee strengthening well. Patient completes exercises well with minimal multimodal cueing for exercise technique without report of increased pain. Patient continues to present in clinic with increased L medial knee pain. Patient presented with increased L medial HS tightness upon palpation. AROM of L knee measured as 9-108 deg in supine. Normal modalities response noted following removal of the modalities. Emphasized to patient to continue HS stretch at home to improve L knee extension.    Rehab Potential Good   Clinical Impairments Affecting Rehab Potential Multiple previous knee surgeries and 2 significant setbacks since surggery.   PT Frequency 3x / week   PT Duration 8 weeks   PT Treatment/Interventions ADLs/Self Care Home Management;Cryotherapy;Electrical Stimulation;Moist Heat;Therapeutic exercise;Therapeutic activities;Stair training;Gait training;Neuromuscular  re-education;Patient/family education;Manual techniques;Passive range of motion;Vasopneumatic Device   PT Next Visit Plan Continue per TKR protocol and MPT POC  and continue machine strengthening next treatment.   PT Home Exercise Plan HEP- HS stretch, forward lunges and verbal education regarding prone hangs   Consulted and Agree with Plan of Care Patient      Patient will benefit from skilled therapeutic intervention in order to improve the following deficits and impairments:  Abnormal gait, Decreased activity tolerance, Pain, Increased edema, Decreased strength, Decreased range of motion  Visit Diagnosis: Pain in left knee  Stiffness of knee joint, left  Localized edema     Problem List Patient Active Problem List   Diagnosis Date Noted  . History of pulmonary embolism 03/06/2016  . Primary osteoarthritis of left knee 03/05/2016    Wynelle Fanny, PTA 04/25/2016, 8:47 AM  [facility] [place], [place], [address] Phone: [phone]   Fax:  [phone]  Name: Jeremy [name] MRN: [id number] Date of Birth: Apr 29, 1961

## 2016-04-27 ENCOUNTER — Encounter: Payer: Self-pay | Admitting: Physical Therapy

## 2016-04-27 ENCOUNTER — Ambulatory Visit: Payer: PRIVATE HEALTH INSURANCE | Admitting: Physical Therapy

## 2016-04-27 DIAGNOSIS — R600 Localized edema: Secondary | ICD-10-CM

## 2016-04-27 DIAGNOSIS — M25662 Stiffness of left knee, not elsewhere classified: Secondary | ICD-10-CM

## 2016-04-27 DIAGNOSIS — M25562 Pain in left knee: Secondary | ICD-10-CM

## 2016-04-27 NOTE — Therapy (Signed)
[facility] [place], [place], [address] Phone: [phone]   Fax:  [phone]  Physical Therapy Treatment  Patient Details  Name: Jeremy [name] MRN: [id number] Date of Birth: July 05, 1961 Referring Provider: Dorna Leitz MD  Encounter Date: 04/27/2016      PT End of Session - 04/27/16 0736    Visit Number 15   Number of Visits 24   Date for PT Re-Evaluation 05/22/16   PT Start Time 0736   PT Stop Time 0826   PT Time Calculation (min) 50 min   Activity Tolerance Patient tolerated treatment well   Behavior During Therapy [facility] for tasks assessed/performed      Past Medical History  Diagnosis Date  . PE (pulmonary embolism) 1992    post op- 1 week  . Peripheral vascular disease ([place])     post op DVT-  approx. 2009, treated at [facility]   . Hypertension   . Seasonal allergies   . H/O renal calculi     has been passed spontaneously but also had cystoscopy for one   . GERD (gastroesophageal reflux disease)   . Headache     migraines on occasion - used imitrex 5- 6 yrs. ago   . Arthritis     both knees     Past Surgical History  Procedure Laterality Date  . Knee surgery      8 arthroscopies & one repair of tendon- R   . Ankle surgery Right     repair of fracture & repair of torn tendons.  . Vocal cord injection      bilateral medialization, implants on both vocal chords   . Tonsillectomy    . 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      Subjective Assessment - 04/27/16 0735    Subjective Reports lateral L knee pain with stepping down steps on LLE.   Limitations Walking   How long can you walk comfortably? Short distances.   Patient Stated Goals Get back to an active lifestyle.   Currently in Pain? Yes   Pain Score 3    Pain Location  Knee   Pain Orientation Left   Pain Type Surgical pain            [facility] PT Assessment - 04/27/16 0001    Assessment   Medical Diagnosis Left total knee replacement   Onset Date/Surgical Date 03/05/16   Next MD Visit 05/08/2016   Restrictions   Weight Bearing Restrictions No                     [facility] Adult PT Treatment/Exercise - 04/27/16 0001    Ambulation/Gait   Stairs Yes   Stairs Assistance 7: Independent   Stair Management Technique Alternating pattern;Forwards   Number of Stairs 16   Height of Stairs 6   Knee/Hip Exercises: Aerobic   Nustep L4, seat 10 x10 min   Knee/Hip Exercises: Machines for Strengthening   Cybex Knee Extension 20# 3x10 reps   Cybex Knee Flexion 40# 3x10 reps   Cybex Leg Press 3 pl, seat 5, 3x10 reps   Knee/Hip Exercises: Standing   Forward Lunges Left;2 sets;10 reps;3 seconds;Other (comment)  8" step   Modalities   Modalities Vasopneumatic;Electrical Stimulation   Electrical Stimulation   Electrical Stimulation  Location L knee   Electrical Stimulation Action IFC   Electrical Stimulation Parameters 1-10 hz x15 min   Electrical Stimulation Goals Edema;Pain   Vasopneumatic   Number Minutes Vasopneumatic  15 minutes   Vasopnuematic Location  Knee   Vasopneumatic Pressure Medium   Vasopneumatic Temperature  34   Manual Therapy   Manual Therapy Myofascial release   Myofascial Release IASTW to L distal ITB, HS to decrease tightness and adhesions                  PT Short Term Goals - 04/25/16 0844    PT SHORT TERM GOAL #1   Title Ind with an initial HEP.   Time 4   Period Weeks   Status Achieved   PT SHORT TERM GOAL #2   Title Achieve full left knee extension to normalize gait.   Time 4   Period Weeks   Status On-going  AROM L knee extension -9 deg from neutral 04/25/2016   PT SHORT TERM GOAL #3   Title Active left knee flexion to 90 degrees.   Time 4   Period Weeks   Status Achieved  AROM L knee flexion 94  deg 04/04/2016           PT Long Term Goals - 04/27/16 0749    PT LONG TERM GOAL #1   Title Ind with an advanced HEP.   Time 8   Period Weeks   Status On-going   PT LONG TERM GOAL #2   Title Active left knee flexion to 115 degrees+ so the patient can perform functional tasks and do so with pain not > 2-3/10.   Time 8   Period Weeks   Status On-going  AROM L knee flexion 108 deg 04/25/2016   PT LONG TERM GOAL #3   Title Decrease edema to within 2.5 cms of non-affected side to assist with pain reduction and range of motion gains.   Time 8   Period Weeks   Status Achieved  1.4 cm difference edema with L>R 04/04/2016   PT LONG TERM GOAL #4   Title Increase left knee strength to a solid 5/5 to provide good stability for accomplishment of functional activities   Time 8   Period Weeks   Status On-going   PT LONG TERM GOAL #5   Title Perform a reciprocating stair gait with one railing with pain not > 2-3/10.   Time 8   Period Weeks   Status Partially Met  Able to ambulate stair reciprically but has sharp lateral knee pain per patient report 04/27/2016               Plan - 04/27/16 0811    Clinical Impression Statement Patient continues to progress with machine strengthening with an increase in resistance. Patient able to ambulate stairs reciprically without use of hand rail although reports of sharp pain located in lateral L knee upon LLE weightbearing. IASTW to L distal ITB and lateral aspects of lateral L HS to decrease any adhesions that are present that may be affecting stair ambulation. Petiche developed over the area of the lateral fibers of the lateral HS. Patient presented iwth increased L ITB and lateral HS tightness upon palpation. Normal modalites response noted folowing removal of the modalities. Reports intermittant sharp pain in L lateral knee intermittantly with ambulation.   Rehab Potential Good   Clinical Impairments Affecting Rehab Potential Multiple previous knee  surgeries and 2 significant setbacks since surggery.   PT  Frequency 3x / week   PT Duration 8 weeks   PT Treatment/Interventions ADLs/Self Care Home Management;Cryotherapy;Electrical Stimulation;Moist Heat;Therapeutic exercise;Therapeutic activities;Stair training;Gait training;Neuromuscular re-education;Patient/family education;Manual techniques;Passive range of motion;Vasopneumatic Device   PT Next Visit Plan Continue per TKR protocol and MPT POC and continue machine strengthening next treatment.   PT Home Exercise Plan HEP- HS stretch, forward lunges and verbal education regarding prone hangs   Consulted and Agree with Plan of Care Patient      Patient will benefit from skilled therapeutic intervention in order to improve the following deficits and impairments:  Abnormal gait, Decreased activity tolerance, Pain, Increased edema, Decreased strength, Decreased range of motion  Visit Diagnosis: Pain in left knee  Stiffness of knee joint, left  Localized edema     Problem List Patient Active Problem List   Diagnosis Date Noted  . History of pulmonary embolism 03/06/2016  . Primary osteoarthritis of left knee 03/05/2016    Wynelle Fanny, PTA 04/27/2016, 8:30 AM  [facility] [place], [place], [address] Phone: [phone]   Fax:  [phone]  Name: THONG FEENY MRN: [id number] Date of Birth: November 11, 1961

## 2016-05-02 ENCOUNTER — Encounter: Payer: Self-pay | Admitting: Physical Therapy

## 2016-05-02 ENCOUNTER — Ambulatory Visit: Payer: PRIVATE HEALTH INSURANCE | Admitting: Physical Therapy

## 2016-05-02 DIAGNOSIS — R600 Localized edema: Secondary | ICD-10-CM

## 2016-05-02 DIAGNOSIS — M25562 Pain in left knee: Secondary | ICD-10-CM | POA: Diagnosis not present

## 2016-05-02 DIAGNOSIS — M25662 Stiffness of left knee, not elsewhere classified: Secondary | ICD-10-CM

## 2016-05-02 NOTE — Therapy (Signed)
[facility] [place], [place], [address] Phone: [phone]   Fax:  [phone]  Physical Therapy Treatment  Patient Details  Name: Jeremy [name] MRN: [id number] Date of Birth: [birth year] Referring Provider: Dorna Leitz MD  Encounter Date: 05/02/2016      PT End of Session - 05/02/16 0734    Visit Number 16   Number of Visits 24   Date for PT Re-Evaluation 05/22/16   PT Start Time 0734   PT Stop Time 0820   PT Time Calculation (min) 46 min   Activity Tolerance Patient tolerated treatment well   Behavior During Therapy [facility] for tasks assessed/performed      Past Medical History  Diagnosis Date  . PE (pulmonary embolism) 1992    post op- 1 week  . Peripheral vascular disease ([place])     post op DVT-  approx. 2009, treated at [facility]   . Hypertension   . Seasonal allergies   . H/O renal calculi     has been passed spontaneously but also had cystoscopy for one   . GERD (gastroesophageal reflux disease)   . Headache     migraines on occasion - used imitrex 5- 6 yrs. ago   . Arthritis     both knees     Past Surgical History  Procedure Laterality Date  . Knee surgery      8 arthroscopies & one repair of tendon- R   . Ankle surgery Right     repair of fracture & repair of torn tendons.  . Vocal cord injection      bilateral medialization, implants on both vocal chords   . Tonsillectomy    . Total knee arthroplasty Left 03/05/2016    Procedure: LEFT TOTAL KNEE ARTHROPLASTY;  Surgeon: Dorna Leitz, MD;  Location: [place];  Service: Orthopedics;  Laterality: Left;    There were no vitals filed for this visit.      Subjective Assessment - 05/02/16 0734    Subjective Reports that he had an area of inflammation around lateral L knee but was on his feet a lot over the weekend. Reports that the other day his knee straightened out a lot easier for him.   Limitations Walking   How long can you walk comfortably? Short  distances.   Patient Stated Goals Get back to an active lifestyle.   Currently in Pain? No/denies            [facility] PT Assessment - 05/02/16 0001    Assessment   Medical Diagnosis Left total knee replacement   Onset Date/Surgical Date 03/05/16   Next MD Visit 05/08/2016   Restrictions   Weight Bearing Restrictions No   ROM / Strength   AROM / PROM / Strength AROM   AROM   Overall AROM  Deficits   AROM Assessment Site Knee   Right/Left Knee Left   Left Knee Extension -5   Left Knee Flexion 113                     [facility] Adult PT Treatment/Exercise - 05/02/16 0001    Knee/Hip Exercises: Aerobic   Nustep L5, seat 10 x10 min   Knee/Hip Exercises: Machines for Strengthening   Cybex Knee Extension 20# 3x10 reps   Cybex Knee Flexion 40# 3x10 reps   Cybex Leg Press 3 pl, seat 5, 3x10 reps   Knee/Hip Exercises: Standing   Forward Lunges Left;2 sets;10 reps;3 seconds;Other (comment)  8" step  Lateral Step Up Left;3 sets;10 reps;Hand Hold: 2;Step Height: 8"   Forward Step Up Left;3 sets;10 reps;Hand Hold: 2;Step Height: 8"   Rocker Board 3 minutes   Modalities   Modalities Vasopneumatic;Electrical Stimulation   Electrical Stimulation   Electrical Stimulation Location L knee   Electrical Stimulation Action IFC   Electrical Stimulation Parameters 1-10 Hz x15 min   Electrical Stimulation Goals Edema;Pain   Vasopneumatic   Number Minutes Vasopneumatic  15 minutes   Vasopnuematic Location  Knee   Vasopneumatic Pressure Medium   Vasopneumatic Temperature  34                  PT Short Term Goals - 05/02/16 7614    PT SHORT TERM GOAL #1   Title Ind with an initial HEP.   Time 4   Period Weeks   Status Achieved   PT SHORT TERM GOAL #2   Title Achieve full left knee extension to normalize gait.   Time 4   Period Weeks   Status On-going  AROM L knee extension -5 deg from neutral 05/02/2016   PT SHORT TERM GOAL #3   Title Active left knee flexion to 90  degrees.   Time 4   Period Weeks   Status Achieved  AROM L knee flexion 94 deg 04/04/2016           PT Long Term Goals - 05/02/16 0807    PT LONG TERM GOAL #1   Title Ind with an advanced HEP.   Time 8   Period Weeks   Status On-going   PT LONG TERM GOAL #2   Title Active left knee flexion to 115 degrees+ so the patient can perform functional tasks and do so with pain not > 2-3/10.   Time 8   Period Weeks   Status On-going  AROM L knee flexion 113 deg 05/02/2016   PT LONG TERM GOAL #3   Title Decrease edema to within 2.5 cms of non-affected side to assist with pain reduction and range of motion gains.   Time 8   Period Weeks   Status Achieved  1.4 cm difference edema with L>R 04/04/2016   PT LONG TERM GOAL #4   Title Increase left knee strength to a solid 5/5 to provide good stability for accomplishment of functional activities   Time 8   Period Weeks   Status On-going   PT LONG TERM GOAL #5   Title Perform a reciprocating stair gait with one railing with pain not > 2-3/10.   Time 8   Period Weeks   Status Partially Met  Able to ambulate stair reciprically but has sharp lateral knee pain per patient report 04/27/2016               Plan - 05/02/16 0808    Clinical Impression Statement Patient continue to progress with L knee ROM and strengthening at this time. Patient able to complete all exercises with very little VCs for exercise technique and without reports of pain. No abnormal edema noted today upon observation of L knee as patient reported increased lateral knee edema following activity over the weekend. AROM of L knee measured as 5-113 deg today in supine. Patient noted that once the edema in L knee was eliminated that he would probably be able to do more. Normal modalities response noted following removal of the modalities.   Rehab Potential Good   Clinical Impairments Affecting Rehab Potential Multiple previous knee surgeries and 2 significant setbacks  since  surggery.   PT Frequency 3x / week   PT Duration 8 weeks   PT Treatment/Interventions ADLs/Self Care Home Management;Cryotherapy;Electrical Stimulation;Moist Heat;Therapeutic exercise;Therapeutic activities;Stair training;Gait training;Neuromuscular re-education;Patient/family education;Manual techniques;Passive range of motion;Vasopneumatic Device   PT Next Visit Plan Continue per TKR protocol and MPT POC and continue machine strengthening next treatment.   PT Home Exercise Plan HEP- HS stretch, forward lunges and verbal education regarding prone hangs   Consulted and Agree with Plan of Care Patient      Patient will benefit from skilled therapeutic intervention in order to improve the following deficits and impairments:  Abnormal gait, Decreased activity tolerance, Pain, Increased edema, Decreased strength, Decreased range of motion  Visit Diagnosis: Pain in left knee  Stiffness of knee joint, left  Localized edema     Problem List Patient Active Problem List   Diagnosis Date Noted  . History of pulmonary embolism 03/06/2016  . Primary osteoarthritis of left knee 03/05/2016    Wynelle Fanny, PTA 05/02/2016, 8:22 AM  [facility] [address] [place], [place], [address] Phone: [phone]   Fax:  [phone]  Name: DEWANE TIMSON MRN: [id number] Date of Birth: [birth year]

## 2016-05-04 ENCOUNTER — Ambulatory Visit: Payer: PRIVATE HEALTH INSURANCE | Attending: Orthopedic Surgery | Admitting: Physical Therapy

## 2016-05-04 ENCOUNTER — Encounter: Payer: Self-pay | Admitting: Physical Therapy

## 2016-05-04 DIAGNOSIS — M25662 Stiffness of left knee, not elsewhere classified: Secondary | ICD-10-CM | POA: Diagnosis present

## 2016-05-04 DIAGNOSIS — M25562 Pain in left knee: Secondary | ICD-10-CM | POA: Insufficient documentation

## 2016-05-04 DIAGNOSIS — R600 Localized edema: Secondary | ICD-10-CM

## 2016-05-04 NOTE — Therapy (Signed)
[facility] [place], [place], [address] Phone: [phone]   Fax:  [phone]  Physical Therapy Treatment  Patient Details  Name: Jeremy [name] MRN: [id number] Date of Birth: 13-Dec-1960 Referring Provider: Dorna Leitz MD  Encounter Date: 05/04/2016      PT End of Session - 05/04/16 0729    Visit Number 17   Number of Visits 24   Date for PT Re-Evaluation 05/22/16   PT Start Time 0730   PT Stop Time 0814   PT Time Calculation (min) 44 min   Activity Tolerance Patient tolerated treatment well   Behavior During Therapy [facility] for tasks assessed/performed      Past Medical History  Diagnosis Date  . PE (pulmonary embolism) 1992    post op- 1 week  . Peripheral vascular disease ([place])     post op DVT-  approx. 2009, treated at [facility]   . Hypertension   . Seasonal allergies   . H/O renal calculi     has been passed spontaneously but also had cystoscopy for one   . GERD (gastroesophageal reflux disease)   . Headache     migraines on occasion - used imitrex 5- 6 yrs. ago   . Arthritis     both knees     Past Surgical History  Procedure Laterality Date  . Knee surgery      8 arthroscopies & one repair of tendon- R   . Ankle surgery Right     repair of fracture & repair of torn tendons.  . Vocal cord injection      bilateral medialization, implants on both vocal chords   . Tonsillectomy    . Total knee arthroplasty Left 03/05/2016    Procedure: LEFT TOTAL KNEE ARTHROPLASTY;  Surgeon: Dorna Leitz, MD;  Location: [place];  Service: Orthopedics;  Laterality: Left;    There were no vitals filed for this visit.      Subjective Assessment - 05/04/16 0729    Subjective Reports that his knee is okay but his ankle swelled up so much last night that he had to take his shoe off.   Limitations Walking   How long can you walk comfortably? Short distances.   Patient Stated Goals Get back to an active lifestyle.   Currently  in Pain? Yes   Pain Score 3    Pain Location Knee   Pain Orientation Left   Pain Descriptors / Indicators Sore   Pain Type Surgical pain            [facility] PT Assessment - 05/04/16 0001    Assessment   Medical Diagnosis Left total knee replacement   Onset Date/Surgical Date 03/05/16   Next MD Visit 05/08/2016   Restrictions   Weight Bearing Restrictions No                     [facility] Adult PT Treatment/Exercise - 05/04/16 0001    Knee/Hip Exercises: Aerobic   Stationary Bike Seat 7 x15 min   Knee/Hip Exercises: Machines for Strengthening   Cybex Knee Extension 20# 3x10 reps   Cybex Knee Flexion 40# 3x10 reps   Cybex Leg Press 3 pl, seat 5, 3x10 reps   Knee/Hip Exercises: Standing   Forward Lunges Left;2 sets;10 reps;3 seconds;Other (comment)  6" step   Step Down Left;3 sets;10 reps;Hand Hold: 2;Step Height: 4"   Modalities   Modalities Vasopneumatic;Electrical Stimulation   Electrical Stimulation   Electrical Stimulation Location L  knee   Electrical Stimulation Action IFC   Electrical Stimulation Parameters 1-10 Hz x15 min   Electrical Stimulation Goals Edema;Pain   Vasopneumatic   Number Minutes Vasopneumatic  15 minutes   Vasopnuematic Location  Knee   Vasopneumatic Pressure Medium   Vasopneumatic Temperature  34                  PT Short Term Goals - 05/02/16 0354    PT SHORT TERM GOAL #1   Title Ind with an initial HEP.   Time 4   Period Weeks   Status Achieved   PT SHORT TERM GOAL #2   Title Achieve full left knee extension to normalize gait.   Time 4   Period Weeks   Status On-going  AROM L knee extension -5 deg from neutral 05/02/2016   PT SHORT TERM GOAL #3   Title Active left knee flexion to 90 degrees.   Time 4   Period Weeks   Status Achieved  AROM L knee flexion 94 deg 04/04/2016           PT Long Term Goals - 05/02/16 0807    PT LONG TERM GOAL #1   Title Ind with an advanced HEP.   Time 8   Period Weeks   Status  On-going   PT LONG TERM GOAL #2   Title Active left knee flexion to 115 degrees+ so the patient can perform functional tasks and do so with pain not > 2-3/10.   Time 8   Period Weeks   Status On-going  AROM L knee flexion 113 deg 05/02/2016   PT LONG TERM GOAL #3   Title Decrease edema to within 2.5 cms of non-affected side to assist with pain reduction and range of motion gains.   Time 8   Period Weeks   Status Achieved  1.4 cm difference edema with L>R 04/04/2016   PT LONG TERM GOAL #4   Title Increase left knee strength to a solid 5/5 to provide good stability for accomplishment of functional activities   Time 8   Period Weeks   Status On-going   PT LONG TERM GOAL #5   Title Perform a reciprocating stair gait with one railing with pain not > 2-3/10.   Time 8   Period Weeks   Status Partially Met  Able to ambulate stair reciprically but has sharp lateral knee pain per patient report 04/27/2016               Plan - 05/04/16 0800    Clinical Impression Statement Patient arrived to treatment with reports of increased L ankle edema that presented last night and minimal increased swelling continues to be noted in L ankle upon observation this morning. Edema continues to be noted around L knee as well. Patient able to tolerate bikefairly well although he had to move seat backwards to loosen knee. Patient continues to complete therapeutic strengthening well with no reports of pain or discomfort. Normal modalities response noted following removal of the modalities.     Rehab Potential Good   Clinical Impairments Affecting Rehab Potential Multiple previous knee surgeries and 2 significant setbacks since surggery.   PT Frequency 3x / week   PT Duration 8 weeks   PT Treatment/Interventions ADLs/Self Care Home Management;Cryotherapy;Electrical Stimulation;Moist Heat;Therapeutic exercise;Therapeutic activities;Stair training;Gait training;Neuromuscular re-education;Patient/family  education;Manual techniques;Passive range of motion;Vasopneumatic Device   PT Next Visit Plan Continue per TKR protocol and MPT POC and continue machine strengthening next treatment. MD note  required next treatment.   PT Home Exercise Plan HEP- HS stretch, forward lunges and verbal education regarding prone hangs   Consulted and Agree with Plan of Care Patient      Patient will benefit from skilled therapeutic intervention in order to improve the following deficits and impairments:  Abnormal gait, Decreased activity tolerance, Pain, Increased edema, Decreased strength, Decreased range of motion  Visit Diagnosis: Pain in left knee  Stiffness of knee joint, left  Localized edema     Problem List Patient Active Problem List   Diagnosis Date Noted  . History of pulmonary embolism 03/06/2016  . Primary osteoarthritis of left knee 03/05/2016    Wynelle Fanny, PTA 05/04/2016, 8:17 AM  [facility] [place], [place], [address] Phone: [phone]   Fax:  [phone]  Name: Jeremy [name] MRN: [id number] Date of Birth: 1961/04/09

## 2016-05-07 ENCOUNTER — Ambulatory Visit: Payer: PRIVATE HEALTH INSURANCE | Admitting: Physical Therapy

## 2016-05-07 ENCOUNTER — Encounter: Payer: Self-pay | Admitting: Physical Therapy

## 2016-05-07 DIAGNOSIS — R600 Localized edema: Secondary | ICD-10-CM

## 2016-05-07 DIAGNOSIS — M25662 Stiffness of left knee, not elsewhere classified: Secondary | ICD-10-CM

## 2016-05-07 DIAGNOSIS — M25562 Pain in left knee: Secondary | ICD-10-CM | POA: Diagnosis not present

## 2016-05-07 NOTE — Therapy (Signed)
[facility] [place], [place], [address] Phone: [phone]   Fax:  [phone]  Physical Therapy Treatment  Patient Details  Name: Jeremy [name] MRN: [id number] Date of Birth: 12/04/1960 Referring Provider: Dorna Leitz MD  Encounter Date: 05/07/2016      PT End of Session - 05/07/16 0747    Visit Number 18   Number of Visits 24   Date for PT Re-Evaluation 05/22/16   PT Start Time 0736   PT Stop Time 0821   PT Time Calculation (min) 45 min   Activity Tolerance Patient tolerated treatment well   Behavior During Therapy [facility] for tasks assessed/performed      Past Medical History  Diagnosis Date  . PE (pulmonary embolism) 1992    post op- 1 week  . Peripheral vascular disease ([place])     post op DVT-  approx. 2009, treated at [facility]   . Hypertension   . Seasonal allergies   . H/O renal calculi     has been passed spontaneously but also had cystoscopy for one   . GERD (gastroesophageal reflux disease)   . Headache     migraines on occasion - used imitrex 5- 6 yrs. ago   . Arthritis     both knees     Past Surgical History  Procedure Laterality Date  . Knee surgery      8 arthroscopies & one repair of tendon- R   . Ankle surgery Right     repair of fracture & repair of torn tendons.  . Vocal cord injection      bilateral medialization, implants on both vocal chords   . Tonsillectomy    . Total knee arthroplasty Left 03/05/2016    Procedure: LEFT TOTAL KNEE ARTHROPLASTY;  Surgeon: Dorna Leitz, MD;  Location: [facility];  Service: Orthopedics;  Laterality: Left;    There were no vitals filed for this visit.      Subjective Assessment - 05/07/16 0746    Subjective Reports that he takes his time with stairs now as he continues to have intermittant sharp pain in inferior region on bilateral sides of L knee.   Limitations Walking   How long can you walk comfortably? Short distances.   Patient Stated Goals Get back  to an active lifestyle.   Currently in Pain? Yes   Pain Score --  Gave no numerical pain rating   Pain Location Knee   Pain Orientation Left;Distal;Medial;Lateral   Pain Type Surgical pain            [facility] PT Assessment - 05/07/16 0001    Assessment   Medical Diagnosis Left total knee replacement   Onset Date/Surgical Date 03/05/16   Next MD Visit 05/08/2016   Restrictions   Weight Bearing Restrictions No   ROM / Strength   AROM / PROM / Strength AROM;Strength   AROM   Overall AROM  Deficits;Within functional limits for tasks performed   AROM Assessment Site Knee   Right/Left Knee Left   Left Knee Extension -5   Left Knee Flexion 115   Strength   Overall Strength Within functional limits for tasks performed   Strength Assessment Site Hip;Knee   Right/Left Hip Left   Left Hip Flexion 5/5   Left Hip ABduction 5/5   Right/Left Knee Left   Left Knee Flexion 5/5   Left Knee Extension 5/5  [facility] Adult PT Treatment/Exercise - 05/07/16 0001    Knee/Hip Exercises: Aerobic   Stationary Bike Seat 7 x12 min   Knee/Hip Exercises: Machines for Strengthening   Cybex Knee Extension 20# 3x10 reps   Cybex Knee Flexion 40# 3x10 reps   Cybex Leg Press 3 pl, seat 5, 3x10 reps   Modalities   Modalities Vasopneumatic;Electrical Stimulation   Electrical Stimulation   Electrical Stimulation Location L knee   Electrical Stimulation Action IFC   Electrical Stimulation Parameters 1-10 hz x15 min   Electrical Stimulation Goals Edema;Pain   Vasopneumatic   Number Minutes Vasopneumatic  15 minutes   Vasopnuematic Location  Knee   Vasopneumatic Pressure Medium   Vasopneumatic Temperature  34   Manual Therapy   Manual Therapy Soft tissue mobilization   Soft tissue mobilization L patellar mobilizations in sup/inf, L/R to decrease adhesions that may be present                  PT Short Term Goals - 05/07/16 0813    PT SHORT TERM GOAL #1   Title  Ind with an initial HEP.   Time 4   Period Weeks   Status Achieved   PT SHORT TERM GOAL #2   Title Achieve full left knee extension to normalize gait.   Time 4   Period Weeks   Status On-going  AROM L knee extension -5 deg from neutral 05/07/2016   PT SHORT TERM GOAL #3   Title Active left knee flexion to 90 degrees.   Time 4   Period Weeks   Status Achieved  AROM L knee flexion 94 deg 04/04/2016           PT Long Term Goals - 05/07/16 0814    PT LONG TERM GOAL #1   Title Ind with an advanced HEP.   Time 8   Period Weeks   Status On-going   PT LONG TERM GOAL #2   Title Active left knee flexion to 115 degrees+ so the patient can perform functional tasks and do so with pain not > 2-3/10.   Time 8   Period Weeks   Status On-going  AROM L knee flexion 115 deg 05/07/2016   PT LONG TERM GOAL #3   Title Decrease edema to within 2.5 cms of non-affected side to assist with pain reduction and range of motion gains.   Time 8   Period Weeks   Status Achieved  1.4 cm difference edema with L>R 04/04/2016   PT LONG TERM GOAL #4   Title Increase left knee strength to a solid 5/5 to provide good stability for accomplishment of functional activities   Time 8   Period Weeks   Status Achieved  L hip flex/abduct and L knee flex/ext MMT 5/5 05/07/2016   PT LONG TERM GOAL #5   Title Perform a reciprocating stair gait with one railing with pain not > 2-3/10.   Time 8   Period Weeks   Status Partially Met  Able to ambulate stair reciprically but has sharp lateral knee pain per patient report 04/27/2016               Plan - 05/07/16 0820    Clinical Impression Statement Patient continues to improve following L TKR 9 weeks ago on 03/05/2016. Patient able to complete machine knee strengthening while in clinic without complaint and able to advance per his tolerance. AROM of L knee measured as 5-115 deg in supine. L hip/knee MMT measured as  5/5 in measured muscle groups today. Patient able to  ambulate without use of AD for some time now although he ambulates with lack of L knee flexion. Patient ambulates stairs with caution and slowly as he reports experiences inferior knee sharp pains along both sides of incision. Continues to demonstrate good L patellar mobility in both directions assessed today. Normal modalities response noted following removal of the modaliites.   Rehab Potential Good   Clinical Impairments Affecting Rehab Potential Multiple previous knee surgeries and 2 significant setbacks since surggery.   PT Frequency 3x / week   PT Duration 8 weeks   PT Treatment/Interventions ADLs/Self Care Home Management;Cryotherapy;Electrical Stimulation;Moist Heat;Therapeutic exercise;Therapeutic activities;Stair training;Gait training;Neuromuscular re-education;Patient/family education;Manual techniques;Passive range of motion;Vasopneumatic Device   PT Next Visit Plan Continue per TKR protocol and MPT POC and continue machine strengthening next treatment.    PT Home Exercise Plan HEP- HS stretch, forward lunges and verbal education regarding prone hangs   Consulted and Agree with Plan of Care Patient      Patient will benefit from skilled therapeutic intervention in order to improve the following deficits and impairments:  Abnormal gait, Decreased activity tolerance, Pain, Increased edema, Decreased strength, Decreased range of motion  Visit Diagnosis: Pain in left knee  Stiffness of knee joint, left  Localized edema     Problem List Patient Active Problem List   Diagnosis Date Noted  . History of pulmonary embolism 03/06/2016  . Primary osteoarthritis of left knee 03/05/2016    Ahmed Prima, PTA 05/07/2016 1:13 PM Mali Applegate MPT [facility] [place], [place], [address] Phone: [phone]   Fax:  [phone]  Name: Jeremy [name] MRN: [id number] Date of Birth: [birth year]

## 2016-05-09 ENCOUNTER — Ambulatory Visit: Payer: PRIVATE HEALTH INSURANCE | Admitting: Physical Therapy

## 2016-05-09 ENCOUNTER — Encounter: Payer: Self-pay | Admitting: Physical Therapy

## 2016-05-09 DIAGNOSIS — M25562 Pain in left knee: Secondary | ICD-10-CM

## 2016-05-09 DIAGNOSIS — M25662 Stiffness of left knee, not elsewhere classified: Secondary | ICD-10-CM

## 2016-05-09 DIAGNOSIS — R600 Localized edema: Secondary | ICD-10-CM

## 2016-05-09 NOTE — Therapy (Signed)
[facility] [place], [place], [address] Phone: [phone]   Fax:  [phone]  Physical Therapy Treatment  Patient Details  Name: Jeremy [name] MRN: [id number] Date of Birth: [birth year] Referring Provider: Dorna Leitz MD  Encounter Date: 05/09/2016      PT End of Session - 05/09/16 0742    Visit Number 19   Number of Visits 32   Date for PT Re-Evaluation 06/19/16   PT Start Time 0739   PT Stop Time 0824   PT Time Calculation (min) 45 min   Activity Tolerance Patient tolerated treatment well   Behavior During Therapy [facility] for tasks assessed/performed      Past Medical History  Diagnosis Date  . PE (pulmonary embolism) 1992    post op- 1 week  . Peripheral vascular disease ([place])     post op DVT-  approx. 2009, treated at [facility]   . Hypertension   . Seasonal allergies   . H/O renal calculi     has been passed spontaneously but also had cystoscopy for one   . GERD (gastroesophageal reflux disease)   . Headache     migraines on occasion - used imitrex 5- 6 yrs. ago   . Arthritis     both knees     Past Surgical History  Procedure Laterality Date  . Knee surgery      8 arthroscopies & one repair of tendon- R   . Ankle surgery Right     repair of fracture & repair of torn tendons.  . Vocal cord injection      bilateral medialization, implants on both vocal chords   . Tonsillectomy    . Total knee arthroplasty Left 03/05/2016    Procedure: LEFT TOTAL KNEE ARTHROPLASTY;  Surgeon: Dorna Leitz, MD;  Location: [place] [facility];  Service: Orthopedics;  Laterality: Left;    There were no vitals filed for this visit.      Subjective Assessment - 05/09/16 0739    Subjective Reports that MD was pleased with progress and stated that the medial knee pain was from an incision and may be the last thing to heal. States that MD wants him to continue 3x/week until he returns to MD.   Limitations Walking   How long can you walk  comfortably? Short distances.   Patient Stated Goals Get back to an active lifestyle.   Currently in Pain? Yes   Pain Score 3    Pain Location Knee   Pain Orientation Left;Medial   Pain Type Surgical pain            [facility] PT Assessment - 05/09/16 0001    Assessment   Medical Diagnosis Left total knee replacement   Onset Date/Surgical Date 03/05/16   Next MD Visit 06/2016   Restrictions   Weight Bearing Restrictions No   ROM / Strength   AROM / PROM / Strength AROM   AROM   Overall AROM  Deficits   AROM Assessment Site Knee   Right/Left Knee Left   Left Knee Extension -3                     [facility] Adult PT Treatment/Exercise - 05/09/16 0001    Knee/Hip Exercises: Aerobic   Stationary Bike Seat 7 x10 min   Knee/Hip Exercises: Machines for Strengthening   Cybex Knee Extension 20# 3x10 reps   Cybex Knee Flexion 40# 3x10 reps   Cybex Leg Press 3 pl, seat 5,  3x10 reps   Knee/Hip Exercises: Standing   Step Down Left;3 sets;10 reps;Hand Hold: 2;Step Height: 4"   Wall Squat 1 set;10 reps   Modalities   Modalities Vasopneumatic;Electrical Stimulation   Electrical Stimulation   Electrical Stimulation Location L knee   Electrical Stimulation Action IFC   Electrical Stimulation Parameters 1-10 hz x15 min   Electrical Stimulation Goals Edema;Pain   Vasopneumatic   Number Minutes Vasopneumatic  15 minutes   Vasopnuematic Location  Knee   Vasopneumatic Pressure Medium   Vasopneumatic Temperature  63   Manual Therapy   Manual Therapy Soft tissue mobilization;Passive ROM   Soft tissue mobilization L patellar mobilizations in sup/inf, L/R and L incision mobilizations to decrease adhesions that may be present   Passive ROM PROM of L knee ext with gentle holds at end range                   PT Short Term Goals - 05/09/16 0810    PT SHORT TERM GOAL #1   Title Ind with an initial HEP.   Time 4   Period Weeks   Status Achieved   PT SHORT TERM GOAL #2    Title Achieve full left knee extension to normalize gait.   Time 4   Period Weeks   Status On-going  AROM L knee extension -3 deg from neutral 05/09/2016   PT SHORT TERM GOAL #3   Title Active left knee flexion to 90 degrees.   Time 4   Period Weeks   Status Achieved  AROM L knee flexion 94 deg 04/04/2016           PT Long Term Goals - 05/09/16 0811    PT LONG TERM GOAL #1   Title Ind with an advanced HEP.   Time 8   Period Weeks   Status On-going   PT LONG TERM GOAL #2   Title Active left knee flexion to 115 degrees+ so the patient can perform functional tasks and do so with pain not > 2-3/10.   Time 8   Period Weeks   Status Achieved  AROM L knee flexion 115 deg 05/07/2016   PT LONG TERM GOAL #3   Title Decrease edema to within 2.5 cms of non-affected side to assist with pain reduction and range of motion gains.   Time 8   Period Weeks   Status Achieved  1.4 cm difference edema with L>R 04/04/2016   PT LONG TERM GOAL #4   Title Increase left knee strength to a solid 5/5 to provide good stability for accomplishment of functional activities   Time 8   Period Weeks   Status Achieved  L hip flex/abduct and L knee flex/ext MMT 5/5 05/07/2016   PT LONG TERM GOAL #5   Title Perform a reciprocating stair gait with one railing with pain not > 2-3/10.   Time 8   Period Weeks   Status Partially Met  Able to ambulate stair reciprically but has sharp lateral knee pain per patient report 04/27/2016               Plan - 05/09/16 0814    Clinical Impression Statement Patient continues to improve with L knee strengthening and ROM today with advancement of exercises. Patient required VCs to not descend further than when knees and hips are at 90 deg angle. Patient was educated to add wall squats to HEP and denied need for paper handout. Patient's L knee extension measured as 3 deg  from neutral in supine today. Patient continues to demonstrate good L knee incision and patellar mobility.  Normal modalites response noted following removal of the modalities.   Rehab Potential Good   Clinical Impairments Affecting Rehab Potential Multiple previous knee surgeries and 2 significant setbacks since surggery.   PT Frequency 3x / week   PT Duration 8 weeks   PT Treatment/Interventions ADLs/Self Care Home Management;Cryotherapy;Electrical Stimulation;Moist Heat;Therapeutic exercise;Therapeutic activities;Stair training;Gait training;Neuromuscular re-education;Patient/family education;Manual techniques;Passive range of motion;Vasopneumatic Device   PT Next Visit Plan Continue per TKR protocol and MPT POC and continue machine strengthening next treatment.    PT Home Exercise Plan HEP- HS stretch, forward lunges and verbal education regarding prone hangs   Consulted and Agree with Plan of Care Patient      Patient will benefit from skilled therapeutic intervention in order to improve the following deficits and impairments:  Abnormal gait, Decreased activity tolerance, Pain, Increased edema, Decreased strength, Decreased range of motion  Visit Diagnosis: Pain in left knee - Plan: PT plan of care cert/re-cert  Stiffness of knee joint, left - Plan: PT plan of care cert/re-cert  Localized edema - Plan: PT plan of care cert/re-cert     Problem List Patient Active Problem List   Diagnosis Date Noted  . History of pulmonary embolism 03/06/2016  . Primary osteoarthritis of left knee 03/05/2016   Ahmed Prima, PTA 05/09/2016 3:09 PM Mali Applegate MPT [facility] [place], [place], [address] Phone: [phone]   Fax:  [phone]  Name: Jeremy [name] MRN: [id number] Date of Birth: Feb 09, 1961

## 2016-05-11 ENCOUNTER — Encounter: Payer: PRIVATE HEALTH INSURANCE | Admitting: Physical Therapy

## 2016-05-14 ENCOUNTER — Encounter: Payer: Self-pay | Admitting: Physical Therapy

## 2016-05-14 ENCOUNTER — Ambulatory Visit: Payer: PRIVATE HEALTH INSURANCE | Admitting: Physical Therapy

## 2016-05-14 DIAGNOSIS — R600 Localized edema: Secondary | ICD-10-CM

## 2016-05-14 DIAGNOSIS — M25662 Stiffness of left knee, not elsewhere classified: Secondary | ICD-10-CM

## 2016-05-14 DIAGNOSIS — M25562 Pain in left knee: Secondary | ICD-10-CM | POA: Diagnosis not present

## 2016-05-14 NOTE — Therapy (Signed)
[facility] [place], [place], [address] Phone: [phone]   Fax:  [phone]  Physical Therapy Treatment  Patient Details  Name: Jeremy [name] MRN: [id number] Date of Birth: 30-Jul-1961 Referring Provider: Dorna Leitz MD  Encounter Date: 05/14/2016      PT End of Session - 05/14/16 0732    Visit Number 20   Number of Visits 32   Date for PT Re-Evaluation 06/19/16   PT Start Time 0732   PT Stop Time 0818   PT Time Calculation (min) 46 min   Activity Tolerance Patient tolerated treatment well   Behavior During Therapy [facility] for tasks assessed/performed      Past Medical History  Diagnosis Date  . PE (pulmonary embolism) 1992    post op- 1 week  . Peripheral vascular disease ([place])     post op DVT-  approx. 2009, treated at [facility]   . Hypertension   . Seasonal allergies   . H/O renal calculi     has been passed spontaneously but also had cystoscopy for one   . GERD (gastroesophageal reflux disease)   . Headache     migraines on occasion - used imitrex 5- 6 yrs. ago   . Arthritis     both knees     Past Surgical History  Procedure Laterality Date  . Knee surgery      8 arthroscopies & one repair of tendon- R   . Ankle surgery Right     repair of fracture & repair of torn tendons.  . Vocal cord injection      bilateral medialization, implants on both vocal chords   . Tonsillectomy    . Total knee arthroplasty Left 03/05/2016    Procedure: LEFT TOTAL KNEE ARTHROPLASTY;  Surgeon: Dorna Leitz, MD;  Location: [facility];  Service: Orthopedics;  Laterality: Left;    There were no vitals filed for this visit.      Subjective Assessment - 05/14/16 0732    Subjective Reports his knee did good over the weekend as he was getting up and down from tractor getting up hay. Had no pain with knee over the weekend with actviity.   Limitations Walking   How long can you walk comfortably? Short distances.   Patient Stated  Goals Get back to an active lifestyle.   Currently in Pain? No/denies            [facility] PT Assessment - 05/14/16 0001    Assessment   Medical Diagnosis Left total knee replacement   Onset Date/Surgical Date 03/05/16   Next MD Visit 06/2016   Restrictions   Weight Bearing Restrictions No                     [facility] Adult PT Treatment/Exercise - 05/14/16 0001    Knee/Hip Exercises: Aerobic   Stationary Bike Seat 7 x13 min   Knee/Hip Exercises: Machines for Strengthening   Cybex Knee Extension 20# 3x10 reps   Cybex Knee Flexion 40# 3x10 reps   Cybex Leg Press 3 pl, seat 5, 3x10 reps   Knee/Hip Exercises: Standing   Step Down Left;3 sets;10 reps;Hand Hold: 2;Step Height: 4"   Wall Squat 2 sets;10 reps   Other Standing Knee Exercises DLS on BOSU in squat position 5 trials x max hold for quad strengthening   Modalities   Modalities Vasopneumatic;Electrical Stimulation   Electrical Stimulation   Electrical Stimulation Location L knee   Electrical Stimulation Action IFC  Electrical Stimulation Parameters 1-10 hz x15 min   Electrical Stimulation Goals Edema;Pain   Vasopneumatic   Number Minutes Vasopneumatic  15 minutes   Vasopnuematic Location  Knee   Vasopneumatic Pressure Medium   Vasopneumatic Temperature  74                  PT Short Term Goals - 05/09/16 0810    PT SHORT TERM GOAL #1   Title Ind with an initial HEP.   Time 4   Period Weeks   Status Achieved   PT SHORT TERM GOAL #2   Title Achieve full left knee extension to normalize gait.   Time 4   Period Weeks   Status On-going  AROM L knee extension -3 deg from neutral 05/09/2016   PT SHORT TERM GOAL #3   Title Active left knee flexion to 90 degrees.   Time 4   Period Weeks   Status Achieved  AROM L knee flexion 94 deg 04/04/2016           PT Long Term Goals - 05/09/16 0811    PT LONG TERM GOAL #1   Title Ind with an advanced HEP.   Time 8   Period Weeks   Status On-going   PT  LONG TERM GOAL #2   Title Active left knee flexion to 115 degrees+ so the patient can perform functional tasks and do so with pain not > 2-3/10.   Time 8   Period Weeks   Status Achieved  AROM L knee flexion 115 deg 05/07/2016   PT LONG TERM GOAL #3   Title Decrease edema to within 2.5 cms of non-affected side to assist with pain reduction and range of motion gains.   Time 8   Period Weeks   Status Achieved  1.4 cm difference edema with L>R 04/04/2016   PT LONG TERM GOAL #4   Title Increase left knee strength to a solid 5/5 to provide good stability for accomplishment of functional activities   Time 8   Period Weeks   Status Achieved  L hip flex/abduct and L knee flex/ext MMT 5/5 05/07/2016   PT LONG TERM GOAL #5   Title Perform a reciprocating stair gait with one railing with pain not > 2-3/10.   Time 8   Period Weeks   Status Partially Met  Able to ambulate stair reciprically but has sharp lateral knee pain per patient report 04/27/2016               Plan - 05/14/16 0805    Clinical Impression Statement Patient continues to demonstrate improved L knee strength and is able to tolerate more repititions for wall squats today with no reports of pain or discomfort. No knee adduction was noted with leg press strengthening at this time. Patient required moderate mutlimodal cueing for [facility] on Bosu exercise for quad strengthening although if exercise is continued may require more cueing to perfect form. Normal modalities response noted following removal of the modalities. Last remaining goals continue to be on-going at this time secondary to L knee ext deficit, reciprical stair gait. Small area of superior most L knee incision was observed as eliminating a very small amount of fluid that was yellow/red in coloration.   Rehab Potential Good   Clinical Impairments Affecting Rehab Potential Multiple previous knee surgeries and 2 significant setbacks since surggery.   PT Frequency 3x / week   PT  Duration 8 weeks   PT Treatment/Interventions ADLs/Self Care Home  Management;Cryotherapy;Electrical Stimulation;Moist Heat;Therapeutic exercise;Therapeutic activities;Stair training;Gait training;Neuromuscular re-education;Patient/family education;Manual techniques;Passive range of motion;Vasopneumatic Device   PT Next Visit Plan Continue per TKR protocol and MPT POC and continue machine strengthening next treatment.    PT Home Exercise Plan HEP- HS stretch, forward lunges and verbal education regarding prone hangs   Consulted and Agree with Plan of Care Patient      Patient will benefit from skilled therapeutic intervention in order to improve the following deficits and impairments:  Abnormal gait, Decreased activity tolerance, Pain, Increased edema, Decreased strength, Decreased range of motion  Visit Diagnosis: Pain in left knee  Stiffness of knee joint, left  Localized edema     Problem List Patient Active Problem List   Diagnosis Date Noted  . History of pulmonary embolism 03/06/2016  . Primary osteoarthritis of left knee 03/05/2016    Wynelle Fanny, PTA 05/14/2016, 8:20 AM  [facility] [address] [place], [place], [address] Phone: [phone]   Fax:  [phone]  Name: Jeremy [name] MRN: [id number] Date of Birth: 1961/06/01

## 2016-05-16 ENCOUNTER — Encounter: Payer: PRIVATE HEALTH INSURANCE | Admitting: Physical Therapy

## 2016-05-18 ENCOUNTER — Ambulatory Visit: Payer: PRIVATE HEALTH INSURANCE | Admitting: Physical Therapy

## 2016-05-18 ENCOUNTER — Encounter: Payer: Self-pay | Admitting: Physical Therapy

## 2016-05-18 DIAGNOSIS — M25562 Pain in left knee: Secondary | ICD-10-CM

## 2016-05-18 DIAGNOSIS — M25662 Stiffness of left knee, not elsewhere classified: Secondary | ICD-10-CM

## 2016-05-18 DIAGNOSIS — R600 Localized edema: Secondary | ICD-10-CM

## 2016-05-18 NOTE — Therapy (Signed)
[facility] [place], [place], [address] Phone: [phone]   Fax:  [phone]  Physical Therapy Treatment  Patient Details  Name: Jeremy [name] MRN: [id number] Date of Birth: 30-Jun-1961 Referring Provider: Dorna Leitz MD  Encounter Date: 05/18/2016      PT End of Session - 05/18/16 0738    Visit Number 21   Number of Visits 32   Date for PT Re-Evaluation 06/19/16   PT Start Time 0730   PT Stop Time 0817   PT Time Calculation (min) 47 min   Activity Tolerance Patient tolerated treatment well   Behavior During Therapy [facility] for tasks assessed/performed      Past Medical History  Diagnosis Date  . PE (pulmonary embolism) 1992    post op- 1 week  . Peripheral vascular disease ([place])     post op DVT-  approx. 2009, treated at [facility]   . Hypertension   . Seasonal allergies   . H/O renal calculi     has been passed spontaneously but also had cystoscopy for one   . GERD (gastroesophageal reflux disease)   . Headache     migraines on occasion - used imitrex 5- 6 yrs. ago   . Arthritis     both knees     Past Surgical History  Procedure Laterality Date  . Knee surgery      8 arthroscopies & one repair of tendon- R   . Ankle surgery Right     repair of fracture & repair of torn tendons.  . Vocal cord injection      bilateral medialization, implants on both vocal chords   . Tonsillectomy    . 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      Subjective Assessment - 05/18/16 0738    Subjective Reports that he doesn't have the medial and lateral knee pain as often anymore.   Limitations Walking   How long can you walk comfortably? Short distances.   Patient Stated Goals Get back to an active lifestyle.   Currently in Pain? No/denies             [facility] PT Assessment - 05/18/16 0001    Assessment   Medical Diagnosis Left total knee replacement   Onset Date/Surgical Date 03/05/16   Next MD Visit 06/2016   Restrictions   Weight Bearing Restrictions No   ROM / Strength   AROM / PROM / Strength AROM   AROM   Overall AROM  Within functional limits for tasks performed   AROM Assessment Site Knee   Right/Left Knee Left   Left Knee Extension 0                     [facility] Adult PT Treatment/Exercise - 05/18/16 0001    Knee/Hip Exercises: Aerobic   Stationary Bike Seat 7 x13 min   Knee/Hip Exercises: Machines for Strengthening   Cybex Knee Extension 20# 3x10 reps   Cybex Knee Flexion 40# 3x10 reps   Cybex Leg Press 3 pl, seat 5, 3x10 reps   Knee/Hip Exercises: Supine   Single Leg Bridge Strengthening;Left;2 sets;10 reps  Attempted exercise although with demo, wrong technique   Modalities   Modalities Vasopneumatic;Electrical Stimulation   Electrical Stimulation   Electrical Stimulation Location L knee  Electrical Stimulation Action IFC   Electrical Stimulation Parameters 1-10 hz x15 min   Electrical Stimulation Goals Edema;Pain   Vasopneumatic   Number Minutes Vasopneumatic  15 minutes   Vasopnuematic Location  Knee   Vasopneumatic Pressure Medium   Vasopneumatic Temperature  59   Manual Therapy   Manual Therapy Passive ROM   Passive ROM PROM of L knee ext with gentle holds at end range                   PT Short Term Goals - 05/18/16 3151    PT SHORT TERM GOAL #1   Title Ind with an initial HEP.   Time 4   Period Weeks   Status Achieved   PT SHORT TERM GOAL #2   Title Achieve full left knee extension to normalize gait.   Time 4   Period Weeks   Status Achieved  AROM L knee extension 0 deg from neutral 05/18/2016   PT SHORT TERM GOAL #3   Title Active left knee flexion to 90 degrees.   Time 4   Period Weeks   Status Achieved  AROM L knee flexion 94 deg 04/04/2016            PT Long Term Goals - 05/18/16 0738    PT LONG TERM GOAL #1   Title Ind with an advanced HEP.   Time 8   Period Weeks   Status Achieved   PT LONG TERM GOAL #2   Title Active left knee flexion to 115 degrees+ so the patient can perform functional tasks and do so with pain not > 2-3/10.   Time 8   Period Weeks   Status Achieved  AROM L knee flexion 115 deg 05/07/2016   PT LONG TERM GOAL #3   Title Decrease edema to within 2.5 cms of non-affected side to assist with pain reduction and range of motion gains.   Time 8   Period Weeks   Status Achieved  1.4 cm difference edema with L>R 04/04/2016   PT LONG TERM GOAL #4   Title Increase left knee strength to a solid 5/5 to provide good stability for accomplishment of functional activities   Time 8   Period Weeks   Status Achieved  L hip flex/abduct and L knee flex/ext MMT 5/5 05/07/2016   PT LONG TERM GOAL #5   Title Perform a reciprocating stair gait with one railing with pain not > 2-3/10.   Time 8   Period Weeks   Status Partially Met  Able to ambulate stair reciprically but has sharp lateral knee pain per patient report 04/27/2016               Plan - 05/18/16 0805    Clinical Impression Statement Patient continues to progress with machine strengthening and ROM of L knee at this time. Patient able to tolerate increased resistance with leg press today without complaint of pain/soreness. Patient required moderate multimodal cueing such as demonstration although patient verbalized understanding of tecnique, he performed exercise incorrectly. No complaints with PROM of L knee into extension and was able to achieve 0 deg extension. Normal modaliites response noted following removal of the modalities.   Rehab Potential Good   Clinical Impairments Affecting Rehab Potential Multiple previous knee surgeries and 2 significant setbacks since surggery.   PT Frequency 3x / week   PT Duration 8 weeks   PT Treatment/Interventions ADLs/Self  Care Home Management;Cryotherapy;Electrical Stimulation;Moist Heat;Therapeutic exercise;Therapeutic activities;Stair training;Gait training;Neuromuscular re-education;Patient/family education;Manual  techniques;Passive range of motion;Vasopneumatic Device   PT Next Visit Plan Continue per TKR protocol and MPT POC and continue machine strengthening next treatment.    PT Home Exercise Plan HEP- HS stretch, forward lunges and verbal education regarding prone hangs   Consulted and Agree with Plan of Care Patient      Patient will benefit from skilled therapeutic intervention in order to improve the following deficits and impairments:  Abnormal gait, Decreased activity tolerance, Pain, Increased edema, Decreased strength, Decreased range of motion  Visit Diagnosis: Pain in left knee  Stiffness of knee joint, left  Localized edema     Problem List Patient Active Problem List   Diagnosis Date Noted  . History of pulmonary embolism 03/06/2016  . Primary osteoarthritis of left knee 03/05/2016    Wynelle Fanny, PT 05/18/2016, 8:22 AM  [facility] [address] [place], [place], [address] Phone: [phone]   Fax:  [phone]  Name: HAGER COMPSTON MRN: [id number] Date of Birth: [birth year]

## 2016-05-21 ENCOUNTER — Ambulatory Visit: Payer: PRIVATE HEALTH INSURANCE | Admitting: Physical Therapy

## 2016-05-21 ENCOUNTER — Encounter: Payer: Self-pay | Admitting: Physical Therapy

## 2016-05-21 DIAGNOSIS — M25662 Stiffness of left knee, not elsewhere classified: Secondary | ICD-10-CM

## 2016-05-21 DIAGNOSIS — R600 Localized edema: Secondary | ICD-10-CM

## 2016-05-21 DIAGNOSIS — M25562 Pain in left knee: Secondary | ICD-10-CM

## 2016-05-21 NOTE — Therapy (Signed)
[facility] [place], [place], [address] Phone: [phone]   Fax:  [phone]  Physical Therapy Treatment  Patient Details  Name: Jeremy [name] MRN: [id number] Date of Birth: September 14, 1961 Referring Provider: Dorna Leitz MD  Encounter Date: 05/21/2016      PT End of Session - 05/21/16 0738    Visit Number 22   Number of Visits 32   Date for PT Re-Evaluation 06/19/16   PT Start Time 0739   PT Stop Time 0820   PT Time Calculation (min) 41 min   Activity Tolerance Patient tolerated treatment well   Behavior During Therapy [facility] for tasks assessed/performed      Past Medical History  Diagnosis Date  . PE (pulmonary embolism) 1992    post op- 1 week  . Peripheral vascular disease ([place])     post op DVT-  approx. 2009, treated at [facility]   . Hypertension   . Seasonal allergies   . H/O renal calculi     has been passed spontaneously but also had cystoscopy for one   . GERD (gastroesophageal reflux disease)   . Headache     migraines on occasion - used imitrex 5- 6 yrs. ago   . Arthritis     both knees     Past Surgical History  Procedure Laterality Date  . Knee surgery      8 arthroscopies & one repair of tendon- R   . Ankle surgery Right     repair of fracture & repair of torn tendons.  . Vocal cord injection      bilateral medialization, implants on both vocal chords   . Tonsillectomy    . Total knee arthroplasty Left 03/05/2016    Procedure: LEFT TOTAL KNEE ARTHROPLASTY;  Surgeon: Dorna Leitz, MD;  Location: [place];  Service: Orthopedics;  Laterality: Left;    There were no vitals filed for this visit.      Subjective Assessment - 05/21/16 0738    Subjective Reports he is late due to road closure. Reports that he is experiencing L knee tightness today secondary to push mowing his yard yesterday.   Limitations Walking   How long can you walk comfortably? Short distances.   Patient Stated Goals Get back to  an active lifestyle.   Currently in Pain? Yes   Pain Score 3    Pain Location Knee   Pain Orientation Left;Medial;Lateral   Pain Descriptors / Indicators Tightness   Pain Type Surgical pain            [facility] PT Assessment - 05/21/16 0001    Assessment   Medical Diagnosis Left total knee replacement   Onset Date/Surgical Date 03/05/16   Next MD Visit 06/2016   Restrictions   Weight Bearing Restrictions No                     [facility] Adult PT Treatment/Exercise - 05/21/16 0001    Knee/Hip Exercises: Aerobic   Stationary Bike Seat 7 x12 min   Knee/Hip Exercises: Machines for Strengthening   Cybex Knee Extension 20# 3x10 reps   Cybex Knee Flexion 40# 3x10 reps   Cybex Leg Press 4 pl, seat 5, 3x10 reps   Knee/Hip Exercises: Supine   Single Leg Bridge Strengthening;Left;2 sets;10 reps   Straight Leg Raise with External Rotation Strengthening;Left;3 sets;10 reps   Knee/Hip Exercises: Sidelying   Hip ABduction Strengthening;Left;3 sets;10 reps   Modalities   Modalities Vasopneumatic;Electrical Stimulation  Acupuncturist Location L knee   Chartered certified accountant IFC   Electrical Stimulation Parameters 1-10 hz x15 min   Electrical Stimulation Goals Edema;Pain   Vasopneumatic   Number Minutes Vasopneumatic  15 minutes   Vasopnuematic Location  Knee   Vasopneumatic Pressure Medium   Vasopneumatic Temperature  70                  PT Short Term Goals - 05/18/16 9735    PT SHORT TERM GOAL #1   Title Ind with an initial HEP.   Time 4   Period Weeks   Status Achieved   PT SHORT TERM GOAL #2   Title Achieve full left knee extension to normalize gait.   Time 4   Period Weeks   Status Achieved  AROM L knee extension 0 deg from neutral 05/18/2016   PT SHORT TERM GOAL #3   Title Active left knee flexion to 90 degrees.   Time 4   Period Weeks   Status Achieved  AROM L knee flexion 94 deg 04/04/2016           PT  Long Term Goals - 05/18/16 0738    PT LONG TERM GOAL #1   Title Ind with an advanced HEP.   Time 8   Period Weeks   Status Achieved   PT LONG TERM GOAL #2   Title Active left knee flexion to 115 degrees+ so the patient can perform functional tasks and do so with pain not > 2-3/10.   Time 8   Period Weeks   Status Achieved  AROM L knee flexion 115 deg 05/07/2016   PT LONG TERM GOAL #3   Title Decrease edema to within 2.5 cms of non-affected side to assist with pain reduction and range of motion gains.   Time 8   Period Weeks   Status Achieved  1.4 cm difference edema with L>R 04/04/2016   PT LONG TERM GOAL #4   Title Increase left knee strength to a solid 5/5 to provide good stability for accomplishment of functional activities   Time 8   Period Weeks   Status Achieved  L hip flex/abduct and L knee flex/ext MMT 5/5 05/07/2016   PT LONG TERM GOAL #5   Title Perform a reciprocating stair gait with one railing with pain not > 2-3/10.   Time 8   Period Weeks   Status Partially Met  Able to ambulate stair reciprically but has sharp lateral knee pain per patient report 04/27/2016               Plan - 05/21/16 0809    Clinical Impression Statement Patient continues to progress in regards to LLE strengthening today although he arrived to treatment with reports of L knee tightness. Patient able to tolerate increased resistance with leg press resistance today without report of increased pain. Patient reported experiencing L inferiolateral knee tightness during LLE SL bridge that he completed with moderate to maximal multimodal cueing with instability noted with bridge completion. L hip abductor weakness also noted today with moderate multimodal cueing for proper hip alignment and proper exercise technique. Normal modalities response noted following removal of the modalities.   Rehab Potential Good   Clinical Impairments Affecting Rehab Potential Multiple previous knee surgeries and 2  significant setbacks since surggery.   PT Frequency 3x / week   PT Duration 8 weeks   PT Treatment/Interventions ADLs/Self Care Home Management;Cryotherapy;Electrical Stimulation;Moist Heat;Therapeutic exercise;Therapeutic activities;Stair training;Gait  training;Neuromuscular re-education;Patient/family education;Manual techniques;Passive range of motion;Vasopneumatic Device   PT Next Visit Plan Continue per TKR protocol and MPT POC and continue machine strengthening next treatment.    PT Home Exercise Plan HEP- HS stretch, forward lunges and verbal education regarding prone hangs   Consulted and Agree with Plan of Care Patient      Patient will benefit from skilled therapeutic intervention in order to improve the following deficits and impairments:  Abnormal gait, Decreased activity tolerance, Pain, Increased edema, Decreased strength, Decreased range of motion  Visit Diagnosis: Pain in left knee  Stiffness of knee joint, left  Localized edema     Problem List Patient Active Problem List   Diagnosis Date Noted  . History of pulmonary embolism 03/06/2016  . Primary osteoarthritis of left knee 03/05/2016    Wynelle Fanny, PTA 05/21/2016, 8:24 AM  [facility] [address] [place], [place], [address] Phone: [phone]   Fax:  [phone]  Name: Jeremy [name] MRN: [id number] Date of Birth: Apr 12, 1961

## 2016-05-23 ENCOUNTER — Ambulatory Visit: Payer: PRIVATE HEALTH INSURANCE | Admitting: Physical Therapy

## 2016-05-23 ENCOUNTER — Encounter: Payer: Self-pay | Admitting: Physical Therapy

## 2016-05-23 DIAGNOSIS — R600 Localized edema: Secondary | ICD-10-CM

## 2016-05-23 DIAGNOSIS — M25562 Pain in left knee: Secondary | ICD-10-CM

## 2016-05-23 DIAGNOSIS — M25662 Stiffness of left knee, not elsewhere classified: Secondary | ICD-10-CM

## 2016-05-23 NOTE — Therapy (Signed)
[facility] [place], [place], [address] Phone: [phone]   Fax:  [phone]  Physical Therapy Treatment  Patient Details  Name: Jeremy [name] MRN: [id number] Date of Birth: 1961-01-13 Referring Provider: Dorna Leitz MD  Encounter Date: 05/23/2016      PT End of Session - 05/23/16 0731    Visit Number 23   Number of Visits 32   Date for PT Re-Evaluation 06/19/16   PT Start Time 0731   PT Stop Time 0814   PT Time Calculation (min) 43 min   Activity Tolerance Patient tolerated treatment well   Behavior During Therapy [facility] for tasks assessed/performed      Past Medical History  Diagnosis Date  . PE (pulmonary embolism) 1992    post op- 1 week  . Peripheral vascular disease ([place])     post op DVT-  approx. 2009, treated at [facility]   . Hypertension   . Seasonal allergies   . H/O renal calculi     has been passed spontaneously but also had cystoscopy for one   . GERD (gastroesophageal reflux disease)   . Headache     migraines on occasion - used imitrex 5- 6 yrs. ago   . Arthritis     both knees     Past Surgical History  Procedure Laterality Date  . Knee surgery      8 arthroscopies & one repair of tendon- R   . Ankle surgery Right     repair of fracture & repair of torn tendons.  . Vocal cord injection      bilateral medialization, implants on both vocal chords   . Tonsillectomy    . Total knee arthroplasty Left 03/05/2016    Procedure: LEFT TOTAL KNEE ARTHROPLASTY;  Surgeon: Dorna Leitz, MD;  Location: [facility];  Service: Orthopedics;  Laterality: Left;    There were no vitals filed for this visit.      Subjective Assessment - 05/23/16 0731    Subjective Reports that he mowed yesterday and that may be what caused his knee pain.   Limitations Walking   How long can you walk comfortably? Short distances.   Patient Stated Goals Get back to an active lifestyle.   Currently in Pain? Yes   Pain Score 2    Pain Location Knee   Pain Orientation Left;Distal;Lateral   Pain Type Surgical pain            [facility] PT Assessment - 05/23/16 0001    Assessment   Medical Diagnosis Left total knee replacement   Onset Date/Surgical Date 03/05/16   Next MD Visit 06/2016   Restrictions   Weight Bearing Restrictions No                     [facility] Adult PT Treatment/Exercise - 05/23/16 0001    Knee/Hip Exercises: Aerobic   Stationary Bike Seat 7 x12 min   Knee/Hip Exercises: Machines for Strengthening   Cybex Knee Extension 20# 3x10 reps   Cybex Knee Flexion 40# 3x10 reps   Cybex Leg Press 4 pl, seat 5, 3x10 reps   Knee/Hip Exercises: Standing   Terminal Knee Extension Limitations LLE red theraband 3x10 reps 3 sec hold   Knee/Hip Exercises: Supine   Single Leg Bridge Strengthening;Left;2 sets;10 reps   Knee/Hip Exercises: Sidelying   Hip ABduction Strengthening;Left;3 sets;10 reps   Modalities   Modalities Vasopneumatic;Electrical Stimulation   Electrical Stimulation   Electrical Stimulation Location L knee  Electrical Stimulation Action IFC   Electrical Stimulation Parameters 1-10 hz x15 min   Electrical Stimulation Goals Edema;Pain   Vasopneumatic   Number Minutes Vasopneumatic  15 minutes   Vasopnuematic Location  Knee   Vasopneumatic Pressure Medium   Vasopneumatic Temperature  64                  PT Short Term Goals - 05/18/16 8657    PT SHORT TERM GOAL #1   Title Ind with an initial HEP.   Time 4   Period Weeks   Status Achieved   PT SHORT TERM GOAL #2   Title Achieve full left knee extension to normalize gait.   Time 4   Period Weeks   Status Achieved  AROM L knee extension 0 deg from neutral 05/18/2016   PT SHORT TERM GOAL #3   Title Active left knee flexion to 90 degrees.   Time 4   Period Weeks   Status Achieved  AROM L knee flexion 94 deg 04/04/2016           PT Long Term Goals - 05/18/16 0738    PT LONG TERM GOAL #1   Title Ind with an  advanced HEP.   Time 8   Period Weeks   Status Achieved   PT LONG TERM GOAL #2   Title Active left knee flexion to 115 degrees+ so the patient can perform functional tasks and do so with pain not > 2-3/10.   Time 8   Period Weeks   Status Achieved  AROM L knee flexion 115 deg 05/07/2016   PT LONG TERM GOAL #3   Title Decrease edema to within 2.5 cms of non-affected side to assist with pain reduction and range of motion gains.   Time 8   Period Weeks   Status Achieved  1.4 cm difference edema with L>R 04/04/2016   PT LONG TERM GOAL #4   Title Increase left knee strength to a solid 5/5 to provide good stability for accomplishment of functional activities   Time 8   Period Weeks   Status Achieved  L hip flex/abduct and L knee flex/ext MMT 5/5 05/07/2016   PT LONG TERM GOAL #5   Title Perform a reciprocating stair gait with one railing with pain not > 2-3/10.   Time 8   Period Weeks   Status Partially Met  Able to ambulate stair reciprically but has sharp lateral knee pain per patient report 04/27/2016               Plan - 05/23/16 0801    Clinical Impression Statement Patient continues to progress well following L TKR with only report of distolateral pain this morning. Patient able to complete machine strengthening well and may be able to progress with resistance next treatment. Patient has achieved all goals except for stair ambulation goal which patient reports is getting easier. Normal modalities response noted following removal of the modalities.   Rehab Potential Good   Clinical Impairments Affecting Rehab Potential Multiple previous knee surgeries and 2 significant setbacks since surggery.   PT Frequency 3x / week   PT Duration 8 weeks   PT Treatment/Interventions ADLs/Self Care Home Management;Cryotherapy;Electrical Stimulation;Moist Heat;Therapeutic exercise;Therapeutic activities;Stair training;Gait training;Neuromuscular re-education;Patient/family education;Manual  techniques;Passive range of motion;Vasopneumatic Device   PT Next Visit Plan Continue per TKR protocol and MPT POC and continue machine strengthening next treatment.    PT Home Exercise Plan HEP- HS stretch, forward lunges and verbal education regarding prone  hangs   Consulted and Agree with Plan of Care Patient      Patient will benefit from skilled therapeutic intervention in order to improve the following deficits and impairments:  Abnormal gait, Decreased activity tolerance, Pain, Increased edema, Decreased strength, Decreased range of motion  Visit Diagnosis: Pain in left knee  Stiffness of knee joint, left  Localized edema     Problem List Patient Active Problem List   Diagnosis Date Noted  . History of pulmonary embolism 03/06/2016  . Primary osteoarthritis of left knee 03/05/2016    Wynelle Fanny, PTA 05/23/2016, 8:17 AM  [facility] [place], [place], [address] Phone: [phone]   Fax:  [phone]  Name: TERRIAN SENTELL MRN: [id number] Date of Birth: 1961/10/08

## 2016-05-25 ENCOUNTER — Ambulatory Visit: Payer: PRIVATE HEALTH INSURANCE | Admitting: Physical Therapy

## 2016-05-25 ENCOUNTER — Encounter: Payer: Self-pay | Admitting: Physical Therapy

## 2016-05-25 DIAGNOSIS — M25562 Pain in left knee: Secondary | ICD-10-CM | POA: Diagnosis not present

## 2016-05-25 DIAGNOSIS — M25662 Stiffness of left knee, not elsewhere classified: Secondary | ICD-10-CM

## 2016-05-25 DIAGNOSIS — R600 Localized edema: Secondary | ICD-10-CM

## 2016-05-25 NOTE — Therapy (Signed)
[facility] [place], [place], [address] Phone: [phone]   Fax:  [phone]  Physical Therapy Treatment  Patient Details  Name: Jeremy [name] MRN: [id number] Date of Birth: 19-Apr-1961 Referring Provider: Dorna Leitz MD  Encounter Date: 05/25/2016      PT End of Session - 05/25/16 0742    Visit Number 24   Number of Visits 32   Date for PT Re-Evaluation 06/19/16   PT Start Time 0732   PT Stop Time 0814   PT Time Calculation (min) 42 min   Activity Tolerance Patient tolerated treatment well   Behavior During Therapy [facility] for tasks assessed/performed      Past Medical History  Diagnosis Date  . PE (pulmonary embolism) 1992    post op- 1 week  . Peripheral vascular disease ([place])     post op DVT-  approx. 2009, treated at [facility]   . Hypertension   . Seasonal allergies   . H/O renal calculi     has been passed spontaneously but also had cystoscopy for one   . GERD (gastroesophageal reflux disease)   . Headache     migraines on occasion - used imitrex 5- 6 yrs. ago   . Arthritis     both knees     Past Surgical History  Procedure Laterality Date  . Knee surgery      8 arthroscopies & one repair of tendon- R   . Ankle surgery Right     repair of fracture & repair of torn tendons.  . Vocal cord injection      bilateral medialization, implants on both vocal chords   . Tonsillectomy    . Total knee arthroplasty Left 03/05/2016    Procedure: LEFT TOTAL KNEE ARTHROPLASTY;  Surgeon: Dorna Leitz, MD;  Location: [place];  Service: Orthopedics;  Laterality: Left;    There were no vitals filed for this visit.      Subjective Assessment - 05/25/16 0740    Subjective Reports tightness in L knee this morning although he denies pain. Patient also denies sharp pains during stair ambulation in L knee.   Limitations Walking   How long can you walk comfortably? Short distances.   Patient Stated Goals Get back to an  active lifestyle.   Currently in Pain? No/denies            [facility] PT Assessment - 05/25/16 0001    Assessment   Medical Diagnosis Left total knee replacement   Onset Date/Surgical Date 03/05/16   Next MD Visit 06/2016   Restrictions   Weight Bearing Restrictions No   ROM / Strength   AROM / PROM / Strength AROM   AROM   Overall AROM  Within functional limits for tasks performed   AROM Assessment Site Knee   Right/Left Knee Left   Left Knee Extension -5   Left Knee Flexion 116                     [facility] Adult PT Treatment/Exercise - 05/25/16 0001    Ambulation/Gait   Stairs Yes   Stairs Assistance 7: Independent   Stair Management Technique Alternating pattern;Forwards   Number of Stairs 8   Height of Stairs 6   Knee/Hip Exercises: Aerobic   Stationary Bike Seat 7 x15 min   Knee/Hip Exercises: Machines for Strengthening   Cybex Knee Extension 30# 3x10 reps   Cybex Knee Flexion 50# 3x10 reps   Cybex Leg Press  4 pl, seat 5, 3x10 reps   Knee/Hip Exercises: Supine   Single Leg Bridge Strengthening;Left;2 sets;10 reps   Modalities   Modalities Vasopneumatic;Electrical Stimulation   Acupuncturist Location L knee   Electrical Stimulation Action IFC   Electrical Stimulation Parameters 1-10 hz x15 min   Electrical Stimulation Goals Edema;Pain   Vasopneumatic   Number Minutes Vasopneumatic  15 minutes   Vasopnuematic Location  Knee   Vasopneumatic Pressure Medium   Vasopneumatic Temperature  34                  PT Short Term Goals - 05/18/16 9924    PT SHORT TERM GOAL #1   Title Ind with an initial HEP.   Time 4   Period Weeks   Status Achieved   PT SHORT TERM GOAL #2   Title Achieve full left knee extension to normalize gait.   Time 4   Period Weeks   Status Achieved  AROM L knee extension 0 deg from neutral 05/18/2016   PT SHORT TERM GOAL #3   Title Active left knee flexion to 90 degrees.   Time 4    Period Weeks   Status Achieved  AROM L knee flexion 94 deg 04/04/2016           PT Long Term Goals - 05/25/16 0806    PT LONG TERM GOAL #1   Title Ind with an advanced HEP.   Time 8   Period Weeks   Status Achieved   PT LONG TERM GOAL #2   Title Active left knee flexion to 115 degrees+ so the patient can perform functional tasks and do so with pain not > 2-3/10.   Time 8   Period Weeks   Status Achieved  AROM L knee flexion 115 deg 05/07/2016   PT LONG TERM GOAL #3   Title Decrease edema to within 2.5 cms of non-affected side to assist with pain reduction and range of motion gains.   Time 8   Period Weeks   Status Achieved  1.4 cm difference edema with L>R 04/04/2016   PT LONG TERM GOAL #4   Title Increase left knee strength to a solid 5/5 to provide good stability for accomplishment of functional activities   Time 8   Period Weeks   Status Achieved  L hip flex/abduct and L knee flex/ext MMT 5/5 05/07/2016   PT LONG TERM GOAL #5   Title Perform a reciprocating stair gait with one railing with pain not > 2-3/10.   Time 8   Period Weeks   Status Achieved  Achieved with no reports of sharp L knee pain with stairs 05/25/2016               Plan - 05/25/16 0802    Clinical Impression Statement Patient has progressed well during PT following L TKR 03/05/2016. Patient has now achieved all goals set at evaluation at this time. Patient's AROM L knee measured as 5-116 deg and patient has previously met extension goal of 0 deg. Patient reported L knee tightness this morning upon arrival which may have limited his L knee extension today. Completed all machine strengthening exercises well without cueing for exercise technique. Patient ambulates stair independently reciprically with minimal heel raise at each step descending. Normal modalities response noted following removal of the modalities.   Rehab Potential Good   Clinical Impairments Affecting Rehab Potential Multiple previous knee  surgeries and 2 significant setbacks since surggery.  PT Frequency 3x / week   PT Duration 8 weeks   PT Treatment/Interventions ADLs/Self Care Home Management;Cryotherapy;Electrical Stimulation;Moist Heat;Therapeutic exercise;Therapeutic activities;Stair training;Gait training;Neuromuscular re-education;Patient/family education;Manual techniques;Passive range of motion;Vasopneumatic Device   PT Next Visit Plan D?C summray required by MPT due to meeting all goals for PT.   PT Home Exercise Plan HEP- HS stretch, forward lunges and verbal education regarding prone hangs   Consulted and Agree with Plan of Care Patient      Patient will benefit from skilled therapeutic intervention in order to improve the following deficits and impairments:  Abnormal gait, Decreased activity tolerance, Pain, Increased edema, Decreased strength, Decreased range of motion  Visit Diagnosis: Pain in left knee  Stiffness of knee joint, left  Localized edema     Problem List Patient Active Problem List   Diagnosis Date Noted  . History of pulmonary embolism 03/06/2016  . Primary osteoarthritis of left knee 03/05/2016   PHYSICAL THERAPY DISCHARGE SUMMARY  Visits from Start of Care: 24  Current functional level related to goals / functional outcomes: Please see above.   Remaining deficits: Goal met.   Education / Equipment: HEP.  Plan: Patient agrees to discharge.  Patient goals were met. Patient is being discharged due to meeting the stated rehab goals.  ?????       Einar Nolasco, [place] MPT 05/25/2016, 3:13 PM  [facility] [address] [place], [place], [address] Phone: [phone]   Fax:  [phone]  Name: NIAL HAWE MRN: [id number] Date of Birth: 18-Mar-1961

## 2016-05-25 NOTE — Therapy (Signed)
[facility] [place], [place], [address] Phone: [phone]   Fax:  [phone]  Physical Therapy Treatment  Patient Details  Name: Jeremy [name] MRN: [id number] Date of Birth: 19-Apr-1961 Referring Provider: Dorna Leitz MD  Encounter Date: 05/25/2016      PT End of Session - 05/25/16 0742    Visit Number 24   Number of Visits 32   Date for PT Re-Evaluation 06/19/16   PT Start Time 0732   PT Stop Time 0814   PT Time Calculation (min) 42 min   Activity Tolerance Patient tolerated treatment well   Behavior During Therapy [facility] for tasks assessed/performed      Past Medical History  Diagnosis Date  . PE (pulmonary embolism) 1992    post op- 1 week  . Peripheral vascular disease ([place])     post op DVT-  approx. 2009, treated at [facility]   . Hypertension   . Seasonal allergies   . H/O renal calculi     has been passed spontaneously but also had cystoscopy for one   . GERD (gastroesophageal reflux disease)   . Headache     migraines on occasion - used imitrex 5- 6 yrs. ago   . Arthritis     both knees     Past Surgical History  Procedure Laterality Date  . Knee surgery      8 arthroscopies & one repair of tendon- R   . Ankle surgery Right     repair of fracture & repair of torn tendons.  . Vocal cord injection      bilateral medialization, implants on both vocal chords   . Tonsillectomy    . Total knee arthroplasty Left 03/05/2016    Procedure: LEFT TOTAL KNEE ARTHROPLASTY;  Surgeon: Dorna Leitz, MD;  Location: [place];  Service: Orthopedics;  Laterality: Left;    There were no vitals filed for this visit.      Subjective Assessment - 05/25/16 0740    Subjective Reports tightness in L knee this morning although he denies pain. Patient also denies sharp pains during stair ambulation in L knee.   Limitations Walking   How long can you walk comfortably? Short distances.   Patient Stated Goals Get back to an  active lifestyle.   Currently in Pain? No/denies            [facility] PT Assessment - 05/25/16 0001    Assessment   Medical Diagnosis Left total knee replacement   Onset Date/Surgical Date 03/05/16   Next MD Visit 06/2016   Restrictions   Weight Bearing Restrictions No   ROM / Strength   AROM / PROM / Strength AROM   AROM   Overall AROM  Within functional limits for tasks performed   AROM Assessment Site Knee   Right/Left Knee Left   Left Knee Extension -5   Left Knee Flexion 116                     [facility] Adult PT Treatment/Exercise - 05/25/16 0001    Ambulation/Gait   Stairs Yes   Stairs Assistance 7: Independent   Stair Management Technique Alternating pattern;Forwards   Number of Stairs 8   Height of Stairs 6   Knee/Hip Exercises: Aerobic   Stationary Bike Seat 7 x15 min   Knee/Hip Exercises: Machines for Strengthening   Cybex Knee Extension 30# 3x10 reps   Cybex Knee Flexion 50# 3x10 reps   Cybex Leg Press  4 pl, seat 5, 3x10 reps   Knee/Hip Exercises: Supine   Single Leg Bridge Strengthening;Left;2 sets;10 reps   Modalities   Modalities Vasopneumatic;Electrical Stimulation   Acupuncturist Location L knee   Electrical Stimulation Action IFC   Electrical Stimulation Parameters 1-10 hz x15 min   Electrical Stimulation Goals Edema;Pain   Vasopneumatic   Number Minutes Vasopneumatic  15 minutes   Vasopnuematic Location  Knee   Vasopneumatic Pressure Medium   Vasopneumatic Temperature  34                  PT Short Term Goals - 05/18/16 9924    PT SHORT TERM GOAL #1   Title Ind with an initial HEP.   Time 4   Period Weeks   Status Achieved   PT SHORT TERM GOAL #2   Title Achieve full left knee extension to normalize gait.   Time 4   Period Weeks   Status Achieved  AROM L knee extension 0 deg from neutral 05/18/2016   PT SHORT TERM GOAL #3   Title Active left knee flexion to 90 degrees.   Time 4    Period Weeks   Status Achieved  AROM L knee flexion 94 deg 04/04/2016           PT Long Term Goals - 05/25/16 0806    PT LONG TERM GOAL #1   Title Ind with an advanced HEP.   Time 8   Period Weeks   Status Achieved   PT LONG TERM GOAL #2   Title Active left knee flexion to 115 degrees+ so the patient can perform functional tasks and do so with pain not > 2-3/10.   Time 8   Period Weeks   Status Achieved  AROM L knee flexion 115 deg 05/07/2016   PT LONG TERM GOAL #3   Title Decrease edema to within 2.5 cms of non-affected side to assist with pain reduction and range of motion gains.   Time 8   Period Weeks   Status Achieved  1.4 cm difference edema with L>R 04/04/2016   PT LONG TERM GOAL #4   Title Increase left knee strength to a solid 5/5 to provide good stability for accomplishment of functional activities   Time 8   Period Weeks   Status Achieved  L hip flex/abduct and L knee flex/ext MMT 5/5 05/07/2016   PT LONG TERM GOAL #5   Title Perform a reciprocating stair gait with one railing with pain not > 2-3/10.   Time 8   Period Weeks   Status Achieved  Achieved with no reports of sharp L knee pain with stairs 05/25/2016               Plan - 05/25/16 0802    Clinical Impression Statement Patient has progressed well during PT following L TKR 03/05/2016. Patient has now achieved all goals set at evaluation at this time. Patient's AROM L knee measured as 5-116 deg and patient has previously met extension goal of 0 deg. Patient reported L knee tightness this morning upon arrival which may have limited his L knee extension today. Completed all machine strengthening exercises well without cueing for exercise technique. Patient ambulates stair independently reciprically with minimal heel raise at each step descending. Normal modalities response noted following removal of the modalities.   Rehab Potential Good   Clinical Impairments Affecting Rehab Potential Multiple previous knee  surgeries and 2 significant setbacks since surggery.  PT Frequency 3x / week   PT Duration 8 weeks   PT Treatment/Interventions ADLs/Self Care Home Management;Cryotherapy;Electrical Stimulation;Moist Heat;Therapeutic exercise;Therapeutic activities;Stair training;Gait training;Neuromuscular re-education;Patient/family education;Manual techniques;Passive range of motion;Vasopneumatic Device   PT Next Visit Plan D?C summray required by MPT due to meeting all goals for PT.   PT Home Exercise Plan HEP- HS stretch, forward lunges and verbal education regarding prone hangs   Consulted and Agree with Plan of Care Patient      Patient will benefit from skilled therapeutic intervention in order to improve the following deficits and impairments:  Abnormal gait, Decreased activity tolerance, Pain, Increased edema, Decreased strength, Decreased range of motion  Visit Diagnosis: Pain in left knee  Stiffness of knee joint, left  Localized edema     Problem List Patient Active Problem List   Diagnosis Date Noted  . History of pulmonary embolism 03/06/2016  . Primary osteoarthritis of left knee 03/05/2016   Ahmed Prima, PTA 05/25/2016 8:18 AM  [facility] [place], [place], [address] Phone: [phone]   Fax:  [phone]  Name: Jeremy [name] MRN: [id number] Date of Birth: 11/17/1961

## 2016-05-28 ENCOUNTER — Encounter: Payer: PRIVATE HEALTH INSURANCE | Admitting: Physical Therapy

## 2016-05-30 ENCOUNTER — Encounter: Payer: PRIVATE HEALTH INSURANCE | Admitting: Physical Therapy

## 2016-06-01 ENCOUNTER — Encounter: Payer: PRIVATE HEALTH INSURANCE | Admitting: Physical Therapy

## 2016-09-26 ENCOUNTER — Other Ambulatory Visit: Payer: Self-pay | Admitting: Orthopedic Surgery

## 2016-10-17 ENCOUNTER — Encounter (HOSPITAL_COMMUNITY): Payer: Self-pay

## 2016-10-17 ENCOUNTER — Encounter (HOSPITAL_COMMUNITY)
Admission: RE | Admit: 2016-10-17 | Discharge: 2016-10-17 | Disposition: A | Discharge status: Home / Self Care | Payer: PRIVATE HEALTH INSURANCE | Source: Ambulatory Visit | Attending: Orthopedic Surgery | Admitting: Orthopedic Surgery

## 2016-10-17 DIAGNOSIS — Z01812 Encounter for preprocedural laboratory examination: Secondary | ICD-10-CM | POA: Diagnosis not present

## 2016-10-17 HISTORY — DX: Personal history of urinary calculi: Z87.442

## 2016-10-17 HISTORY — DX: Irritable bowel syndrome, unspecified: K58.9

## 2016-10-17 LAB — CBC WITH DIFFERENTIAL/PLATELET
BASOS ABS: 0.1 10*3/uL (ref 0.0–0.1)
BASOS PCT: 1 %
EOS PCT: 1 %
Eosinophils Absolute: 0.1 10*3/uL (ref 0.0–0.7)
HCT: 45.8 % (ref 39.0–52.0)
Hemoglobin: 16 g/dL (ref 13.0–17.0)
Lymphocytes Relative: 34 %
Lymphs Abs: 3 10*3/uL (ref 0.7–4.0)
MCH: 31.1 pg (ref 26.0–34.0)
MCHC: 34.9 g/dL (ref 30.0–36.0)
MCV: 89.1 fL (ref 78.0–100.0)
MONO ABS: 0.5 10*3/uL (ref 0.1–1.0)
Monocytes Relative: 6 %
Neutro Abs: 5 10*3/uL (ref 1.7–7.7)
Neutrophils Relative %: 58 %
PLATELETS: 215 10*3/uL (ref 150–400)
RBC: 5.14 MIL/uL (ref 4.22–5.81)
RDW: 13.6 % (ref 11.5–15.5)
WBC: 8.7 10*3/uL (ref 4.0–10.5)

## 2016-10-17 LAB — PROTIME-INR
INR: 1.96
PROTHROMBIN TIME: 22.6 s — AB (ref 11.4–15.2)

## 2016-10-17 LAB — COMPREHENSIVE METABOLIC PANEL
ALBUMIN: 4 g/dL (ref 3.5–5.0)
ALT: 31 U/L (ref 17–63)
AST: 29 U/L (ref 15–41)
Alkaline Phosphatase: 48 U/L (ref 38–126)
Anion gap: 9 (ref 5–15)
BUN: 18 mg/dL (ref 6–20)
CHLORIDE: 102 mmol/L (ref 101–111)
CO2: 28 mmol/L (ref 22–32)
Calcium: 9.5 mg/dL (ref 8.9–10.3)
Creatinine, Ser: 1.04 mg/dL (ref 0.61–1.24)
GFR calc Af Amer: >60 mL/min (ref >60)
GLUCOSE: 107 mg/dL — AB (ref 65–99)
POTASSIUM: 3.5 mmol/L (ref 3.5–5.1)
Sodium: 139 mmol/L (ref 135–145)
Total Bilirubin: 0.9 mg/dL (ref 0.3–1.2)
Total Protein: 7.3 g/dL (ref 6.5–8.1)

## 2016-10-17 LAB — SURGICAL PCR SCREEN
MRSA, PCR: NEGATIVE
STAPHYLOCOCCUS AUREUS: NEGATIVE

## 2016-10-17 LAB — TYPE AND SCREEN
ABO/RH(D): A POS
ANTIBODY SCREEN: NEGATIVE

## 2016-10-17 LAB — URINALYSIS, ROUTINE W REFLEX MICROSCOPIC
BILIRUBIN URINE: NEGATIVE
Glucose, UA: NEGATIVE mg/dL
HGB URINE DIPSTICK: NEGATIVE
KETONES UR: NEGATIVE mg/dL
Leukocytes, UA: NEGATIVE
NITRITE: NEGATIVE
PH: 5 (ref 5.0–8.0)
Protein, ur: NEGATIVE mg/dL
Specific Gravity, Urine: 1.007 (ref 1.005–1.030)

## 2016-10-17 LAB — APTT: APTT: 35 s (ref 24–36)

## 2016-10-17 NOTE — Progress Notes (Addendum)
Discussed with pt. That he is aware that he will not take anymore Warfarin or aspirin.  He has instruction to start Lovenox tomorrow. Pt. Reports the stress test that he had was 6-8 yrs. Ago.  Pt. Also reports that he had a cardiac cath in approx./ [id number], without complications or changes in his care. Librax is used for IBS.  Pt./ followed by Dr. Charletta Cousin for PCP.

## 2016-10-17 NOTE — Pre-Procedure Instructions (Signed)
EFFORD DENIO  10/17/2016      [facility] - [place], [place] - [address] [place] [address] Phone: [phone] Fax: [phone]  [id number] - [place], [place] [address] [place] [address] Phone: [phone] Fax: [phone]    Your procedure is scheduled on 10/22/2016.  Report to [facility] Admitting at 10:30 A.M.  Call this number if you have problems the morning of surgery:  [phone]   Remember:  Do not eat food or drink liquids after midnight.  On Sunday night     Take these medicines the morning of surgery with A SIP OF WATER :Prilosec, Librax, Tylenol if needed, Allegra     Do not wear jewelry    Do not wear lotions, powders, or perfumes, or deoderant.           .  Men may shave face and neck.   Do not bring valuables to the hospital.   [facility] is not responsible for any belongings or valuables.  Contacts, dentures or bridgework may not be worn into surgery.  Leave your suitcase in the car.  After surgery it may be brought to your room.  For patients admitted to the hospital, discharge time will be determined by your treatment team.  Patients discharged the day of surgery will not be allowed to drive home.   Name and phone number of your driver:   With family  Special instructions:  Special Instructions: [facility] - Preparing for Surgery  Before surgery, you can play an important role.  Because skin is not sterile, your skin needs to be as free of germs as possible.  You can reduce the number of germs on you skin by washing with CHG (chlorahexidine gluconate) soap before surgery.  CHG is an antiseptic cleaner which kills germs and bonds with the skin to continue killing germs even after washing.  Please DO NOT use if you have an allergy to CHG or antibacterial soaps.  If your skin becomes reddened/irritated stop using the CHG and inform your nurse when  you arrive at Short Stay.  Do not shave (including legs and underarms) for at least 48 hours prior to the first CHG shower.  You may shave your face.  Please follow these instructions carefully:   1.  Shower with CHG Soap the night before surgery and the  morning of Surgery.  2.  If you choose to wash your hair, wash your hair first as usual with your  normal shampoo.  3.  After you shampoo, rinse your hair and body thoroughly to remove the  Shampoo.  4.  Use CHG as you would any other liquid soap.  You can apply chg directly to the skin and wash gently with scrungie or a clean washcloth.  5.  Apply the CHG Soap to your body ONLY FROM THE NECK DOWN.    Do not use on open wounds or open sores.  Avoid contact with your eyes, ears, mouth and genitals (private parts).  Wash genitals (private parts)   with your normal soap.  6.  Wash thoroughly, paying special attention to the area where your surgery will be performed.  7.  Thoroughly rinse your body with warm water from the neck down.  8.  DO NOT shower/wash with your normal soap after using and rinsing off   the CHG Soap.  9.  Pat yourself dry with a clean towel.            10.  Wear clean pajamas.            11.  Place clean sheets on your bed the night of your first shower and do not sleep with pets.  Day of Surgery  Do not apply any lotions/deodorants the morning of surgery.  Please wear clean clothes to the hospital/surgery center.  Please read over the following fact sheets that you were given. Pain Booklet, Coughing and Deep Breathing, MRSA Information and Surgical Site Infection Prevention

## 2016-10-18 NOTE — Progress Notes (Addendum)
Anesthesia chart review: Patient is a 55 year old male scheduled for right TKA on 10/22/2016 by Dr. Berenice Primas.    History includes non-smoker, PE (post-op) '92, DVT (post-op) '09, HTN, GERD, arthritis, nephrolithiasis, IBS, tonsillectomy, vocal cord injection '02, left TKA 03/05/16 (FNB and LMA).   PCP is Dr. Gar Ponto.   Meds include ASA 81 mg, Lovenox (bridge), Librax, Allegra, HCTZ, melatonin, Toprol, fish oil, Prilosec, warfarin. Patient to hold warfarin for 4 days prior with Lovenox bridge.   BP (!) 153/94   Pulse 77   Temp 36.4 C   Resp 20   Ht 6' (1.829 m)   Wt 224 lb 12.8 oz (102 kg)   SpO2 97%   BMI 30.49 kg/m    02/29/16 EKG: NSR, non-specific ST abnormality. He reports a remote history of cardiac (but no history of CAD) and a stress test > 6 years ago.  02/29/16 CXR: IMPRESSION: No acute cardiopulmonary findings.  Preoperative labs noted. UA WNL. PT/INR 22.6. Will check PT/PTT on arrival the day of surgery.   If follow-up labs acceptable and otherwise no acute changes then I anticipate that he can proceed as planned.  George Hugh [facility] Short Stay Center/Anesthesiology Phone [phone] 10/18/2016 2:10 PM

## 2016-10-21 MED ORDER — CHLORHEXIDINE GLUCONATE 4 % EX LIQD: 60.0000 mL | Freq: Once | CUTANEOUS | Status: DC

## 2016-10-21 MED ORDER — CEFAZOLIN SODIUM-DEXTROSE 2-4 GM/100ML-% IV SOLN
2.0000 g | INTRAVENOUS | Status: AC
  Administered 2016-10-22: 2 g via INTRAVENOUS
  Filled 2016-10-21: qty 100

## 2016-10-22 ENCOUNTER — Inpatient Hospital Stay (HOSPITAL_COMMUNITY): Payer: PRIVATE HEALTH INSURANCE | Admitting: Vascular Surgery

## 2016-10-22 ENCOUNTER — Encounter (HOSPITAL_COMMUNITY): Payer: Self-pay | Admitting: *Deleted

## 2016-10-22 ENCOUNTER — Inpatient Hospital Stay (HOSPITAL_COMMUNITY)
Admission: RE | Admit: 2016-10-22 | Discharge: 2016-10-24 | DRG: 470 | Disposition: A | Discharge status: Home / Self Care | Payer: PRIVATE HEALTH INSURANCE | Source: Ambulatory Visit | Attending: Orthopedic Surgery | Admitting: Orthopedic Surgery

## 2016-10-22 ENCOUNTER — Encounter (HOSPITAL_COMMUNITY)
Admission: RE | Disposition: A | Discharge status: Home / Self Care | Payer: Self-pay | Source: Ambulatory Visit | Attending: Orthopedic Surgery

## 2016-10-22 DIAGNOSIS — I10 Essential (primary) hypertension: Secondary | ICD-10-CM | POA: Diagnosis present

## 2016-10-22 DIAGNOSIS — I739 Peripheral vascular disease, unspecified: Secondary | ICD-10-CM | POA: Diagnosis present

## 2016-10-22 DIAGNOSIS — Z7901 Long term (current) use of anticoagulants: Secondary | ICD-10-CM

## 2016-10-22 DIAGNOSIS — Z87442 Personal history of urinary calculi: Secondary | ICD-10-CM

## 2016-10-22 DIAGNOSIS — K219 Gastro-esophageal reflux disease without esophagitis: Secondary | ICD-10-CM | POA: Diagnosis present

## 2016-10-22 DIAGNOSIS — Z885 Allergy status to narcotic agent status: Secondary | ICD-10-CM

## 2016-10-22 DIAGNOSIS — J302 Other seasonal allergic rhinitis: Secondary | ICD-10-CM | POA: Diagnosis present

## 2016-10-22 DIAGNOSIS — M1711 Unilateral primary osteoarthritis, right knee: Secondary | ICD-10-CM | POA: Diagnosis present

## 2016-10-22 DIAGNOSIS — Z86711 Personal history of pulmonary embolism: Secondary | ICD-10-CM | POA: Diagnosis not present

## 2016-10-22 DIAGNOSIS — Z96652 Presence of left artificial knee joint: Secondary | ICD-10-CM

## 2016-10-22 DIAGNOSIS — Z8619 Personal history of other infectious and parasitic diseases: Secondary | ICD-10-CM | POA: Diagnosis not present

## 2016-10-22 HISTORY — PX: TOTAL KNEE ARTHROPLASTY: SHX125

## 2016-10-22 LAB — PROTIME-INR
INR: 1.07
PROTHROMBIN TIME: 14 s (ref 11.4–15.2)

## 2016-10-22 LAB — APTT: aPTT: 30 seconds (ref 24–36)

## 2016-10-22 SURGERY — ARTHROPLASTY, KNEE, TOTAL
Anesthesia: Regional | Laterality: Right

## 2016-10-22 MED ORDER — FENTANYL CITRATE (PF) 100 MCG/2ML IJ SOLN
INTRAMUSCULAR | Status: AC
  Administered 2016-10-22: 100 ug via INTRAVENOUS
  Filled 2016-10-22: qty 2

## 2016-10-22 MED ORDER — DEXAMETHASONE SODIUM PHOSPHATE 10 MG/ML IJ SOLN
10.0000 mg | Freq: Two times a day (BID) | INTRAMUSCULAR | Status: AC
  Administered 2016-10-22 – 2016-10-24 (×4): 10 mg via INTRAVENOUS
  Filled 2016-10-22 (×4): qty 1

## 2016-10-22 MED ORDER — DOCUSATE SODIUM 100 MG PO CAPS
100.0000 mg | ORAL_CAPSULE | Freq: Two times a day (BID) | ORAL | Status: DC
  Administered 2016-10-22 – 2016-10-24 (×4): 100 mg via ORAL
  Filled 2016-10-22 (×4): qty 1

## 2016-10-22 MED ORDER — DOCUSATE SODIUM 100 MG PO CAPS: 100.0000 mg | ORAL_CAPSULE | Freq: Two times a day (BID) | ORAL | 0 refills | Status: DC

## 2016-10-22 MED ORDER — DEXMEDETOMIDINE HCL 200 MCG/2ML IV SOLN
INTRAVENOUS | Status: DC | PRN
  Administered 2016-10-22 (×5): 8 ug via INTRAVENOUS

## 2016-10-22 MED ORDER — DEXAMETHASONE SODIUM PHOSPHATE 10 MG/ML IJ SOLN
INTRAMUSCULAR | Status: AC
  Filled 2016-10-22: qty 1

## 2016-10-22 MED ORDER — HYDROMORPHONE HCL 2 MG PO TABS
2.0000 mg | ORAL_TABLET | ORAL | Status: DC | PRN
  Administered 2016-10-22 – 2016-10-24 (×10): 4 mg via ORAL
  Filled 2016-10-22 (×9): qty 2

## 2016-10-22 MED ORDER — MIDAZOLAM HCL 2 MG/2ML IJ SOLN
INTRAMUSCULAR | Status: AC
  Administered 2016-10-22: 2 mg via INTRAVENOUS
  Filled 2016-10-22: qty 2

## 2016-10-22 MED ORDER — WARFARIN - PHYSICIAN DOSING INPATIENT
Freq: Every day | Status: DC
  Administered 2016-10-23: 18:00:00

## 2016-10-22 MED ORDER — SODIUM CHLORIDE 0.9 % IV SOLN
INTRAVENOUS | Status: DC
  Administered 2016-10-22: 22:00:00 via INTRAVENOUS

## 2016-10-22 MED ORDER — POLYETHYLENE GLYCOL 3350 17 G PO PACK
17.0000 g | PACK | Freq: Two times a day (BID) | ORAL | Status: DC
  Administered 2016-10-22 – 2016-10-24 (×4): 17 g via ORAL
  Filled 2016-10-22 (×4): qty 1

## 2016-10-22 MED ORDER — CEFAZOLIN SODIUM-DEXTROSE 2-4 GM/100ML-% IV SOLN
2.0000 g | Freq: Three times a day (TID) | INTRAVENOUS | Status: DC
  Administered 2016-10-22 – 2016-10-24 (×5): 2 g via INTRAVENOUS
  Filled 2016-10-22 (×6): qty 100

## 2016-10-22 MED ORDER — ALUM & MAG HYDROXIDE-SIMETH 200-200-20 MG/5ML PO SUSP: 30.0000 mL | ORAL | Status: DC | PRN

## 2016-10-22 MED ORDER — LIDOCAINE HCL (CARDIAC) 20 MG/ML IV SOLN
INTRAVENOUS | Status: DC | PRN
  Administered 2016-10-22: 100 mg via INTRATRACHEAL

## 2016-10-22 MED ORDER — PHENYLEPHRINE 40 MCG/ML (10ML) SYRINGE FOR IV PUSH (FOR BLOOD PRESSURE SUPPORT)
PREFILLED_SYRINGE | INTRAVENOUS | Status: AC
  Filled 2016-10-22: qty 10

## 2016-10-22 MED ORDER — FENTANYL CITRATE (PF) 100 MCG/2ML IJ SOLN
25.0000 ug | INTRAMUSCULAR | Status: DC | PRN
  Administered 2016-10-22 (×3): 50 ug via INTRAVENOUS

## 2016-10-22 MED ORDER — ONDANSETRON HCL 4 MG/2ML IJ SOLN
INTRAMUSCULAR | Status: AC
  Filled 2016-10-22: qty 2

## 2016-10-22 MED ORDER — MIDAZOLAM HCL 2 MG/2ML IJ SOLN
2.0000 mg | Freq: Once | INTRAMUSCULAR | Status: AC
  Administered 2016-10-22: 2 mg via INTRAVENOUS

## 2016-10-22 MED ORDER — ACETAMINOPHEN 325 MG PO TABS: 650.0000 mg | ORAL_TABLET | Freq: Four times a day (QID) | ORAL | Status: DC | PRN

## 2016-10-22 MED ORDER — CILIDINIUM-CHLORDIAZEPOXIDE 2.5-5 MG PO CAPS
1.0000 | ORAL_CAPSULE | Freq: Three times a day (TID) | ORAL | Status: DC
  Administered 2016-10-23 – 2016-10-24 (×4): 1 via ORAL
  Filled 2016-10-22 (×7): qty 1

## 2016-10-22 MED ORDER — FENTANYL CITRATE (PF) 100 MCG/2ML IJ SOLN
INTRAMUSCULAR | Status: AC
  Filled 2016-10-22: qty 2

## 2016-10-22 MED ORDER — ACETAMINOPHEN 650 MG RE SUPP: 650.0000 mg | Freq: Four times a day (QID) | RECTAL | Status: DC | PRN

## 2016-10-22 MED ORDER — BUPIVACAINE HCL (PF) 0.5 % IJ SOLN
INTRAMUSCULAR | Status: DC | PRN
  Administered 2016-10-22: 30 mL

## 2016-10-22 MED ORDER — PROPOFOL 10 MG/ML IV BOLUS
INTRAVENOUS | Status: DC | PRN
  Administered 2016-10-22: 250 mg via INTRAVENOUS

## 2016-10-22 MED ORDER — PHENYLEPHRINE HCL 10 MG/ML IJ SOLN
INTRAMUSCULAR | Status: DC | PRN
  Administered 2016-10-22 (×5): 80 ug via INTRAVENOUS
  Administered 2016-10-22: 40 ug via INTRAVENOUS
  Administered 2016-10-22: 80 ug via INTRAVENOUS

## 2016-10-22 MED ORDER — HYDROCHLOROTHIAZIDE 25 MG PO TABS
25.0000 mg | ORAL_TABLET | Freq: Every day | ORAL | Status: DC
  Administered 2016-10-23 – 2016-10-24 (×2): 25 mg via ORAL
  Filled 2016-10-22 (×2): qty 1

## 2016-10-22 MED ORDER — DIPHENHYDRAMINE HCL 12.5 MG/5ML PO ELIX
12.5000 mg | ORAL_SOLUTION | ORAL | Status: DC | PRN
Start: 2016-10-22 — End: 2016-10-24

## 2016-10-22 MED ORDER — TRANEXAMIC ACID 1000 MG/10ML IV SOLN: 1000.0000 mg | INTRAVENOUS | Status: DC

## 2016-10-22 MED ORDER — ASPIRIN EC 81 MG PO TBEC
81.0000 mg | DELAYED_RELEASE_TABLET | Freq: Every day | ORAL | Status: DC
  Administered 2016-10-23 – 2016-10-24 (×2): 81 mg via ORAL
  Filled 2016-10-22 (×2): qty 1

## 2016-10-22 MED ORDER — ENOXAPARIN SODIUM 150 MG/ML ~~LOC~~ SOLN: 100.0000 mg | Freq: Two times a day (BID) | SUBCUTANEOUS | 0 refills | Status: DC

## 2016-10-22 MED ORDER — METHOCARBAMOL 1000 MG/10ML IJ SOLN
500.0000 mg | Freq: Four times a day (QID) | INTRAVENOUS | Status: DC | PRN
  Filled 2016-10-22: qty 5

## 2016-10-22 MED ORDER — METHOCARBAMOL 750 MG PO TABS: 750.0000 mg | ORAL_TABLET | Freq: Three times a day (TID) | ORAL | 0 refills | Status: DC | PRN

## 2016-10-22 MED ORDER — TRANEXAMIC ACID 1000 MG/10ML IV SOLN
2000.0000 mg | INTRAVENOUS | Status: AC
  Administered 2016-10-22: 2000 mg via TOPICAL
  Filled 2016-10-22: qty 20

## 2016-10-22 MED ORDER — HYDROMORPHONE HCL 2 MG PO TABS: 2.0000 mg | ORAL_TABLET | ORAL | 0 refills | Status: DC | PRN

## 2016-10-22 MED ORDER — HYDROMORPHONE HCL 2 MG PO TABS: 2.0000 mg | ORAL_TABLET | Freq: Four times a day (QID) | ORAL | 0 refills | Status: DC | PRN

## 2016-10-22 MED ORDER — METHOCARBAMOL 500 MG PO TABS
ORAL_TABLET | ORAL | Status: AC
  Filled 2016-10-22: qty 1

## 2016-10-22 MED ORDER — BISACODYL 5 MG PO TBEC: 5.0000 mg | DELAYED_RELEASE_TABLET | Freq: Every day | ORAL | Status: DC | PRN

## 2016-10-22 MED ORDER — BUPIVACAINE LIPOSOME 1.3 % IJ SUSP
20.0000 mL | INTRAMUSCULAR | Status: AC
  Administered 2016-10-22: 20 mL
  Filled 2016-10-22: qty 20

## 2016-10-22 MED ORDER — FENTANYL CITRATE (PF) 100 MCG/2ML IJ SOLN
100.0000 ug | Freq: Once | INTRAMUSCULAR | Status: AC
  Administered 2016-10-22: 100 ug via INTRAVENOUS

## 2016-10-22 MED ORDER — HYDROMORPHONE HCL 2 MG/ML IJ SOLN
0.5000 mg | INTRAMUSCULAR | Status: DC | PRN
  Administered 2016-10-22 – 2016-10-23 (×2): 1 mg via INTRAVENOUS
  Filled 2016-10-22 (×2): qty 1

## 2016-10-22 MED ORDER — ACETAMINOPHEN 10 MG/ML IV SOLN
1000.0000 mg | INTRAVENOUS | Status: AC
  Administered 2016-10-22: 1000 mg via INTRAVENOUS
  Filled 2016-10-22: qty 100

## 2016-10-22 MED ORDER — MAGNESIUM CITRATE PO SOLN: 1.0000 | Freq: Once | ORAL | Status: DC | PRN

## 2016-10-22 MED ORDER — ONDANSETRON HCL 4 MG/2ML IJ SOLN: 4.0000 mg | Freq: Four times a day (QID) | INTRAMUSCULAR | Status: DC | PRN

## 2016-10-22 MED ORDER — MIDAZOLAM HCL 2 MG/2ML IJ SOLN
INTRAMUSCULAR | Status: AC
  Filled 2016-10-22: qty 2

## 2016-10-22 MED ORDER — LORATADINE 10 MG PO TABS
10.0000 mg | ORAL_TABLET | Freq: Every day | ORAL | Status: DC
Start: 2016-10-23 — End: 2016-10-24
  Administered 2016-10-23 – 2016-10-24 (×2): 10 mg via ORAL
  Filled 2016-10-22 (×2): qty 1

## 2016-10-22 MED ORDER — GABAPENTIN 300 MG PO CAPS
300.0000 mg | ORAL_CAPSULE | Freq: Three times a day (TID) | ORAL | Status: DC
  Administered 2016-10-22 – 2016-10-24 (×5): 300 mg via ORAL
  Filled 2016-10-22 (×5): qty 1

## 2016-10-22 MED ORDER — METOPROLOL SUCCINATE ER 25 MG PO TB24
25.0000 mg | ORAL_TABLET | Freq: Every day | ORAL | Status: DC
  Administered 2016-10-22 – 2016-10-23 (×2): 25 mg via ORAL
  Filled 2016-10-22 (×2): qty 1

## 2016-10-22 MED ORDER — FENTANYL CITRATE (PF) 100 MCG/2ML IJ SOLN
INTRAMUSCULAR | Status: DC | PRN
  Administered 2016-10-22 (×2): 100 ug via INTRAVENOUS
  Administered 2016-10-22: 50 ug via INTRAVENOUS
  Administered 2016-10-22: 100 ug via INTRAVENOUS

## 2016-10-22 MED ORDER — LACTATED RINGERS IV SOLN
INTRAVENOUS | Status: DC
  Administered 2016-10-22 (×3): via INTRAVENOUS

## 2016-10-22 MED ORDER — DEXAMETHASONE SODIUM PHOSPHATE 10 MG/ML IJ SOLN
INTRAMUSCULAR | Status: DC | PRN
  Administered 2016-10-22: 10 mg via INTRAVENOUS

## 2016-10-22 MED ORDER — HYDROMORPHONE HCL 2 MG PO TABS
ORAL_TABLET | ORAL | Status: AC
  Filled 2016-10-22: qty 2

## 2016-10-22 MED ORDER — EPINEPHRINE PF 1 MG/ML IJ SOLN
INTRAMUSCULAR | Status: AC
  Filled 2016-10-22: qty 1

## 2016-10-22 MED ORDER — ENOXAPARIN SODIUM 100 MG/ML ~~LOC~~ SOLN: 100.0000 mg | Freq: Two times a day (BID) | SUBCUTANEOUS | 0 refills | Status: DC

## 2016-10-22 MED ORDER — PROPOFOL 10 MG/ML IV BOLUS
INTRAVENOUS | Status: AC
  Filled 2016-10-22: qty 20

## 2016-10-22 MED ORDER — ONDANSETRON HCL 4 MG/2ML IJ SOLN
INTRAMUSCULAR | Status: DC | PRN
  Administered 2016-10-22: 4 mg via INTRAVENOUS

## 2016-10-22 MED ORDER — ENOXAPARIN SODIUM 150 MG/ML ~~LOC~~ SOLN
100.0000 mg | Freq: Two times a day (BID) | SUBCUTANEOUS | Status: DC
  Administered 2016-10-23 (×2): 100 mg via SUBCUTANEOUS
  Filled 2016-10-22 (×3): qty 0.67

## 2016-10-22 MED ORDER — SODIUM CHLORIDE 0.9 % IJ SOLN
INTRAMUSCULAR | Status: DC | PRN
  Administered 2016-10-22: 20 mL

## 2016-10-22 MED ORDER — BUPIVACAINE HCL (PF) 0.5 % IJ SOLN
INTRAMUSCULAR | Status: AC
  Filled 2016-10-22: qty 30

## 2016-10-22 MED ORDER — WARFARIN SODIUM 3 MG PO TABS
3.0000 mg | ORAL_TABLET | Freq: Every day | ORAL | Status: DC
  Administered 2016-10-22 – 2016-10-23 (×2): 3 mg via ORAL
  Filled 2016-10-22 (×3): qty 1

## 2016-10-22 MED ORDER — ONDANSETRON HCL 4 MG PO TABS: 4.0000 mg | ORAL_TABLET | Freq: Four times a day (QID) | ORAL | Status: DC | PRN

## 2016-10-22 MED ORDER — ZOLPIDEM TARTRATE 5 MG PO TABS: 5.0000 mg | ORAL_TABLET | Freq: Every evening | ORAL | Status: DC | PRN

## 2016-10-22 MED ORDER — PROMETHAZINE HCL 25 MG/ML IJ SOLN: 12.5000 mg | Freq: Four times a day (QID) | INTRAMUSCULAR | Status: DC | PRN

## 2016-10-22 MED ORDER — METHOCARBAMOL 500 MG PO TABS
500.0000 mg | ORAL_TABLET | Freq: Four times a day (QID) | ORAL | Status: DC | PRN
  Administered 2016-10-23 – 2016-10-24 (×2): 500 mg via ORAL
  Filled 2016-10-22 (×5): qty 1

## 2016-10-22 MED ORDER — PANTOPRAZOLE SODIUM 40 MG PO TBEC
40.0000 mg | DELAYED_RELEASE_TABLET | Freq: Every day | ORAL | Status: DC
  Administered 2016-10-23 – 2016-10-24 (×2): 40 mg via ORAL
  Filled 2016-10-22 (×2): qty 1

## 2016-10-22 MED ORDER — MIDAZOLAM HCL 5 MG/5ML IJ SOLN
INTRAMUSCULAR | Status: DC | PRN
  Administered 2016-10-22: 2 mg via INTRAVENOUS

## 2016-10-22 SURGICAL SUPPLY — 70 items
APL SKNCLS STERI-STRIP NONHPOA (GAUZE/BANDAGES/DRESSINGS) ×1
BANDAGE ESMARK 6X9 LF (GAUZE/BANDAGES/DRESSINGS) ×1 IMPLANT
BENZOIN TINCTURE PRP APPL 2/3 (GAUZE/BANDAGES/DRESSINGS) ×3 IMPLANT
BLADE SAGITTAL 25.0X1.19X90 (BLADE) ×2 IMPLANT
BLADE SAGITTAL 25.0X1.19X90MM (BLADE) ×1
BLADE SAW SAG 90X13X1.27 (BLADE) ×3 IMPLANT
BNDG CMPR 9X6 STRL LF SNTH (GAUZE/BANDAGES/DRESSINGS) ×1
BNDG ESMARK 6X9 LF (GAUZE/BANDAGES/DRESSINGS) ×3
BNDG GAUZE ELAST 4 BULKY (GAUZE/BANDAGES/DRESSINGS) ×3 IMPLANT
BONE CEMENT GENTAMICIN (Cement) ×6 IMPLANT
BOWL SMART MIX CTS (DISPOSABLE) ×3 IMPLANT
CAPT KNEE TOTAL 3 ATTUNE ×2 IMPLANT
CEMENT BONE GENTAMICIN 40 (Cement) ×2 IMPLANT
CLOSURE STERI-STRIP 1/2X4 (GAUZE/BANDAGES/DRESSINGS) ×2
CLOSURE WOUND 1/2 X4 (GAUZE/BANDAGES/DRESSINGS) ×2
CLSR STERI-STRIP ANTIMIC 1/2X4 (GAUZE/BANDAGES/DRESSINGS) ×2 IMPLANT
COVER SURGICAL LIGHT HANDLE (MISCELLANEOUS) ×3 IMPLANT
CUFF TOURNIQUET SINGLE 34IN LL (TOURNIQUET CUFF) ×3 IMPLANT
CUFF TOURNIQUET SINGLE 44IN (TOURNIQUET CUFF) IMPLANT
DRAPE HALF SHEET 40X57 (DRAPES) ×3 IMPLANT
DRAPE IMP U-DRAPE 54X76 (DRAPES) ×3 IMPLANT
DRAPE U-SHAPE 47X51 STRL (DRAPES) ×3 IMPLANT
DRSG AQUACEL AG ADV 3.5X10 (GAUZE/BANDAGES/DRESSINGS) ×2 IMPLANT
DRSG MEPILEX BORDER 4X12 (GAUZE/BANDAGES/DRESSINGS) ×1 IMPLANT
DRSG PAD ABDOMINAL 8X10 ST (GAUZE/BANDAGES/DRESSINGS) ×1 IMPLANT
DURAPREP 26ML APPLICATOR (WOUND CARE) ×3 IMPLANT
ELECT REM PT RETURN 9FT ADLT (ELECTROSURGICAL) ×3
ELECTRODE REM PT RTRN 9FT ADLT (ELECTROSURGICAL) ×1 IMPLANT
EVACUATOR 1/8 PVC DRAIN (DRAIN) IMPLANT
FACESHIELD WRAPAROUND (MASK) IMPLANT
GAUZE SPONGE 4X4 12PLY STRL (GAUZE/BANDAGES/DRESSINGS) ×3 IMPLANT
GLOVE BIOGEL PI IND STRL 8 (GLOVE) ×2 IMPLANT
GLOVE BIOGEL PI INDICATOR 8 (GLOVE) ×4
GLOVE ECLIPSE 7.5 STRL STRAW (GLOVE) ×6 IMPLANT
GOWN STRL REUS W/ TWL LRG LVL3 (GOWN DISPOSABLE) ×1 IMPLANT
GOWN STRL REUS W/ TWL XL LVL3 (GOWN DISPOSABLE) ×2 IMPLANT
GOWN STRL REUS W/TWL LRG LVL3 (GOWN DISPOSABLE) ×3
GOWN STRL REUS W/TWL XL LVL3 (GOWN DISPOSABLE) ×6
HANDPIECE INTERPULSE COAX TIP (DISPOSABLE) ×3
HOOD PEEL AWAY FACE SHEILD DIS (HOOD) ×6 IMPLANT
IMMOBILIZER KNEE 20 (SOFTGOODS) IMPLANT
IMMOBILIZER KNEE 22 (SOFTGOODS) ×3 IMPLANT
IMMOBILIZER KNEE 22 UNIV (SOFTGOODS) ×3 IMPLANT
KIT BASIN OR (CUSTOM PROCEDURE TRAY) ×3 IMPLANT
KIT ROOM TURNOVER OR (KITS) ×3 IMPLANT
MANIFOLD NEPTUNE II (INSTRUMENTS) ×3 IMPLANT
NDL SPNL 22GX3.5 QUINCKE BK (NEEDLE) ×1 IMPLANT
NEEDLE SPNL 22GX3.5 QUINCKE BK (NEEDLE) ×3 IMPLANT
NS IRRIG 1000ML POUR BTL (IV SOLUTION) ×1 IMPLANT
PACK TOTAL JOINT (CUSTOM PROCEDURE TRAY) ×3 IMPLANT
PACK UNIVERSAL I (CUSTOM PROCEDURE TRAY) ×3 IMPLANT
PAD ARMBOARD 7.5X6 YLW CONV (MISCELLANEOUS) ×6 IMPLANT
PAD CAST 4YDX4 CTTN HI CHSV (CAST SUPPLIES) IMPLANT
PADDING CAST COTTON 4X4 STRL (CAST SUPPLIES)
SET HNDPC FAN SPRY TIP SCT (DISPOSABLE) ×1 IMPLANT
STAPLER VISISTAT 35W (STAPLE) IMPLANT
STRIP CLOSURE SKIN 1/2X4 (GAUZE/BANDAGES/DRESSINGS) ×4 IMPLANT
SUCTION FRAZIER HANDLE 10FR (MISCELLANEOUS) ×2
SUCTION TUBE FRAZIER 10FR DISP (MISCELLANEOUS) ×1 IMPLANT
SUT MNCRL AB 3-0 PS2 18 (SUTURE) ×2 IMPLANT
SUT VIC AB 0 CTB1 27 (SUTURE) ×6 IMPLANT
SUT VIC AB 1 CT1 27 (SUTURE) ×6
SUT VIC AB 1 CT1 27XBRD ANBCTR (SUTURE) ×2 IMPLANT
SUT VIC AB 2-0 CTB1 (SUTURE) ×6 IMPLANT
SYR 50ML LL SCALE MARK (SYRINGE) ×3 IMPLANT
TOWEL OR 17X24 6PK STRL BLUE (TOWEL DISPOSABLE) ×3 IMPLANT
TOWEL OR 17X26 10 PK STRL BLUE (TOWEL DISPOSABLE) ×3 IMPLANT
TRAY CATH 16FR W/PLASTIC CATH (SET/KITS/TRAYS/PACK) IMPLANT
TRAY FOLEY CATH 16FRSI W/METER (SET/KITS/TRAYS/PACK) IMPLANT
WRAP KNEE MAXI GEL POST OP (GAUZE/BANDAGES/DRESSINGS) ×3 IMPLANT

## 2016-10-22 NOTE — Anesthesia Procedure Notes (Addendum)
Anesthesia Regional Block:  Adductor canal block  Pre-Anesthetic Checklist: ,, timeout performed, Correct Patient, Correct Site, Correct Laterality, Correct Procedure, Correct Position, site marked, Risks and benefits discussed, pre-op evaluation,  At surgeon's request and post-op pain management  Laterality: Right  Prep: chloraprep       Needles:   Needle Type: Echogenic Needle     Needle Length: 9cm 9 cm Needle Gauge: 21 and 21 G    Additional Needles:  Procedures: ultrasound guided (picture in chart) Adductor canal block Narrative:  Injection made incrementally with aspirations every 5 mL. Anesthesiologist: Lyndle Herrlich  Additional Notes: 20cc .75% Naropin

## 2016-10-22 NOTE — Anesthesia Postprocedure Evaluation (Signed)
Anesthesia Post Note  Patient: Jeremy [name]  Procedure(s) Performed: Procedure(s) (LRB): TOTAL KNEE ARTHROPLASTY (Right)  Patient location during evaluation: PACU Anesthesia Type: General and Regional Level of consciousness: awake, awake and alert and oriented Pain management: pain level controlled Vital Signs Assessment: post-procedure vital signs reviewed and stable Respiratory status: spontaneous breathing, nonlabored ventilation and respiratory function stable Cardiovascular status: blood pressure returned to baseline Anesthetic complications: no    Last Vitals:  Vitals:   10/22/16 1710 10/22/16 1720  BP: (!) 143/100 130/87  Pulse: 77 72  Resp: 12 16  Temp:      Last Pain:  Vitals:   10/22/16 1720  TempSrc:   PainSc: 10-Worst pain ever                 Bobbiejo Ishikawa COKER

## 2016-10-22 NOTE — Anesthesia Preprocedure Evaluation (Addendum)
Anesthesia Evaluation  Patient identified by MRN, date of birth, ID band Patient awake    Reviewed: Allergy & Precautions, H&P , NPO status , Patient's Chart, lab work & pertinent test results, reviewed documented beta blocker date and time   Airway Mallampati: II  TM Distance: >3 FB Neck ROM: Full    Dental no notable dental hx. (+) Teeth Intact, Dental Advisory Given   Pulmonary neg pulmonary ROS, PE   Pulmonary exam normal breath sounds clear to auscultation       Cardiovascular hypertension, Pt. on medications and Pt. on home beta blockers + Peripheral Vascular Disease and + DVT   Rhythm:Regular Rate:Normal     Neuro/Psych  Headaches, negative psych ROS   GI/Hepatic Neg liver ROS, GERD  Medicated and Controlled,  Endo/Other  negative endocrine ROS  Renal/GU negative Renal ROS  negative genitourinary   Musculoskeletal  (+) Arthritis , Osteoarthritis,    Abdominal   Peds  Hematology negative hematology ROS (+)   Anesthesia Other Findings   Reproductive/Obstetrics negative OB ROS                            Anesthesia Physical  Anesthesia Plan  ASA: III  Anesthesia Plan: General and Regional   Post-op Pain Management: GA combined w/ Regional for post-op pain   Induction: Intravenous  Airway Management Planned: LMA  Additional Equipment:   Intra-op Plan:   Post-operative Plan: Extubation in OR  Informed Consent: I have reviewed the patients History and Physical, chart, labs and discussed the procedure including the risks, benefits and alternatives for the proposed anesthesia with the patient or authorized representative who has indicated his/her understanding and acceptance.   Dental advisory given  Plan Discussed with: CRNA  Anesthesia Plan Comments: (On Lovenox still Discussed GA with LMA, possible sore throat, potential need to switch to ETT, N/V, pulmonary aspiration.  Questions answered. )        Anesthesia Quick Evaluation

## 2016-10-22 NOTE — H&P (Signed)
TOTAL KNEE ADMISSION H&P  Patient is being admitted for right total knee arthroplasty.  Subjective:  Chief Complaint:right knee pain.  HPI: Jeremy [name], 55 y.o. male, has a history of pain and functional disability in the right knee due to arthritis and has failed non-surgical conservative treatments for greater than 12 weeks to includeNSAID's and/or analgesics, corticosteriod injections, viscosupplementation injections and activity modification.  Onset of symptoms was gradual, starting 5 years ago with gradually worsening course since that time. The patient noted no past surgery on the right knee(s).  Patient currently rates pain in the right knee(s) at 8 out of 10 with activity. Patient has night pain, worsening of pain with activity and weight bearing, pain that interferes with activities of daily living, pain with passive range of motion and joint swelling.  Patient has evidence of subchondral sclerosis, periarticular osteophytes and joint space narrowing by imaging studies. This patient has had reasonable conservative care. There is no active infection.  Patient Active Problem List   Diagnosis Date Noted  . History of pulmonary embolism 03/06/2016  . Primary osteoarthritis of left knee 03/05/2016   Past Medical History:  Diagnosis Date  . Arthritis    both knees   . GERD (gastroesophageal reflux disease)   . H/O renal calculi    has been passed spontaneously but also had cystoscopy for one   . Headache    migraines on occasion - used imitrex 5- 6 yrs. ago   . History of kidney stones    cystoscopy x1, also has past spontaneously  . Hypertension   . Irritable bowel syndrome (IBS)   . PE (pulmonary embolism) 1992   post op- 1 week  . Peripheral vascular disease ([place])    post op DVT-  approx. 2009, treated at [facility]   . Seasonal allergies     Past Surgical History:  Procedure Laterality Date  . ANKLE SURGERY Right    repair of fracture & repair of torn tendons.  Marland Kitchen CARDIAC  CATHETERIZATION     diagnostic only  . KNEE SURGERY     8 arthroscopies & one repair of tendon- R   . PERIPHERALLY INSERTED CENTRAL CATHETER INSERTION Right 2001   staph infection in R knee, post surgical, treated with antibiotics    . TONSILLECTOMY    . TOTAL KNEE ARTHROPLASTY Left 03/05/2016   Procedure: LEFT TOTAL KNEE ARTHROPLASTY;  Surgeon: Dorna Leitz, MD;  Location: [place];  Service: Orthopedics;  Laterality: Left;  Marland Kitchen VOCAL CORD INJECTION  2002   bilateral medialization, implants on both vocal chords , done at [facility].     No prescriptions prior to admission.   Allergies  Allergen Reactions  . Morphine And Related Nausea Only    Social History  Substance Use Topics  . Smoking status: Never Smoker  . Smokeless tobacco: Never Used  . Alcohol use No    No family history on file.   ROS ROS: I have reviewed the patient's review of systems thoroughly and there are no positive responses as relates to the HPI. Objective:  Physical Exam  Vital signs in last 24 hours:   Well-developed well-nourished patient in no acute distress. Alert and oriented x3 HEENT:within normal limits Cardiac: Regular rate and rhythm Pulmonary: Lungs clear to auscultation Abdomen: Soft and nontender.  Normal active bowel sounds  Musculoskeletalright knee: Painful range of motion.  Limited range of motion.  No instability.  Trace effusion.  Neurovascularly intact distally.) Labs:  Recent Results (from the past  2160 hour(s))  Type and screen Order type and screen if day of surgery is less than 15 days from draw of preadmission visit or order morning of surgery if day of surgery is greater than 6 days from preadmission visit.     Status: None   Collection Time: 10/17/16  2:58 PM  Result Value Ref Range   ABO/RH(D) A POS    Antibody Screen NEG    Sample Expiration 10/31/2016    Extend sample reason NO TRANSFUSIONS OR PREGNANCY IN THE PAST 3 MONTHS   Urinalysis, Routine w reflex microscopic (not  at [facility])     Status: None   Collection Time: 10/17/16  2:59 PM  Result Value Ref Range   Color, Urine YELLOW YELLOW   APPearance CLEAR CLEAR   Specific Gravity, Urine 1.007 1.005 - 1.030   pH 5.0 5.0 - 8.0   Glucose, UA NEGATIVE NEGATIVE mg/dL   Hgb urine dipstick NEGATIVE NEGATIVE   Bilirubin Urine NEGATIVE NEGATIVE   Ketones, ur NEGATIVE NEGATIVE mg/dL   Protein, ur NEGATIVE NEGATIVE mg/dL   Nitrite NEGATIVE NEGATIVE   Leukocytes, UA NEGATIVE NEGATIVE    Comment: MICROSCOPIC NOT DONE ON URINES WITH NEGATIVE PROTEIN, BLOOD, LEUKOCYTES, NITRITE, OR GLUCOSE <1000 mg/dL.  Surgical pcr screen     Status: None   Collection Time: 10/17/16  2:59 PM  Result Value Ref Range   MRSA, PCR NEGATIVE NEGATIVE   Staphylococcus aureus NEGATIVE NEGATIVE    Comment:        The Xpert SA Assay (FDA approved for NASAL specimens in patients over 67 years of age), is one component of a comprehensive surveillance program.  Test performance has been validated by [facility] for patients greater than or equal to 98 year old. It is not intended to diagnose infection nor to guide or monitor treatment.   APTT     Status: None   Collection Time: 10/17/16  3:00 PM  Result Value Ref Range   aPTT 35 24 - 36 seconds  CBC WITH DIFFERENTIAL     Status: None   Collection Time: 10/17/16  3:00 PM  Result Value Ref Range   WBC 8.7 4.0 - 10.5 K/uL   RBC 5.14 4.22 - 5.81 MIL/uL   Hemoglobin 16.0 13.0 - 17.0 g/dL   HCT 45.8 39.0 - 52.0 %   MCV 89.1 78.0 - 100.0 fL   MCH 31.1 26.0 - 34.0 pg   MCHC 34.9 30.0 - 36.0 g/dL   RDW 13.6 11.5 - 15.5 %   Platelets 215 150 - 400 K/uL   Neutrophils Relative % 58 %   Neutro Abs 5.0 1.7 - 7.7 K/uL   Lymphocytes Relative 34 %   Lymphs Abs 3.0 0.7 - 4.0 K/uL   Monocytes Relative 6 %   Monocytes Absolute 0.5 0.1 - 1.0 K/uL   Eosinophils Relative 1 %   Eosinophils Absolute 0.1 0.0 - 0.7 K/uL   Basophils Relative 1 %   Basophils Absolute 0.1 0.0 - 0.1 K/uL   Comprehensive metabolic panel     Status: Abnormal   Collection Time: 10/17/16  3:00 PM  Result Value Ref Range   Sodium 139 135 - 145 mmol/L   Potassium 3.5 3.5 - 5.1 mmol/L   Chloride 102 101 - 111 mmol/L   CO2 28 22 - 32 mmol/L   Glucose, Bld 107 (H) 65 - 99 mg/dL   BUN 18 6 - 20 mg/dL   Creatinine, Ser 1.04 0.61 - 1.24 mg/dL  Calcium 9.5 8.9 - 10.3 mg/dL   Total Protein 7.3 6.5 - 8.1 g/dL   Albumin 4.0 3.5 - 5.0 g/dL   AST 29 15 - 41 U/L   ALT 31 17 - 63 U/L   Alkaline Phosphatase 48 38 - 126 U/L   Total Bilirubin 0.9 0.3 - 1.2 mg/dL   GFR calc non Af Amer >60 >60 mL/min   GFR calc Af Amer >60 >60 mL/min    Comment: (NOTE) The eGFR has been calculated using the CKD EPI equation. This calculation has not been validated in all clinical situations. eGFR's persistently <60 mL/min signify possible Chronic Kidney Disease.    Anion gap 9 5 - 15  Protime-INR     Status: Abnormal   Collection Time: 10/17/16  3:00 PM  Result Value Ref Range   Prothrombin Time 22.6 (H) 11.4 - 15.2 seconds   INR 1.96    Estimated body mass index is 30.49 kg/m as calculated from the following:   Height as of 10/17/16: 6' (1.829 m).   Weight as of 10/17/16: 102 kg (224 lb 12.8 oz).   Imaging Review Plain radiographs demonstrate severe degenerative joint disease of the right knee(s). The overall alignment ismild valgus. The bone quality appears to be good for age and reported activity level.  Assessment/Plan:  End stage arthritis, right knee   The patient history, physical examination, clinical judgment of the provider and imaging studies are consistent with end stage degenerative joint disease of the right knee(s) and total knee arthroplasty is deemed medically necessary. The treatment options including medical management, injection therapy arthroscopy and arthroplasty were discussed at length. The risks and benefits of total knee arthroplasty were presented and reviewed. The risks due to  aseptic loosening, infection, stiffness, patella tracking problems, thromboembolic complications and other imponderables were discussed. The patient acknowledged the explanation, agreed to proceed with the plan and consent was signed. Patient is being admitted for inpatient treatment for surgery, pain control, PT, OT, prophylactic antibiotics, VTE prophylaxis, progressive ambulation and ADL's and discharge planning. The patient is planning to be discharged home with home health services

## 2016-10-22 NOTE — Transfer of Care (Signed)
Immediate Anesthesia Transfer of Care Note  Patient: Jeremy [name]  Procedure(s) Performed: Procedure(s): TOTAL KNEE ARTHROPLASTY (Right)  Patient Location: PACU  Anesthesia Type:General  Level of Consciousness: oriented, sedated and patient cooperative  Airway & Oxygen Therapy: Patient Spontanous Breathing and Patient connected to face mask oxygen  Post-op Assessment: Report given to RN and Post -op Vital signs reviewed and stable  Post vital signs: Reviewed  Last Vitals:  Vitals:   10/22/16 1247 10/22/16 1638  BP: (!) 139/93   Pulse: 87   Resp: 13   Temp:  (!) (P) 35.9 C    Last Pain:  Vitals:   10/22/16 1042  TempSrc: Oral      Patients Stated Pain Goal: 2 ([id number])  Complications: No apparent anesthesia complications

## 2016-10-22 NOTE — Anesthesia Procedure Notes (Signed)
Procedure Name: LMA Insertion Date/Time: 10/22/2016 2:08 PM Performed by: Mariea Clonts Pre-anesthesia Checklist: Patient identified, Emergency Drugs available, Suction available and Patient being monitored Patient Re-evaluated:Patient Re-evaluated prior to inductionOxygen Delivery Method: Circle System Utilized Preoxygenation: Pre-oxygenation with 100% oxygen Intubation Type: IV induction Ventilation: Mask ventilation without difficulty LMA: LMA inserted LMA Size: 5.0 Number of attempts: 1 Airway Equipment and Method: Bite block Placement Confirmation: positive ETCO2 Tube secured with: Tape Dental Injury: Teeth and Oropharynx as per pre-operative assessment

## 2016-10-22 NOTE — Brief Op Note (Signed)
10/22/2016  4:05 PM  PATIENT:  Jeremy [name]  55 y.o. male  PRE-OPERATIVE DIAGNOSIS:  OSTEOARTHRITIS RIGHT KNEE  POST-OPERATIVE DIAGNOSIS:  OSTEOARTHRITIS RIGHT KNEE  PROCEDURE:  Procedure(s): TOTAL KNEE ARTHROPLASTY (Right)  SURGEON:  Surgeon(s) and Role:    * Dorna Leitz, MD - Primary  PHYSICIAN ASSISTANT:   ASSISTANTS: jim bethune PAC   ANESTHESIA:   general  EBL:  Total I/O In: 2000 [I.V.:2000] Out: 50 [Blood:50]  BLOOD ADMINISTERED:none  DRAINS: none   LOCAL MEDICATIONS USED:  MARCAINE    and OTHER experel  SPECIMEN:  No Specimen  DISPOSITION OF SPECIMEN:  N/A  COUNTS:  YES  TOURNIQUET:   Total Tourniquet Time Documented: Thigh (Right) - 69 minutes Total: Thigh (Right) - 69 minutes   DICTATION: .Other Dictation: Dictation Number none given  PLAN OF CARE: Admit to inpatient   PATIENT DISPOSITION:  PACU - hemodynamically stable.   Delay start of Pharmacological VTE agent (>24hrs) due to surgical blood loss or risk of bleeding: no

## 2016-10-22 NOTE — Progress Notes (Signed)
Orthopedic Tech Progress Note Patient Details:  Jeremy [name] [birth year] [id number]  Ortho Devices Ortho Device/Splint Location: footsie roll Ortho Device/Splint Interventions: Criss Alvine 10/22/2016, 8:08 PM

## 2016-10-23 ENCOUNTER — Encounter (HOSPITAL_COMMUNITY): Payer: Self-pay | Admitting: General Practice

## 2016-10-23 LAB — CBC
HCT: 38.1 % — ABNORMAL LOW (ref 39.0–52.0)
HEMOGLOBIN: 13.3 g/dL (ref 13.0–17.0)
MCH: 30.6 pg (ref 26.0–34.0)
MCHC: 34.9 g/dL (ref 30.0–36.0)
MCV: 87.8 fL (ref 78.0–100.0)
PLATELETS: 195 10*3/uL (ref 150–400)
RBC: 4.34 MIL/uL (ref 4.22–5.81)
RDW: 13.3 % (ref 11.5–15.5)
WBC: 11.4 10*3/uL — AB (ref 4.0–10.5)

## 2016-10-23 LAB — BASIC METABOLIC PANEL
ANION GAP: 5 (ref 5–15)
BUN: 16 mg/dL (ref 6–20)
CHLORIDE: 106 mmol/L (ref 101–111)
CO2: 27 mmol/L (ref 22–32)
Calcium: 8.3 mg/dL — ABNORMAL LOW (ref 8.9–10.3)
Creatinine, Ser: 0.98 mg/dL (ref 0.61–1.24)
GFR calc Af Amer: >60 mL/min (ref >60)
Glucose, Bld: 159 mg/dL — ABNORMAL HIGH (ref 65–99)
POTASSIUM: 3.8 mmol/L (ref 3.5–5.1)
SODIUM: 138 mmol/L (ref 135–145)

## 2016-10-23 NOTE — Plan of Care (Signed)
Problem: Physical Regulation: Goal: Will remain free from infection Outcome: Progressing No S/S of infection noted, VS WNL  Problem: Bowel/Gastric: Goal: Will not experience complications related to bowel motility Outcome: Progressing No bowel issues reported

## 2016-10-23 NOTE — Op Note (Signed)
NAMELARZ, BISSETTE NO.:  [id number]  MEDICAL RECORD NO.:  [id number]  LOCATION:                               FACILITY:  [facility]  PHYSICIAN:  Alta Corning, M.D.   DATE OF BIRTH:  September 17, 1961  DATE OF PROCEDURE:  10/22/2016 DATE OF DISCHARGE:                              OPERATIVE REPORT   PREOPERATIVE DIAGNOSIS:  End-stage degenerative joint disease, right knee, status post previous high tibial osteotomy complicated by postoperative infection.  POSTOPERATIVE DIAGNOSIS:  End-stage degenerative joint disease, right knee, status post previous high tibial osteotomy complicated by postoperative infection.  PROCEDURE PERFORMED:  Right total knee replacement with a Sigma system, size 7 femur, size 8 tibia, size 6 bridging bearing and a 41 mm all polyethylene patella.  SURGEON:  Alta Corning, M.D.  ASSISTANT:  Gary Fleet, P.A.  ANESTHESIA:  General.  BRIEF HISTORY:  Mr. Dice is a 55 year old male with long history of significant complaints of bilateral knee pain.  He had a left total knee replacement and did well with that.  He came for right total knee replacement after failure of all conservative care.  He was noted preoperatively to have had a previous high tibial osteotomy and he also had it complicated by staph infection.  This was many years ago, but we wanted to keep that in mind when he was brought to the operating room for this procedure.  DESCRIPTION OF PROCEDURE:  The patient was brought to the operative room.  After adequate anesthesia was obtained with general anesthetic, the patient was placed supine on the operating room table.  The right leg was prepped and draped in usual sterile fashion.  Following this, the leg was exsanguinated.  Tourniquet inflated to 300 mmHg.  Following this, a midline incision was made in subcutaneous tissue down to the level of extensor mechanism.  Medial parapatellar arthrotomy was undertaken.  Once this was  done, the medial and lateral meniscus removed retropatellar fat pad, synovium on the anterior aspect of the femur, and anterior posterior cruciate.  Attention was then turned to the femur where an intramedullary pilot hole was drilled and an intramedullary rod was placed and a 5-degree inclination valgus cut was made with a 9 mm distal bone being resected.  Once this was completed, attention was turned to the tibia, where an extramedullary guide was placed and the tibia was cut perpendicular to its long axis.  Spacer block of 5 was put in place and the patient comes easily into full extension.  Menisci removed and attention then turned back to the femur, which was sized to a 7.  Anterior and posterior cuts were made, chamfers and box.  Tibia was sized to an 8, was drilled and keeled, and then the trial components were put in place with a 5 spacer.  Attention was turned to the patella, 41 poly was chosen and lugs were drilled for this and the trial was put in place.  The knee was then put through a range of motion.  Excellent stability and range of motion are achieved.  The trial components were removed.  The knee was then copiously irrigated.  Suction dry, pulsatile  lavage, irrigation and then attention is turned towards the femur where some Marcaine Exparel mix was instilled all around the end of the femur and this was dried and following this, the cement was mixed and then the final components were cemented in place size 8 tibia, size 7 femur, and a size 5 trial was used here and a 41 All-Poly patella and put through range of motion.  At this point, it is held in one position while the cement is allowed to harden, and all excess bone cement is removed.  The cement was allowed to completely harden.  The rim was very cool and it took almost 22 minutes for the cement to harden, which was very unusual, but did go to some extra tourniquet time.  Once the cement was hardened, we let the  tourniquet down.  All bleeding was controlled with electrocautery.  Put the additional Exparel throughout the synovial reflection and throughout the knee and posteriorly.  A 5 poly had been placed as a trial, but then we used a 6 and trialed it.  I thought we got still full extension, maybe a little bit better stability, so went with a 6 at this point.  It was opened and placed.  The medial parapatellar arthrotomy was closed with 1 Vicryl running, skin with 0 and 2-0 Vicryl, and 3-0 Monocryl subcuticular.  Benzoin and Steri-Strips were applied.  Sterile compressive dressing was applied and the patient was taken to the recovery and noted to be in satisfactory condition. Estimated blood loss for the procedure is minimal.     Alta Corning, M.D.   ______________________________ Alta Corning, M.D.    Corliss Skains  D:  10/22/2016  T:  10/23/2016  Job:  [id number]  cc:   Alta Corning, M.D.

## 2016-10-23 NOTE — Evaluation (Signed)
Physical Therapy Evaluation Patient Details Name: Jeremy [name] MRN: [id number] DOB: Feb 06, 1961 Today's Date: 10/23/2016   History of Present Illness  Pt admitted for elective R TKA. Pt with history of L TKA.  Clinical Impression  Pt is s/p TKA resulting in the deficits listed below (see PT Problem List). Pt moving very well. Pt most limited by swelling on R knee, limiting active ROM. Pt will benefit from skilled PT to increase their independence and safety with mobility to allow discharge to the venue listed below. Pt functioning at supervision level and would benefit from going straight to outpt PT.    Follow Up Recommendations Outpatient PT;Supervision - Intermittent    Equipment Recommendations  None recommended by PT    Recommendations for Other Services       Precautions / Restrictions Precautions Precautions: Knee Precaution Booklet Issued: Yes (comment) Precaution Comments: pt indep with HEP Required Braces or Orthoses: Knee Immobilizer - Right Knee Immobilizer - Right: Discontinue once straight leg raise with < 10 degree lag Restrictions Weight Bearing Restrictions: No RLE Weight Bearing: Weight bearing as tolerated      Mobility  Bed Mobility Overal bed mobility: Modified Independent             General bed mobility comments: hob elevatated, able to manage R LE in KI  Transfers Overall transfer level: Needs assistance Equipment used: Rolling walker (2 wheeled) Transfers: Sit to/from Stand Sit to Stand: Min guard         General transfer comment: v/c's for hand placement, increased time  Ambulation/Gait Ambulation/Gait assistance: Supervision Ambulation Distance (Feet): 175 Feet Assistive device: Rolling walker (2 wheeled) Gait Pattern/deviations: Step-through pattern;Step-to pattern;Antalgic Gait velocity: wfl for surgery Gait velocity interpretation: Below normal speed for age/gender General Gait Details: initially step to but s/p 25' pt  transitioned to step through, increased UE dependence   Stairs            Wheelchair Mobility    Modified Rankin (Stroke Patients Only)       Balance Overall balance assessment:  (needs RW for safe amb, can stand withou UE support)                                           Pertinent Vitals/Pain Pain Assessment: 0-10 Pain Score: 5  Pain Location: R knee Pain Descriptors / Indicators: Aching Pain Intervention(s): Monitored during session    Home Living Family/patient expects to be discharged to:: Private residence Living Arrangements: Spouse/significant other Available Help at Discharge: Family Type of Home: House Home Access: Stairs to enter Entrance Stairs-Rails: None Entrance Stairs-Number of Steps: 2 (platform) Home Layout: One level Home Equipment: Environmental consultant - 2 wheels;Bedside commode      Prior Function Level of Independence: Independent         Comments: [employer] in [place] for substance abuse     Hand Dominance   Dominant Hand: Right    Extremity/Trunk Assessment   Upper Extremity Assessment: Overall WFL for tasks assessed           Lower Extremity Assessment: RLE deficits/detail RLE Deficits / Details: able to initiate quad set.15 deg active R knee flex in supine, 45 deg AA knee flexion in sitting    Cervical / Trunk Assessment: Normal  Communication   Communication: No difficulties  Cognition Arousal/Alertness: Awake/alert Behavior During Therapy: WFL for tasks assessed/performed Overall Cognitive  Status: Within Functional Limits for tasks assessed                      General Comments General comments (skin integrity, edema, etc.): R knee swelling    Exercises Total Joint Exercises Ankle Circles/Pumps: PROM;Both;10 reps;Supine Quad Sets: AROM;Right;10 reps;Supine Heel Slides: AAROM;Right;10 reps;Seated   Assessment/Plan    PT Assessment Patient needs continued PT services  PT  Problem List Decreased strength;Decreased range of motion;Decreased balance;Decreased activity tolerance;Decreased mobility;Pain          PT Treatment Interventions DME instruction;Gait training;Stair training;Functional mobility training;Therapeutic activities;Therapeutic exercise;Balance training    PT Goals (Current goals can be found in the Care Plan section)  Acute Rehab PT Goals Patient Stated Goal: home PT Goal Formulation: With patient Time For Goal Achievement: 10/30/16 Potential to Achieve Goals: Good    Frequency 7X/week   Barriers to discharge        Co-evaluation               End of Session Equipment Utilized During Treatment: Gait belt Activity Tolerance: Patient tolerated treatment well Patient left: in chair;with call bell/phone within reach;with family/visitor present Nurse Communication: Mobility status         Time: 0827-0909 PT Time Calculation (min) (ACUTE ONLY): 42 min   Charges:   PT Evaluation $PT Eval Moderate Complexity: 1 Procedure PT Treatments $Gait Training: 8-22 mins $Therapeutic Exercise: 8-22 mins   PT G Codes:        Jeremy [name] 10/23/2016, 9:21 AM   Kittie Plater, PT, DPT Pager #: [phone] Office #: [phone]

## 2016-10-23 NOTE — Progress Notes (Signed)
Physical Therapy Treatment Patient Details Name: Jeremy [name] MRN: [id number] DOB: 1961-11-25 Today's Date: 2016-10-30    History of Present Illness Pt admitted for elective R TKA. Pt with history of L TKA.    PT Comments    On arrival, pt reports receiving dilaudid and refused out of bed activity due to feeling loopy.  SPTA performed therapeutic exercise in supine.  Pt will require stair training next session before D/C home.  Follow Up Recommendations  Outpatient PT;Supervision - Intermittent     Equipment Recommendations  None recommended by PT    Recommendations for Other Services       Precautions / Restrictions Precautions Precautions: Knee Precaution Booklet Issued: Yes (comment) Precaution Comments: pt indep with HEP Required Braces or Orthoses: Knee Immobilizer - Right Knee Immobilizer - Right: Discontinue once straight leg raise with < 10 degree lag Restrictions Weight Bearing Restrictions: No RLE Weight Bearing: Weight bearing as tolerated    Mobility  Bed Mobility  Transfers  Ambulation/Gait    Stairs            Wheelchair Mobility    Modified Rankin (Stroke Patients Only)       Balance                                  Cognition Arousal/Alertness: Suspect due to medications;Lethargic (Pt just received IV dilaudid and reports feeling loopy.) Behavior During Therapy: WFL for tasks assessed/performed Overall Cognitive Status: Within Functional Limits for tasks assessed                      Exercises Total Joint Exercises Ankle Circles/Pumps: AROM;Right;10 reps;Supine Quad Sets: AROM;Right;10 reps;Supine Towel Squeeze: AROM;Both;10 reps;Supine Short Arc Quad: AROM;Right;10 reps;Supine Heel Slides: AROM;Right;10 reps;Supine Hip ABduction/ADduction: AROM;Right;10 reps;Supine Straight Leg Raises: AROM;Right;10 reps;Supine    General Comments      Pertinent Vitals/Pain Pain Assessment: Faces Pain Score: 5   Faces Pain Scale: Hurts even more Pain Location: R knee Pain Descriptors / Indicators: Aching Pain Intervention(s): Limited activity within patient's tolerance;Monitored during session    [place] expects to be discharged to:: Private residence Living Arrangements: Spouse/significant other Available Help at Discharge: Family Type of Home: House Home Access: Stairs to enter Entrance Stairs-Rails: None Home Layout: One level Home Equipment: Environmental consultant - 2 wheels;Bedside commode      Prior Function Level of Independence: Independent      Comments: [employer] [facility] [employer] in [place] for substance abuse   PT Goals (current goals can now be found in the care plan section) Acute Rehab PT Goals Patient Stated Goal: home PT Goal Formulation: With patient Time For Goal Achievement: 10/30/16 Potential to Achieve Goals: Good    Frequency    7X/week      PT Plan      Co-evaluation             End of Session Equipment Utilized During Treatment: Gait belt Activity Tolerance: Patient tolerated treatment well Patient left: in bed;with call bell/phone within reach     Time: 1232-1242 PT Time Calculation (min) (ACUTE ONLY): 10 min  Charges:  $Therapeutic Exercise: 8-22 mins                    G Codes:      Bary Castilla Oct 30, 2016, 12:57 PM  Rito Ehrlich. [place], [place]

## 2016-10-23 NOTE — Op Note (Deleted)
  The note originally documented on this encounter has been moved the the encounter in which it belongs.  

## 2016-10-23 NOTE — Care Management Note (Signed)
Case Management Note  Patient Details  Name: Jeremy [name] MRN: [id number] Date of Birth: [birth year]  Subjective/Objective:   55 yr old gentleman s/p right total knee arthroplasty.                 Action/Plan: Case manager spoke with patient concerning discharge plan and DME needs. Patient states he will be going to [facility] in [facility]. He has rolling walker and 3in1 from previous knee surgery, CPM will be delivered to his home by [employer]. Patient will have family support at discharge.    Expected Discharge Date:   10/24/16               Expected Discharge Plan:   Home/self Care  In-House Referral:  NA  Discharge planning Services  CM Consult  Post Acute Care Choice:  NA Choice offered to:  Patient  DME Arranged:  CPM DME Agency:  [employer]  HH Arranged:  NA HH Agency:  NA  Status of Service:  Completed, signed off  If discussed at [place] of Stay Meetings, dates discussed:    Additional Comments:  Ninfa Meeker, RN 10/23/2016, 4:22 PM

## 2016-10-23 NOTE — Discharge Instructions (Signed)
INSTRUCTIONS AFTER JOINT REPLACEMENT  ° °o Remove items at home which could result in a fall. This includes throw rugs or furniture in walking pathways °o ICE to the affected joint every three hours while awake for 30 minutes at a time, for at least the first 3-5 days, and then as needed for pain and swelling.  Continue to use ice for pain and swelling. You may notice swelling that will progress down to the foot and ankle.  This is normal after surgery.  Elevate your leg when you are not up walking on it.   °o Continue to use the breathing machine you got in the hospital (incentive spirometer) which will help keep your temperature down.  It is common for your temperature to cycle up and down following surgery, especially at night when you are not up moving around and exerting yourself.  The breathing machine keeps your lungs expanded and your temperature down. ° ° °DIET:  As you were doing prior to hospitalization, we recommend a well-balanced diet. ° °DRESSING / WOUND CARE / SHOWERING ° °Keep the surgical dressing until follow up.  The dressing is water proof, so you can shower without any extra covering.  IF THE DRESSING FALLS OFF or the wound gets wet inside, change the dressing with sterile gauze.  Please use good hand washing techniques before changing the dressing.  Do not use any lotions or creams on the incision until instructed by your surgeon.   ° °ACTIVITY ° °o Increase activity slowly as tolerated, but follow the weight bearing instructions below.   °o No driving for 6 weeks or until further direction given by your physician.  You cannot drive while taking narcotics.  °o No lifting or carrying greater than 10 lbs. until further directed by your surgeon. °o Avoid periods of inactivity such as sitting longer than an hour when not asleep. This helps prevent blood clots.  °o You may return to work once you are authorized by your doctor.  ° ° ° °WEIGHT BEARING  ° °Weight bearing as tolerated with assist  device (walker, cane, etc) as directed, use it as long as suggested by your surgeon or therapist, typically at least 4-6 weeks. ° ° °EXERCISES ° °Results after joint replacement surgery are often greatly improved when you follow the exercise, range of motion and muscle strengthening exercises prescribed by your doctor. Safety measures are also important to protect the joint from further injury. Any time any of these exercises cause you to have increased pain or swelling, decrease what you are doing until you are comfortable again and then slowly increase them. If you have problems or questions, call your caregiver or physical therapist for advice.  ° °Rehabilitation is important following a joint replacement. After just a few days of immobilization, the muscles of the leg can become weakened and shrink (atrophy).  These exercises are designed to build up the tone and strength of the thigh and leg muscles and to improve motion. Often times heat used for twenty to thirty minutes before working out will loosen up your tissues and help with improving the range of motion but do not use heat for the first two weeks following surgery (sometimes heat can increase post-operative swelling).  ° °These exercises can be done on a training (exercise) mat, on the floor, on a table or on a bed. Use whatever works the best and is most comfortable for you.    Use music or television while you are exercising so that   the exercises are a pleasant break in your day. This will make your life better with the exercises acting as a break in your routine that you can look forward to.   Perform all exercises about fifteen times, three times per day or as directed.  You should exercise both the operative leg and the other leg as well. ° °Exercises include: °  °• Quad Sets - Tighten up the muscle on the front of the thigh (Quad) and hold for 5-10 seconds.   °• Straight Leg Raises - With your knee straight (if you were given a brace, keep it on),  lift the leg to 60 degrees, hold for 3 seconds, and slowly lower the leg.  Perform this exercise against resistance later as your leg gets stronger.  °• Leg Slides: Lying on your back, slowly slide your foot toward your buttocks, bending your knee up off the floor (only go as far as is comfortable). Then slowly slide your foot back down until your leg is flat on the floor again.  °• Angel Wings: Lying on your back spread your legs to the side as far apart as you can without causing discomfort.  °• Hamstring Strength:  Lying on your back, push your heel against the floor with your leg straight by tightening up the muscles of your buttocks.  Repeat, but this time bend your knee to a comfortable angle, and push your heel against the floor.  You may put a pillow under the heel to make it more comfortable if necessary.  ° °A rehabilitation program following joint replacement surgery can speed recovery and prevent re-injury in the future due to weakened muscles. Contact your doctor or a physical therapist for more information on knee rehabilitation.  ° ° °CONSTIPATION ° °Constipation is defined medically as fewer than three stools per week and severe constipation as less than one stool per week.  Even if you have a regular bowel pattern at home, your normal regimen is likely to be disrupted due to multiple reasons following surgery.  Combination of anesthesia, postoperative narcotics, change in appetite and fluid intake all can affect your bowels.  ° °YOU MUST use at least one of the following options; they are listed in order of increasing strength to get the job done.  They are all available over the counter, and you may need to use some, POSSIBLY even all of these options:   ° °Drink plenty of fluids (prune juice may be helpful) and high fiber foods °Colace 100 mg by mouth twice a day  °Senokot for constipation as directed and as needed Dulcolax (bisacodyl), take with full glass of water  °Miralax (polyethylene glycol)  once or twice a day as needed. ° °If you have tried all these things and are unable to have a bowel movement in the first 3-4 days after surgery call either your surgeon or your primary doctor.   ° °If you experience loose stools or diarrhea, hold the medications until you stool forms back up.  If your symptoms do not get better within 1 week or if they get worse, check with your doctor.  If you experience "the worst abdominal pain ever" or develop nausea or vomiting, please contact the office immediately for further recommendations for treatment. ° ° °ITCHING:  If you experience itching with your medications, try taking only a single pain pill, or even half a pain pill at a time.  You can also use Benadryl over the counter for itching or also to   help with sleep.   TED HOSE STOCKINGS:  Use stockings on both legs until for at least 2 weeks or as directed by physician office. They may be removed at night for sleeping.  MEDICATIONS:  See your medication summary on the After Visit Summary that nursing will review with you.  You may have some home medications which will be placed on hold until you complete the course of blood thinner medication.  It is important for you to complete the blood thinner medication as prescribed.  PRECAUTIONS:  If you experience chest pain or shortness of breath - call 911 immediately for transfer to the hospital emergency department.   If you develop a fever greater that 101 F, purulent drainage from wound, increased redness or drainage from wound, foul odor from the wound/dressing, or calf pain - CONTACT YOUR SURGEON.                                                   FOLLOW-UP APPOINTMENTS:  If you do not already have a post-op appointment, please call the office for an appointment to be seen by your surgeon.  Guidelines for how soon to be seen are listed in your After Visit Summary, but are typically between 1-4 weeks after surgery.  OTHER INSTRUCTIONS:   Knee  Replacement:  Do not place pillow under knee, focus on keeping the knee straight while resting. CPM instructions: 0-90 degrees, 2 hours in the morning, 2 hours in the afternoon, and 2 hours in the evening. Place foam block, curve side up under heel at all times except when in CPM or when walking.  DO NOT modify, tear, cut, or change the foam block in any way.  MAKE SURE YOU:   Understand these instructions.   Get help right away if you are not doing well or get worse.    Thank you for letting us be a part of your medical care team.  It is a privilege we respect greatly.  We hope these instructions will help you stay on track for a fast and full recovery!   Information on my medicine - Coumadin   (Warfarin)  This medication education was reviewed with me or my healthcare representative as part of my discharge preparation.  The pharmacist that spoke with me during my hospital stay was:  Jaquita Folds, [facility]  Why was Coumadin prescribed for you? Coumadin was prescribed for you because you have a blood clot or a medical condition that can cause an increased risk of forming blood clots. Blood clots can cause serious health problems by blocking the flow of blood to the heart, lung, or brain. Coumadin can prevent harmful blood clots from forming. As a reminder your indication for Coumadin is:   Blood Clotting Disorder  History of Pulmonary embolism and Deep Venous Thrombosis.  What test will check on my response to Coumadin? While on Coumadin (warfarin) you will need to have an INR test regularly to ensure that your dose is keeping you in the desired range. The INR (international normalized ratio) number is calculated from the result of the laboratory test called prothrombin time (PT).  If an INR APPOINTMENT HAS NOT ALREADY BEEN MADE FOR YOU please schedule an appointment to have this lab work done by your health care provider within 7 days. Your INR goal is usually a number  between:  2 to 3 or your  provider may give you a more narrow range like 2-2.5.  Ask your health care provider during an office visit what your goal INR is.  What  do you need to  know  About  COUMADIN? Take Coumadin (warfarin) exactly as prescribed by your healthcare provider about the same time each day.  DO NOT stop taking without talking to the doctor who prescribed the medication.  Stopping without other blood clot prevention medication to take the place of Coumadin may increase your risk of developing a new clot or stroke.  Get refills before you run out.  What do you do if you miss a dose? If you miss a dose, take it as soon as you remember on the same day then continue your regularly scheduled regimen the next day.  Do not take two doses of Coumadin at the same time.  Important Safety Information A possible side effect of Coumadin (Warfarin) is an increased risk of bleeding. You should call your healthcare provider right away if you experience any of the following: ? Bleeding from an injury or your nose that does not stop. ? Unusual colored urine (red or dark brown) or unusual colored stools (red or black). ? Unusual bruising for unknown reasons. ? A serious fall or if you hit your head (even if there is no bleeding).  Some foods or medicines interact with Coumadin (warfarin) and might alter your response to warfarin. To help avoid this: ? Eat a balanced diet, maintaining a consistent amount of Vitamin K. ? Notify your provider about major diet changes you plan to make. ? Avoid alcohol or limit your intake to 1 drink for women and 2 drinks for men per day. (1 drink is 5 oz. wine, 12 oz. beer, or 1.5 oz. liquor.)  Make sure that ANY health care provider who prescribes medication for you knows that you are taking Coumadin (warfarin).  Also make sure the healthcare provider who is monitoring your Coumadin knows when you have started a new medication including herbals and non-prescription products.  Coumadin  (Warfarin)  Major Drug Interactions  Increased Warfarin Effect Decreased Warfarin Effect  Alcohol (large quantities) Antibiotics (esp. Septra/Bactrim, Flagyl, Cipro) Amiodarone (Cordarone) Aspirin (ASA) Cimetidine (Tagamet) Megestrol (Megace) NSAIDs (ibuprofen, naproxen, etc.) Piroxicam (Feldene) Propafenone (Rythmol SR) Propranolol (Inderal) Isoniazid (INH) Posaconazole (Noxafil) Barbiturates (Phenobarbital) Carbamazepine (Tegretol) Chlordiazepoxide (Librium) Cholestyramine (Questran) Griseofulvin Oral Contraceptives Rifampin Sucralfate (Carafate) Vitamin K   Coumadin (Warfarin) Major Herbal Interactions  Increased Warfarin Effect Decreased Warfarin Effect  Garlic Ginseng Ginkgo biloba Coenzyme Q10 Green tea St. Johns wort    Coumadin (Warfarin) FOOD Interactions  Eat a consistent number of servings per week of foods HIGH in Vitamin K (1 serving =  cup)  Collards (cooked, or boiled & drained) Kale (cooked, or boiled & drained) Mustard greens (cooked, or boiled & drained) Parsley *serving size only =  cup Spinach (cooked, or boiled & drained) Swiss chard (cooked, or boiled & drained) Turnip greens (cooked, or boiled & drained)  Eat a consistent number of servings per week of foods MEDIUM-HIGH in Vitamin K (1 serving = 1 cup)  Asparagus (cooked, or boiled & drained) Broccoli (cooked, boiled & drained, or raw & chopped) Brussel sprouts (cooked, or boiled & drained) *serving size only =  cup Lettuce, raw (green leaf, endive, romaine) Spinach, raw Turnip greens, raw & chopped   These websites have more information on Coumadin (warfarin):  FailFactory.se; VeganReport.com.au;

## 2016-10-23 NOTE — Progress Notes (Signed)
Subjective: 1 Day Post-Op Procedure(s) (LRB): TOTAL KNEE ARTHROPLASTY (Right) Patient reports pain as mild. Taking by mouth and voiding okay. Did great with physical therapy this morning. No chest pain or shortness of breath.  Objective: Vital signs in last 24 hours: Temp:  [96.6 F (35.9 C)-98.3 F (36.8 C)] 98.1 F (36.7 C) (11/21 0630) Pulse Rate:  [69-91] 86 (11/21 0630) Resp:  [11-20] 17 (11/21 0630) BP: (113-164)/(67-100) 121/67 (11/21 0630) SpO2:  [90 %-100 %] 94 % (11/21 0630) Weight:  [101.6 kg (224 lb)] 101.6 kg (224 lb) (11/20 1042)  Intake/Output from previous day: 11/20 0701 - 11/21 0700 In: 3080 [P.O.:580; I.V.:2500] Out: 1400 [Urine:1350; Blood:50] Intake/Output this shift: No intake/output data recorded.   Recent Labs  10/23/16 0505  HGB 13.3    Recent Labs  10/23/16 0505  WBC 11.4*  RBC 4.34  HCT 38.1*  PLT 195   No results for input(s): NA, K, CL, CO2, BUN, CREATININE, GLUCOSE, CALCIUM in the last 72 hours.  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 He is on chronic Coumadin for this. We will continue to bridge him from Lovenox back to Coumadin while hospitalized. We will do one more day of IV antibiotics since he has a history of staph infection in the right knee. Continue physical therapy. Will check pro time and INR in the morning. We'll plan on discharging home tomorrow and can possibly go directly to outpatient physical therapy. Weight-bear as tolerated on right. Continue CPM  Doris Mcgilvery G 10/23/2016, 9:22 AM

## 2016-10-24 LAB — CBC
HCT: 36.9 % — ABNORMAL LOW (ref 39.0–52.0)
Hemoglobin: 12.4 g/dL — ABNORMAL LOW (ref 13.0–17.0)
MCH: 30 pg (ref 26.0–34.0)
MCHC: 33.6 g/dL (ref 30.0–36.0)
MCV: 89.1 fL (ref 78.0–100.0)
Platelets: 217 K/uL (ref 150–400)
RBC: 4.14 MIL/uL — ABNORMAL LOW (ref 4.22–5.81)
RDW: 13.7 % (ref 11.5–15.5)
WBC: 14.3 K/uL — ABNORMAL HIGH (ref 4.0–10.5)

## 2016-10-24 LAB — PROTIME-INR
INR: 1.17
Prothrombin Time: 15 s (ref 11.4–15.2)

## 2016-10-24 MED ORDER — ENOXAPARIN SODIUM 100 MG/ML ~~LOC~~ SOLN
100.0000 mg | Freq: Two times a day (BID) | SUBCUTANEOUS | Status: DC
  Administered 2016-10-24: 100 mg via SUBCUTANEOUS
  Filled 2016-10-24: qty 1

## 2016-10-24 MED ORDER — ENOXAPARIN SODIUM 100 MG/ML ~~LOC~~ SOLN: 150.0000 mg | SUBCUTANEOUS | 0 refills | Status: DC

## 2016-10-24 NOTE — Progress Notes (Signed)
Physical Therapy Treatment Patient Details Name: Jeremy [name] MRN: [id number] DOB: March 09, 1961 Today's Date: 10/24/2016    History of Present Illness Pt admitted for elective R TKA. Pt with history of L TKA.    PT Comments    Patient continues to progress well toward mobility goals. Current plan remains appropriate.   Follow Up Recommendations  Outpatient PT;Supervision - Intermittent     Equipment Recommendations  None recommended by PT    Recommendations for Other Services       Precautions / Restrictions Precautions Precautions: Knee Restrictions Weight Bearing Restrictions: No RLE Weight Bearing: Weight bearing as tolerated    Mobility  Bed Mobility Overal bed mobility: Modified Independent             General bed mobility comments: increase time and effort  Transfers Overall transfer level: Needs assistance Equipment used: Rolling walker (2 wheeled) Transfers: Sit to/from Stand Sit to Stand: Supervision         General transfer comment: supervision for safety; cues for use of AD  Ambulation/Gait Ambulation/Gait assistance: Supervision Ambulation Distance (Feet): 200 Feet Assistive device: Rolling walker (2 wheeled) Gait Pattern/deviations: Step-through pattern     General Gait Details: pt with good step length symmetry and WB on R LE; cues for increased R  knee flexion during swing phase   Stairs Stairs: Yes Stairs assistance: Min assist Stair Management: No rails;Step to pattern;Backwards;With walker Number of Stairs: 3 General stair comments: cues for sequencing and technique with carry over demonstrated from previous sx; assist to stabilize RW  Wheelchair Mobility    Modified Rankin (Stroke Patients Only)       Balance                                    Cognition Arousal/Alertness: Awake/alert Behavior During Therapy: WFL for tasks assessed/performed Overall Cognitive Status: Within Functional Limits for tasks  assessed                      Exercises      General Comments General comments (skin integrity, edema, etc.): wife present      Pertinent Vitals/Pain Pain Assessment: 0-10 Pain Score: 8  Pain Location: R thigh  Pain Descriptors / Indicators: Aching;Guarding;Sore Pain Intervention(s): Limited activity within patient's tolerance;Monitored during session;Repositioned;RN gave pain meds during session    Home Living                      Prior Function            PT Goals (current goals can now be found in the care plan section) Acute Rehab PT Goals Patient Stated Goal: home Progress towards PT goals: Progressing toward goals    Frequency    7X/week      PT Plan Current plan remains appropriate    Co-evaluation             End of Session Equipment Utilized During Treatment: Gait belt Activity Tolerance: Patient tolerated treatment well Patient left: in chair;with call bell/phone within reach;with family/visitor present     Time: 1137-1203 PT Time Calculation (min) (ACUTE ONLY): 26 min  Charges:  $Gait Training: 8-22 mins $Therapeutic Activity: 8-22 mins                    G Codes:      Yamira Papa R Nelva Bush, PTA Pager: [phone]   10/24/2016, 12:14 PM

## 2016-10-24 NOTE — Progress Notes (Addendum)
Subjective: 2 Days Post-Op Procedure(s) (LRB): TOTAL KNEE ARTHROPLASTY (Right) Patient reports pain as mild.  Taking by mouth and voiding ok.  No cp or sob.  Objective: Vital signs in last 24 hours: Temp:  [98.2 F (36.8 C)-98.7 F (37.1 C)] 98.7 F (37.1 C) (11/22 0535) Pulse Rate:  [78-85] 78 (11/22 0535) Resp:  [16] 16 (11/22 0535) BP: (122-125)/(71-74) 125/74 (11/22 0535) SpO2:  [93 %-95 %] 95 % (11/22 0535)  Intake/Output from previous day: 11/21 0701 - 11/22 0700 In: 600 [P.O.:600] Out: 1500 [Urine:1500] Intake/Output this shift: Total I/O In: 240 [P.O.:240] Out: -    Recent Labs  10/23/16 0505 10/24/16 0540  HGB 13.3 12.4*    Recent Labs  10/23/16 0505 10/24/16 0540  WBC 11.4* 14.3*  RBC 4.34 4.14*  HCT 38.1* 36.9*  PLT 195 217    Recent Labs  10/23/16 0505  NA 138  K 3.8  CL 106  CO2 27  BUN 16  CREATININE 0.98  GLUCOSE 159*  CALCIUM 8.3*    Recent Labs  10/22/16 1134 10/24/16 0540  INR 1.07 1.17  right knee exam:  Neurovascular intact Sensation intact distally Intact pulses distally Dorsiflexion/Plantar flexion intact Incision: dressing C/D/I Compartment soft  Assessment/Plan: 2 Days Post-Op Procedure(s) (LRB): TOTAL KNEE ARTHROPLASTY (Right)  Plan: Discharge home today WBAT on right  Asa ec 325mg  bid x 1 month post op x 1 month post op Discharge home with home health  F/U with Dr Berenice Primas in 2 weeks To see PCP in 2 days for coumadin management    Send home on lovenox bridge  Jeremy [name] G 10/24/2016, 12:05 PM

## 2016-10-24 NOTE — Plan of Care (Signed)
Problem: Pain Managment: Goal: General experience of comfort will improve Outcome: Progressing Medicated once for pain with moderate relief  Problem: Physical Regulation: Goal: Will remain free from infection Outcome: Progressing No S/S of infection, VS WNL  Problem: Tissue Perfusion: Goal: Risk factors for ineffective tissue perfusion will decrease Outcome: Progressing Denies S/S of DVT, received Lovenox 100 mg SQ  Problem: Bowel/Gastric: Goal: Will not experience complications related to bowel motility Outcome: Progressing Denies bowel and gastric issues

## 2016-10-24 NOTE — Discharge Summary (Signed)
Patient ID: Jeremy [name] MRN: [id number] DOB/AGE: 04-10-1961 55 y.o.  Admit date: 10/22/2016 Discharge date: 10/24/2016  Admission Diagnoses:  Principal Problem:   Primary osteoarthritis of right knee Active Problems:   History of pulmonary embolus (PE)   Discharge Diagnoses:  Same  Past Medical History:  Diagnosis Date  . Arthritis    both knees   . GERD (gastroesophageal reflux disease)   . H/O renal calculi    has been passed spontaneously but also had cystoscopy for one   . Headache    migraines on occasion - used imitrex 5- 6 yrs. ago   . History of kidney stones    cystoscopy x1, also has past spontaneously  . Hypertension   . Irritable bowel syndrome (IBS)   . PE (pulmonary embolism) 1992   post op- 1 week  . Peripheral vascular disease ([place])    post op DVT-  approx. 2009, treated at [facility]   . Seasonal allergies     Surgeries: Procedure(s):Right TOTAL KNEE ARTHROPLASTY on 10/22/2016   Discharged Condition: Improved  Hospital Course: Jeremy [name] is an 55 y.o. male who was admitted 10/22/2016 for operative treatment ofPrimary osteoarthritis of right knee. Patient has severe unremitting pain that affects sleep, daily activities, and work/hobbies. After pre-op clearance the patient was taken to the operating room on 10/22/2016 and underwent  Procedure(s): Right TOTAL KNEE ARTHROPLASTY.    Patient was given perioperative antibiotics:  Anti-infectives    Start     Dose/Rate Route Frequency Ordered Stop   10/22/16 2200  ceFAZolin (ANCEF) IVPB 2g/100 mL premix     2 g 200 mL/hr over 30 Minutes Intravenous Every 8 hours 10/22/16 1837 10/24/16 2159   10/22/16 0600  ceFAZolin (ANCEF) IVPB 2g/100 mL premix     2 g 200 mL/hr over 30 Minutes Intravenous On call to O.R. 10/21/16 1405 10/22/16 1415       Patient was given sequential compression devices, early ambulation, and chemoprophylaxis to prevent DVT.The patient has a history of staph infection in his  right knee in the past. Therefore he was given additional doses of IV Ancef postoperatively to decrease risk of recurrent infection. The patient also is on Coumadin chronically for a history of pulmonary emboli. He was given Lovenox postoperatively bridging back over to Coumadin. He had no complications during this hospitalization. He denied chest pain or shortness of breath. On the date of discharge she was doing well with physical therapy. He was afebrile and his vital signs are stable. His right knee dressing was clean and dry. His calf was soft.  Patient benefited maximally from hospital stay and there were no complications.    Recent vital signs:  Patient Vitals for the past 24 hrs:  BP Temp Temp src Pulse Resp SpO2  10/24/16 1249 137/78 98.1 F (36.7 C) - 84 18 96 %  10/24/16 0535 125/74 98.7 F (37.1 C) Oral 78 16 95 %     Recent laboratory studies:   Recent Labs  10/22/16 1134 10/23/16 0505 10/24/16 0540  WBC  --  11.4* 14.3*  HGB  --  13.3 12.4*  HCT  --  38.1* 36.9*  PLT  --  195 217  NA  --  138  --   K  --  3.8  --   CL  --  106  --   CO2  --  27  --   BUN  --  16  --   CREATININE  --  0.98  --  GLUCOSE  --  159*  --   INR 1.07  --  1.17  CALCIUM  --  8.3*  --      Discharge Medications:     Medication List    STOP taking these medications   aspirin 81 MG EC tablet   enoxaparin 150 MG/ML injection Commonly known as:  LOVENOX Replaced by:  enoxaparin 100 MG/ML injection   ibuprofen 200 MG tablet Commonly known as:  ADVIL,MOTRIN     TAKE these medications   clidinium-chlordiazePOXIDE 5-2.5 MG capsule Commonly known as:  LIBRAX Take 1 capsule by mouth 3 (three) times daily before meals.   docusate sodium 100 MG capsule Commonly known as:  COLACE Take 1 capsule (100 mg total) by mouth 2 (two) times daily.   enoxaparin 100 MG/ML injection Commonly known as:  LOVENOX Inject 1.5 mLs (150 mg total) into the skin daily. Until INR  therapeutic Replaces:  enoxaparin 150 MG/ML injection   fexofenadine 180 MG tablet Commonly known as:  ALLEGRA Take 180 mg by mouth daily before breakfast.   Fish Oil 1000 MG Caps Take 1,000 mg by mouth daily before breakfast.   hydrochlorothiazide 25 MG tablet Commonly known as:  HYDRODIURIL Take 25 mg by mouth every morning.   HYDROmorphone 2 MG tablet Commonly known as:  DILAUDID Take 1-2 tablets (2-4 mg total) by mouth every 4 (four) hours as needed for severe pain.   Melatonin 5 MG Tabs Take 5 mg by mouth at bedtime.   methocarbamol 750 MG tablet Commonly known as:  ROBAXIN-750 Take 1 tablet (750 mg total) by mouth every 8 (eight) hours as needed for muscle spasms.   metoprolol succinate 25 MG 24 hr tablet Commonly known as:  TOPROL-XL Take 25 mg by mouth at bedtime.   omeprazole 20 MG capsule Commonly known as:  PRILOSEC Take 20-40 mg by mouth 2 (two) times daily before a meal. 40 mg every morning and 20 mg every evening   warfarin 3 MG tablet Commonly known as:  COUMADIN Take 3 mg by mouth daily at 6 PM.       Diagnostic Studies: No results found.  Disposition: 01-Home or Self Care  Discharge Instructions    CPM    Complete by:  As directed    Continuous passive motion machine (CPM):      Use the CPM from 0 to 70 for 8 hours per day.      You may increase by 5-10 per day.  You may break it up into 2 or 3 sessions per day.      Use CPM for 1-2 weeks or until you are told to stop.   Call MD / Call 911    Complete by:  As directed    If you experience chest pain or shortness of breath, CALL 911 and be transported to the hospital emergency room.  If you develope a fever above 101 F, pus (white drainage) or increased drainage or redness at the wound, or calf pain, call your surgeon's office.   Constipation Prevention    Complete by:  As directed    Drink plenty of fluids.  Prune juice may be helpful.  You may use a stool softener, such as Colace (over the  counter) 100 mg twice a day.  Use MiraLax (over the counter) for constipation as needed.   Diet general    Complete by:  As directed    Do not put a pillow under the knee. Place it under  the heel.    Complete by:  As directed    Increase activity slowly as tolerated    Complete by:  As directed    Weight bearing as tolerated    Complete by:  As directed    Laterality:  right   Extremity:  Lower      Follow-up Information    GRAVES,JOHN L, MD. Schedule an appointment as soon as possible for a visit in 2 weeks.   Specialty:  Orthopedic Surgery Contact information: [place] [address] [phone]         He will follow-up with his primary care physician in 2 days to have a pro time and INR checked. The patient has already made this appointment.This is with Dr. Gar Ponto. SignedErlene Senters 10/24/2016, 2:40 PM

## 2016-10-24 NOTE — Progress Notes (Signed)
Pt discharged to home with belongings, IVs removed. AVS and prescriptions given. Teach back performed with pt and wife. Pt stable at time of discharge.

## 2016-10-29 ENCOUNTER — Ambulatory Visit: Payer: PRIVATE HEALTH INSURANCE | Admitting: Physical Therapy

## 2016-10-30 ENCOUNTER — Ambulatory Visit: Payer: PRIVATE HEALTH INSURANCE | Attending: Orthopedic Surgery | Admitting: Physical Therapy

## 2016-10-30 DIAGNOSIS — M6281 Muscle weakness (generalized): Secondary | ICD-10-CM | POA: Diagnosis present

## 2016-10-30 DIAGNOSIS — M25561 Pain in right knee: Secondary | ICD-10-CM | POA: Diagnosis present

## 2016-10-30 DIAGNOSIS — R600 Localized edema: Secondary | ICD-10-CM | POA: Diagnosis present

## 2016-10-30 DIAGNOSIS — M25661 Stiffness of right knee, not elsewhere classified: Secondary | ICD-10-CM | POA: Diagnosis present

## 2016-10-30 NOTE — Therapy (Signed)
[facility] [place], [place], [address] Phone: [phone]   Fax:  [phone]  Physical Therapy Evaluation  Patient Details  Name: Jeremy [name] MRN: [id number] Date of Birth: June 13, 1961 Referring Provider: Dorna Leitz MD  Encounter Date: 10/30/2016      PT End of Session - 10/30/16 1457    Visit Number 1   Number of Visits 16   Date for PT Re-Evaluation 12/25/16   PT Start Time 0228   PT Stop Time 0316   PT Time Calculation (min) 48 min   Activity Tolerance Patient tolerated treatment well   Behavior During Therapy [facility] for tasks assessed/performed      Past Medical History:  Diagnosis Date  . Arthritis    both knees   . GERD (gastroesophageal reflux disease)   . H/O renal calculi    has been passed spontaneously but also had cystoscopy for one   . Headache    migraines on occasion - used imitrex 5- 6 yrs. ago   . History of kidney stones    cystoscopy x1, also has past spontaneously  . Hypertension   . Irritable bowel syndrome (IBS)   . PE (pulmonary embolism) 1992   post op- 1 week  . Peripheral vascular disease ([place])    post op DVT-  approx. 2009, treated at [facility]   . Seasonal allergies     Past Surgical History:  Procedure Laterality Date  . ANKLE SURGERY Right    repair of fracture & repair of torn tendons.  Marland Kitchen CARDIAC CATHETERIZATION     diagnostic only  . KNEE SURGERY     8 arthroscopies & one repair of tendon- R   . PERIPHERALLY INSERTED CENTRAL CATHETER INSERTION Right 2001   staph infection in R knee, post surgical, treated with antibiotics    . TONSILLECTOMY    . TOTAL KNEE ARTHROPLASTY Left 03/05/2016   Procedure: LEFT TOTAL KNEE ARTHROPLASTY;  Surgeon: Dorna Leitz, MD;  Location: [facility];  Service: Orthopedics;  Laterality: Left;  . TOTAL KNEE ARTHROPLASTY Right 10/22/2016  . TOTAL KNEE ARTHROPLASTY Right 10/22/2016   Procedure: TOTAL KNEE ARTHROPLASTY;  Surgeon: Dorna Leitz, MD;   Location: [place];  Service: Orthopedics;  Laterality: Right;  Marland Kitchen VOCAL CORD INJECTION  2002   bilateral medialization, implants on both vocal chords , done at [facility].     There were no vitals filed for this visit.       Subjective Assessment - 10/30/16 1452    Subjective The patient underwent a right total knee replacement on 10/22/16.  He was sent home with a CPM and hasbeen compliant to its use.  He reports his knee feels tight and his pain-level is an 8/10.  He is currently using a rolling walker.     Limitations Walking   How long can you walk comfortably? Short distance.   Patient Stated Goals Get back to normal.   Pain Score 8    Pain Location Knee   Pain Orientation Right   Pain Descriptors / Indicators Tightness;Aching   Pain Type Surgical pain   Pain Frequency Constant            [facility] PT Assessment - 10/30/16 0001      Assessment   Medical Diagnosis Right total knee replacement.   Referring Provider Dorna Leitz MD   Onset Date/Surgical Date --  10/22/16(surgery date).     Precautions   Precautions --  No ultrasound.  Restrictions   Weight Bearing Restrictions No     Balance Screen   Has the patient fallen in the past 6 months No   Has the patient had a decrease in activity level because of a fear of falling?  No   Is the patient reluctant to leave their home because of a fear of falling?  No     Home Environment   Living Environment Private residence     Observation/Other Assessments   Observations Post-surgical dressing donned.     Observation/Other Assessments-Edema    Edema Circumferential     Circumferential Edema   Circumferential - Right 45 cms.   Circumferential - Left  41 cms.     ROM / Strength   AROM / PROM / Strength AROM;Strength     AROM   Overall AROM Comments -10 degrees to 65 degrees of active right knee range of motion.     Strength   Overall Strength Comments Right hip strength= 3/5; right knee= 4-/5.      Palpation   Palpation comment Tender on both sidedsof patient's right knee.     Ambulation/Gait   Gait Comments Antalgic gait with a front wheel walker.                   [facility] Adult PT Treatment/Exercise - 10/30/16 0001      Exercises   Exercises Knee/Hip     Knee/Hip Exercises: Aerobic   Nustep Level 3 moving forward x 1 to increase flexion over 15 minutes     Modalities   Modalities Vasopneumatic     Vasopneumatic   Number Minutes Vasopneumatic  10 minutes   Vasopnuematic Location  --  Right knee.   Vasopneumatic Pressure Medium                     PT Long Term Goals - 10/30/16 1505      PT LONG TERM GOAL #1   Title Ind with an advanced HEP.   Time 8   Period Weeks   Status New     PT LONG TERM GOAL #2   Title Active right  knee flexion to 115 degrees+ so the patient can perform functional tasks and do so with pain not > 2-3/10.   Time 8   Period Weeks   Status New     PT LONG TERM GOAL #3   Title Decrease edema to within 2 cms of non-affected side to assist with pain reduction and range of motion gains.   Time 8   Period Weeks   Status New     PT LONG TERM GOAL #4   Title Increase right knee strength to a solid 5/5 to provide good stability for accomplishment of functional activities   Time 8   Period Weeks   Status New     PT LONG TERM GOAL #5   Title Perform a reciprocating stair gait with one railing with pain not > 2-3/10.   Time 8   Period Weeks   Status New               Plan - 10/30/16 1458    Clinical Impression Statement The patient currently presents a significant loss of right knee range of motion and a high pain-level.  He has a notable amount of edema as well.  He is using a FWW for safety.   Rehab Potential Excellent   PT Frequency 3x / week   PT Duration 8 weeks  PT Treatment/Interventions ADLs/Self Care Home Management;Cryotherapy;Electrical Stimulation;Therapeutic activities;Therapeutic  exercise;Neuromuscular re-education;Patient/family education;Passive range of motion;Vasopneumatic Device   PT Next Visit Plan Nustep; PROM to right knee; SLR's; Rockerboard; Vasopneumatice and e'stim (after post-surgical dressing is doffed).      Patient will benefit from skilled therapeutic intervention in order to improve the following deficits and impairments:  Abnormal gait, Decreased range of motion, Decreased strength, Increased edema, Pain  Visit Diagnosis: Acute pain of right knee - Plan: PT plan of care cert/re-cert  Stiffness of right knee, not elsewhere classified - Plan: PT plan of care cert/re-cert  Localized edema - Plan: PT plan of care cert/re-cert  Muscle weakness (generalized) - Plan: PT plan of care cert/re-cert     Problem List Patient Active Problem List   Diagnosis Date Noted  . History of pulmonary embolus (PE) 03/06/2016  . Primary osteoarthritis of right knee 03/05/2016    Anatole Apollo, [place] MPT 10/30/2016, 3:16 PM  [facility] [place], [place], [address] Phone: [phone]   Fax:  [phone]  Name: Jeremy [name] MRN: [id number] Date of Birth: 1961-08-13

## 2016-10-31 ENCOUNTER — Ambulatory Visit: Payer: PRIVATE HEALTH INSURANCE | Admitting: Physical Therapy

## 2016-10-31 DIAGNOSIS — M25561 Pain in right knee: Secondary | ICD-10-CM | POA: Diagnosis not present

## 2016-10-31 DIAGNOSIS — M25661 Stiffness of right knee, not elsewhere classified: Secondary | ICD-10-CM

## 2016-10-31 DIAGNOSIS — R600 Localized edema: Secondary | ICD-10-CM

## 2016-10-31 NOTE — Therapy (Signed)
[facility] [place], [place], [address] Phone: [phone]   Fax:  [phone]  Physical Therapy Treatment  Patient Details  Name: Jeremy [name] MRN: [id number] Date of Birth: 01-03-1961 Referring Provider: Dorna Leitz MD  Encounter Date: 10/31/2016      PT End of Session - 10/31/16 0947    Visit Number 2   Number of Visits 16   Date for PT Re-Evaluation 12/25/16   PT Start Time 0900   PT Stop Time 0954   PT Time Calculation (min) 54 min   Activity Tolerance Patient tolerated treatment well   Behavior During Therapy [facility] for tasks assessed/performed      Past Medical History:  Diagnosis Date  . Arthritis    both knees   . GERD (gastroesophageal reflux disease)   . H/O renal calculi    has been passed spontaneously but also had cystoscopy for one   . Headache    migraines on occasion - used imitrex 5- 6 yrs. ago   . History of kidney stones    cystoscopy x1, also has past spontaneously  . Hypertension   . Irritable bowel syndrome (IBS)   . PE (pulmonary embolism) 1992   post op- 1 week  . Peripheral vascular disease ([place])    post op DVT-  approx. 2009, treated at [facility]   . Seasonal allergies     Past Surgical History:  Procedure Laterality Date  . ANKLE SURGERY Right    repair of fracture & repair of torn tendons.  Marland Kitchen CARDIAC CATHETERIZATION     diagnostic only  . KNEE SURGERY     8 arthroscopies & one repair of tendon- R   . PERIPHERALLY INSERTED CENTRAL CATHETER INSERTION Right 2001   staph infection in R knee, post surgical, treated with antibiotics    . TONSILLECTOMY    . TOTAL KNEE ARTHROPLASTY Left 03/05/2016   Procedure: LEFT TOTAL KNEE ARTHROPLASTY;  Surgeon: Dorna Leitz, MD;  Location: [place];  Service: Orthopedics;  Laterality: Left;  . TOTAL KNEE ARTHROPLASTY Right 10/22/2016  . TOTAL KNEE ARTHROPLASTY Right 10/22/2016   Procedure: TOTAL KNEE ARTHROPLASTY;  Surgeon: Dorna Leitz, MD;   Location: [place];  Service: Orthopedics;  Laterality: Right;  Marland Kitchen VOCAL CORD INJECTION  2002   bilateral medialization, implants on both vocal chords , done at [facility].     There were no vitals filed for this visit.      Subjective Assessment - 10/31/16 0906    Subjective My knee is sore and stiff.   Pain Score 8    Pain Location Knee   Pain Orientation Right   Pain Descriptors / Indicators Tightness;Aching   Pain Type Surgical pain                         [facility] Adult PT Treatment/Exercise - 10/31/16 0001      Exercises   Exercises Ankle     Knee/Hip Exercises: Aerobic   Nustep Level 3 x 15 minutes.     Knee/Hip Exercises: Standing   Rocker Board Limitations 3 minutes.     Modalities   Modalities Passenger transport manager Location --  Right knee.   Electrical Stimulation Action IFC   Electrical Stimulation Parameters 1-10 Hz x 15 minutes.   Electrical Stimulation Goals Edema;Pain     Vasopneumatic   Number Minutes Vasopneumatic  15 minutes   Vasopnuematic  Location  --  Right knee.   Vasopneumatic Pressure Medium     Manual Therapy   Manual Therapy Passive ROM   Passive ROM PROM x 10 minutes into righjt knee flexion and extension.                     PT Long Term Goals - 10/30/16 1505      PT LONG TERM GOAL #1   Title Ind with an advanced HEP.   Time 8   Period Weeks   Status New     PT LONG TERM GOAL #2   Title Active right  knee flexion to 115 degrees+ so the patient can perform functional tasks and do so with pain not > 2-3/10.   Time 8   Period Weeks   Status New     PT LONG TERM GOAL #3   Title Decrease edema to within 2 cms of non-affected side to assist with pain reduction and range of motion gains.   Time 8   Period Weeks   Status New     PT LONG TERM GOAL #4   Title Increase right knee strength to a solid 5/5 to provide good stability for  accomplishment of functional activities   Time 8   Period Weeks   Status New     PT LONG TERM GOAL #5   Title Perform a reciprocating stair gait with one railing with pain not > 2-3/10.   Time 8   Period Weeks   Status New               Plan - 10/30/16 1458    Clinical Impression Statement The patient currently presents a significant loss of right knee range of motion and a high pain-level.  He has a notable amount of edema as well.  He is using a FWW for safety.   Rehab Potential Excellent   PT Frequency 3x / week   PT Duration 8 weeks   PT Treatment/Interventions ADLs/Self Care Home Management;Cryotherapy;Electrical Stimulation;Therapeutic activities;Therapeutic exercise;Neuromuscular re-education;Patient/family education;Passive range of motion;Vasopneumatic Device   PT Next Visit Plan Nustep; PROM to right knee; SLR's; Rockerboard; Vasopneumatice and e'stim (after post-surgical dressing is doffed).      Patient will benefit from skilled therapeutic intervention in order to improve the following deficits and impairments:  Abnormal gait, Decreased range of motion, Decreased strength, Increased edema, Pain  Visit Diagnosis: Acute pain of right knee  Stiffness of right knee, not elsewhere classified  Localized edema     Problem List Patient Active Problem List   Diagnosis Date Noted  . History of pulmonary embolus (PE) 03/06/2016  . Primary osteoarthritis of right knee 03/05/2016    APPLE[place]ATE, [place] MPT 10/31/2016, 10:18 AM  [facility] [place], [place], [address] Phone: [phone]   Fax:  [phone]  Name: Jeremy [name] MRN: [id number] Date of Birth: 1961/03/09

## 2016-11-05 ENCOUNTER — Encounter: Payer: Self-pay | Admitting: Physical Therapy

## 2016-11-05 ENCOUNTER — Ambulatory Visit: Payer: PRIVATE HEALTH INSURANCE | Attending: Orthopedic Surgery | Admitting: Physical Therapy

## 2016-11-05 DIAGNOSIS — M25661 Stiffness of right knee, not elsewhere classified: Secondary | ICD-10-CM | POA: Insufficient documentation

## 2016-11-05 DIAGNOSIS — M6281 Muscle weakness (generalized): Secondary | ICD-10-CM | POA: Diagnosis present

## 2016-11-05 DIAGNOSIS — M25561 Pain in right knee: Secondary | ICD-10-CM | POA: Diagnosis present

## 2016-11-05 DIAGNOSIS — R600 Localized edema: Secondary | ICD-10-CM | POA: Insufficient documentation

## 2016-11-05 NOTE — Therapy (Signed)
[facility] [place], [place], [address] Phone: [phone]   Fax:  [phone]  Physical Therapy Treatment  Patient Details  Name: Jeremy [name] MRN: [id number] Date of Birth: 01/30/1961 Referring Provider: Dorna Leitz MD  Encounter Date: 11/05/2016      PT End of Session - 11/05/16 0735    Visit Number 3   Number of Visits 16   Date for PT Re-Evaluation 12/25/16   PT Start Time 0734   PT Stop Time 0825   PT Time Calculation (min) 51 min   Activity Tolerance Patient tolerated treatment well   Behavior During Therapy [facility] for tasks assessed/performed      Past Medical History:  Diagnosis Date  . Arthritis    both knees   . GERD (gastroesophageal reflux disease)   . H/O renal calculi    has been passed spontaneously but also had cystoscopy for one   . Headache    migraines on occasion - used imitrex 5- 6 yrs. ago   . History of kidney stones    cystoscopy x1, also has past spontaneously  . Hypertension   . Irritable bowel syndrome (IBS)   . PE (pulmonary embolism) 1992   post op- 1 week  . Peripheral vascular disease ([place])    post op DVT-  approx. 2009, treated at [facility]   . Seasonal allergies     Past Surgical History:  Procedure Laterality Date  . ANKLE SURGERY Right    repair of fracture & repair of torn tendons.  Marland Kitchen CARDIAC CATHETERIZATION     diagnostic only  . KNEE SURGERY     8 arthroscopies & one repair of tendon- R   . PERIPHERALLY INSERTED CENTRAL CATHETER INSERTION Right 2001   staph infection in R knee, post surgical, treated with antibiotics    . TONSILLECTOMY    . TOTAL KNEE ARTHROPLASTY Left 03/05/2016   Procedure: LEFT TOTAL KNEE ARTHROPLASTY;  Surgeon: Dorna Leitz, MD;  Location: [place];  Service: Orthopedics;  Laterality: Left;  . TOTAL KNEE ARTHROPLASTY Right 10/22/2016  . TOTAL KNEE ARTHROPLASTY Right 10/22/2016   Procedure: TOTAL KNEE ARTHROPLASTY;  Surgeon: Dorna Leitz, MD;   Location: [place];  Service: Orthopedics;  Laterality: Right;  Marland Kitchen VOCAL CORD INJECTION  2002   bilateral medialization, implants on both vocal chords , done at [facility].     There were no vitals filed for this visit.      Subjective Assessment - 11/05/16 0735    Subjective Reports that he thinks the bandage over the incision is what is causing lack of flexion.   Limitations Walking   How long can you walk comfortably? Short distance.   Patient Stated Goals Get back to normal.   Currently in Pain? Yes   Pain Score 7    Pain Location Knee   Pain Orientation Right   Pain Descriptors / Indicators Tightness;Aching   Pain Type Surgical pain            [facility] PT Assessment - 11/05/16 0001      Assessment   Medical Diagnosis Right total knee replacement.   Onset Date/Surgical Date 10/22/16   Next MD Visit 11/08/2016     Restrictions   Weight Bearing Restrictions No                     [facility] Adult PT Treatment/Exercise - 11/05/16 0001      Knee/Hip Exercises: Stretches   Active Hamstring Stretch  Right;3 reps;20 seconds     Knee/Hip Exercises: Aerobic   Nustep L3, seat 11 x10 min     Knee/Hip Exercises: Standing   Forward Lunges Right;2 sets;10 reps;5 seconds   Rocker Board 3 minutes     Knee/Hip Exercises: Supine   Short Arc Quad Sets Strengthening;Right;2 sets;5 reps   Short Arc Quad Sets Limitations 3#   Straight Leg Raises Strengthening;Right;2 sets;10 reps     Knee/Hip Exercises: Sidelying   Hip ABduction AROM;Right;2 sets;10 reps     Modalities   Modalities Teacher, early years/pre Action IFC   Electrical Stimulation Parameters 1-10 hz x15 min   Electrical Stimulation Goals Edema;Pain     Vasopneumatic   Number Minutes Vasopneumatic  15 minutes   Vasopnuematic Location  Knee   Vasopneumatic Pressure Medium   Vasopneumatic Temperature   34     Manual Therapy   Manual Therapy Passive ROM   Passive ROM PROM of R knee into flexion/extension to increase R knee ROM                     PT Long Term Goals - 10/30/16 1505      PT LONG TERM GOAL #1   Title Ind with an advanced HEP.   Time 8   Period Weeks   Status New     PT LONG TERM GOAL #2   Title Active right  knee flexion to 115 degrees+ so the patient can perform functional tasks and do so with pain not > 2-3/10.   Time 8   Period Weeks   Status New     PT LONG TERM GOAL #3   Title Decrease edema to within 2 cms of non-affected side to assist with pain reduction and range of motion gains.   Time 8   Period Weeks   Status New     PT LONG TERM GOAL #4   Title Increase right knee strength to a solid 5/5 to provide good stability for accomplishment of functional activities   Time 8   Period Weeks   Status New     PT LONG TERM GOAL #5   Title Perform a reciprocating stair gait with one railing with pain not > 2-3/10.   Time 8   Period Weeks   Status New               Plan - 11/05/16 0737    Clinical Impression Statement Patient presented in clinic with post-surgical dressing still donned over R knee incision. Patient ambulated without AD but experienced high level R knee pain as he has not taken pain medication this morning. Patient limited with exercises due to the presence of the R knee post-surgical dressing that limited flexion ROM. Patient experienced a good amount of stretch with standing R HS stretch especially at distal attachments. Patient able to complete supine exercises fairly well today although he initially had difficulty with SAQs. Patient limited with PROM of R knee into both flexion and extension due to discomfort. Normal modalities response noted following removal of the modalities. Goals remain on-going secondary to limitations.   Rehab Potential Excellent   PT Frequency 3x / week   PT Duration 8 weeks   PT  Treatment/Interventions ADLs/Self Care Home Management;Cryotherapy;Electrical Stimulation;Therapeutic activities;Therapeutic exercise;Neuromuscular re-education;Patient/family education;Passive range of motion;Vasopneumatic Device   PT Next Visit Plan Nustep; PROM to right knee; SLR's; Rockerboard; Vasopneumatice and  e'stim (after post-surgical dressing is doffed).   Consulted and Agree with Plan of Care Patient      Patient will benefit from skilled therapeutic intervention in order to improve the following deficits and impairments:  Abnormal gait, Decreased range of motion, Decreased strength, Increased edema, Pain  Visit Diagnosis: Acute pain of right knee  Stiffness of right knee, not elsewhere classified  Localized edema  Muscle weakness (generalized)     Problem List Patient Active Problem List   Diagnosis Date Noted  . History of pulmonary embolus (PE) 03/06/2016  . Primary osteoarthritis of right knee 03/05/2016    Wynelle Fanny, PTA 11/05/2016, 8:26 AM  [facility] [place], [place], [address] Phone: [phone]   Fax:  [phone]  Name: Jeremy [name] MRN: [id number] Date of Birth: Apr 14, 1961

## 2016-11-07 ENCOUNTER — Ambulatory Visit: Payer: PRIVATE HEALTH INSURANCE | Admitting: Physical Therapy

## 2016-11-07 ENCOUNTER — Encounter: Payer: Self-pay | Admitting: Physical Therapy

## 2016-11-07 DIAGNOSIS — M25561 Pain in right knee: Secondary | ICD-10-CM | POA: Diagnosis not present

## 2016-11-07 DIAGNOSIS — M25661 Stiffness of right knee, not elsewhere classified: Secondary | ICD-10-CM

## 2016-11-07 DIAGNOSIS — M6281 Muscle weakness (generalized): Secondary | ICD-10-CM

## 2016-11-07 DIAGNOSIS — R600 Localized edema: Secondary | ICD-10-CM

## 2016-11-07 NOTE — Therapy (Signed)
[facility] [place], [place], [address] Phone: [phone]   Fax:  [phone]  Physical Therapy Treatment  Patient Details  Name: Jeremy [name] MRN: [id number] Date of Birth: Dec 02, 1961 Referring Provider: Dorna Leitz MD  Encounter Date: 11/07/2016      PT End of Session - 11/07/16 0742    Visit Number 4   Number of Visits 16   Date for PT Re-Evaluation 12/25/16   PT Start Time 0730   PT Stop Time 0820   PT Time Calculation (min) 50 min   Activity Tolerance Patient tolerated treatment well   Behavior During Therapy [facility] for tasks assessed/performed      Past Medical History:  Diagnosis Date  . Arthritis    both knees   . GERD (gastroesophageal reflux disease)   . H/O renal calculi    has been passed spontaneously but also had cystoscopy for one   . Headache    migraines on occasion - used imitrex 5- 6 yrs. ago   . History of kidney stones    cystoscopy x1, also has past spontaneously  . Hypertension   . Irritable bowel syndrome (IBS)   . PE (pulmonary embolism) 1992   post op- 1 week  . Peripheral vascular disease ([place])    post op DVT-  approx. 2009, treated at [facility]   . Seasonal allergies     Past Surgical History:  Procedure Laterality Date  . ANKLE SURGERY Right    repair of fracture & repair of torn tendons.  Marland Kitchen CARDIAC CATHETERIZATION     diagnostic only  . KNEE SURGERY     8 arthroscopies & one repair of tendon- R   . PERIPHERALLY INSERTED CENTRAL CATHETER INSERTION Right 2001   staph infection in R knee, post surgical, treated with antibiotics    . TONSILLECTOMY    . TOTAL KNEE ARTHROPLASTY Left 03/05/2016   Procedure: LEFT TOTAL KNEE ARTHROPLASTY;  Surgeon: Dorna Leitz, MD;  Location: [place];  Service: Orthopedics;  Laterality: Left;  . TOTAL KNEE ARTHROPLASTY Right 10/22/2016  . TOTAL KNEE ARTHROPLASTY Right 10/22/2016   Procedure: TOTAL KNEE ARTHROPLASTY;  Surgeon: Dorna Leitz, MD;   Location: [place];  Service: Orthopedics;  Laterality: Right;  Marland Kitchen VOCAL CORD INJECTION  2002   bilateral medialization, implants on both vocal chords , done at [facility].     There were no vitals filed for this visit.      Subjective Assessment - 11/07/16 0741    Subjective Reports that he used CMS machine yesterday and reports that he is very sore today. Reports that he ran out of pain medication yesterday and that ibuprofen doesn't cut it.   Limitations Walking   How long can you walk comfortably? Short distance.   Patient Stated Goals Get back to normal.   Currently in Pain? Yes   Pain Score 8    Pain Location Knee   Pain Orientation Right   Pain Descriptors / Indicators Sore   Pain Type Surgical pain            [facility] PT Assessment - 11/07/16 0001      Assessment   Medical Diagnosis Right total knee replacement.   Onset Date/Surgical Date 10/22/16   Next MD Visit 11/08/2016     Restrictions   Weight Bearing Restrictions No     ROM / Strength   AROM / PROM / Strength AROM     AROM   Overall AROM  Deficits  AROM Assessment Site Knee   Right/Left Knee Right   Right Knee Extension -17   Right Knee Flexion 64                     [facility] Adult PT Treatment/Exercise - 11/07/16 0001      Knee/Hip Exercises: Stretches   Active Hamstring Stretch Right;3 reps;30 seconds     Knee/Hip Exercises: Aerobic   Nustep L4, seat 11 x11 min     Knee/Hip Exercises: Standing   Hip Flexion AROM;Right;2 sets;10 reps;Knee bent   Forward Lunges Right;2 sets;10 reps;5 seconds   Hip Abduction AROM;Right;2 sets;10 reps;Knee straight   Rocker Board 3 minutes     Knee/Hip Exercises: Supine   Short Arc Quad Sets Strengthening;Right;2 sets;10 reps   Short Arc Quad Sets Limitations 3# 5 sec hold   Straight Leg Raises Strengthening;Right;2 sets;10 reps     Knee/Hip Exercises: Sidelying   Hip ABduction AROM;Right;2 sets;10 reps     Modalities   Modalities Film/video editor Action IFC   Electrical Stimulation Parameters 1-10 hz x15 min   Electrical Stimulation Goals Edema;Pain     Vasopneumatic   Number Minutes Vasopneumatic  15 minutes   Vasopnuematic Location  Knee   Vasopneumatic Pressure Medium   Vasopneumatic Temperature  54                     PT Long Term Goals - 11/07/16 0743      PT LONG TERM GOAL #1   Title Ind with an advanced HEP.   Time 8   Period Weeks   Status On-going     PT LONG TERM GOAL #2   Title Active right  knee flexion to 115 degrees+ so the patient can perform functional tasks and do so with pain not > 2-3/10.   Time 8   Period Weeks   Status On-going     PT LONG TERM GOAL #3   Title Decrease edema to within 2 cms of non-affected side to assist with pain reduction and range of motion gains.   Time 8   Period Weeks   Status On-going     PT LONG TERM GOAL #4   Title Increase right knee strength to a solid 5/5 to provide good stability for accomplishment of functional activities   Time 8   Period Weeks   Status On-going     PT LONG TERM GOAL #5   Title Perform a reciprocating stair gait with one railing with pain not > 2-3/10.   Time 8   Period Weeks   Status On-going               Plan - 11/07/16 0810    Clinical Impression Statement Patient presented in clinic with reports of increased soreness as he has ran out of pain medication. Patient presented with post-surgical dressing donned over R knee and without AD. Patient limited by discomfort and tightness today with exercises and with ROM. Patient's R knee AROM measured as 17-64 deg in supine. Normal modalities response noted following removal of the modalities.   Rehab Potential Excellent   PT Frequency 3x / week   PT Duration 8 weeks   PT Treatment/Interventions ADLs/Self Care Home Management;Cryotherapy;Electrical  Stimulation;Therapeutic activities;Therapeutic exercise;Neuromuscular re-education;Patient/family education;Passive range of motion;Vasopneumatic Device   PT Next Visit Plan Continue per R TKR protocol with PROM and modalities  for pain/edema per MPT POC.   Consulted and Agree with Plan of Care Patient      Patient will benefit from skilled therapeutic intervention in order to improve the following deficits and impairments:  Abnormal gait, Decreased range of motion, Decreased strength, Increased edema, Pain  Visit Diagnosis: Acute pain of right knee  Stiffness of right knee, not elsewhere classified  Localized edema  Muscle weakness (generalized)     Problem List Patient Active Problem List   Diagnosis Date Noted  . History of pulmonary embolus (PE) 03/06/2016  . Primary osteoarthritis of right knee 03/05/2016    Ahmed Prima, PTA 11/07/16 10:44 AM [place]i [place] MPT [facility] [place], [place], [address] Phone: [phone]   Fax:  [phone]  Name: Jeremy [name] MRN: [id number] Date of Birth: 10/19/1961

## 2016-11-09 ENCOUNTER — Ambulatory Visit: Payer: PRIVATE HEALTH INSURANCE | Admitting: Physical Therapy

## 2016-11-09 ENCOUNTER — Encounter: Payer: Self-pay | Admitting: Physical Therapy

## 2016-11-09 DIAGNOSIS — M25561 Pain in right knee: Secondary | ICD-10-CM | POA: Diagnosis not present

## 2016-11-09 DIAGNOSIS — M6281 Muscle weakness (generalized): Secondary | ICD-10-CM

## 2016-11-09 DIAGNOSIS — M25661 Stiffness of right knee, not elsewhere classified: Secondary | ICD-10-CM

## 2016-11-09 DIAGNOSIS — R600 Localized edema: Secondary | ICD-10-CM

## 2016-11-09 NOTE — Therapy (Signed)
[facility] [place], [place], [address] Phone: [phone]   Fax:  [phone]  Physical Therapy Treatment  Patient Details  Name: Jeremy [name] MRN: [id number] Date of Birth: 1961-03-04 Referring Provider: Dorna Leitz MD  Encounter Date: 11/09/2016      PT End of Session - 11/09/16 0736    Visit Number 5   Number of Visits 16   Date for PT Re-Evaluation 12/25/16   PT Start Time 0734   PT Stop Time 0827   PT Time Calculation (min) 53 min   Activity Tolerance Patient tolerated treatment well   Behavior During Therapy [facility] for tasks assessed/performed      Past Medical History:  Diagnosis Date  . Arthritis    both knees   . GERD (gastroesophageal reflux disease)   . H/O renal calculi    has been passed spontaneously but also had cystoscopy for one   . Headache    migraines on occasion - used imitrex 5- 6 yrs. ago   . History of kidney stones    cystoscopy x1, also has past spontaneously  . Hypertension   . Irritable bowel syndrome (IBS)   . PE (pulmonary embolism) 1992   post op- 1 week  . Peripheral vascular disease ([place])    post op DVT-  approx. 2009, treated at [facility]   . Seasonal allergies     Past Surgical History:  Procedure Laterality Date  . ANKLE SURGERY Right    repair of fracture & repair of torn tendons.  Marland Kitchen CARDIAC CATHETERIZATION     diagnostic only  . KNEE SURGERY     8 arthroscopies & one repair of tendon- R   . PERIPHERALLY INSERTED CENTRAL CATHETER INSERTION Right 2001   staph infection in R knee, post surgical, treated with antibiotics    . TONSILLECTOMY    . TOTAL KNEE ARTHROPLASTY Left 03/05/2016   Procedure: LEFT TOTAL KNEE ARTHROPLASTY;  Surgeon: Dorna Leitz, MD;  Location: [place];  Service: Orthopedics;  Laterality: Left;  . TOTAL KNEE ARTHROPLASTY Right 10/22/2016  . TOTAL KNEE ARTHROPLASTY Right 10/22/2016   Procedure: TOTAL KNEE ARTHROPLASTY;  Surgeon: Dorna Leitz, MD;   Location: [place];  Service: Orthopedics;  Laterality: Right;  Marland Kitchen VOCAL CORD INJECTION  2002   bilateral medialization, implants on both vocal chords , done at [facility].     There were no vitals filed for this visit.      Subjective Assessment - 11/09/16 0734    Subjective Reports that he got his bandage removed yesterday although a bandaid in place in superior knee where they drew out 45 cc's of blood per patient report. Patient reports no driving per MD.   Limitations Walking   How long can you walk comfortably? Short distance.   Patient Stated Goals Get back to normal.   Currently in Pain? Yes   Pain Score 8    Pain Location Knee   Pain Orientation Right   Pain Descriptors / Indicators Aching;Throbbing;Tightness;Sore   Pain Type Surgical pain            [facility] PT Assessment - 11/09/16 0001      Assessment   Medical Diagnosis Right total knee replacement.   Onset Date/Surgical Date 10/22/16   Next MD Visit 11/22/2016     Restrictions   Weight Bearing Restrictions No     Observation/Other Assessments-Edema    Edema Circumferential     Circumferential Edema   Circumferential - Right 42.3  cm   Circumferential - Left  41.8 cm                     [facility] Adult PT Treatment/Exercise - 11/09/16 0001      Knee/Hip Exercises: Stretches   Active Hamstring Stretch Right;5 reps;10 seconds     Knee/Hip Exercises: Aerobic   Nustep L5, seat 13 x10 min     Knee/Hip Exercises: Standing   Forward Lunges Right;2 sets;10 reps;5 seconds   Rocker Board 3 minutes     Knee/Hip Exercises: Supine   Short Arc Quad Sets Strengthening;Right;2 sets;10 reps   Short Arc Quad Sets Limitations 3# 5 sec hold   Straight Leg Raises Strengthening;Right;2 sets;10 reps     Knee/Hip Exercises: Sidelying   Hip ABduction AROM;Right;2 sets;10 reps     Modalities   Modalities Statistician Action IFC   Electrical Stimulation Parameters 1-10 hz x15 min   Electrical Stimulation Goals Edema;Pain     Vasopneumatic   Number Minutes Vasopneumatic  15 minutes   Vasopnuematic Location  Knee   Vasopneumatic Pressure Medium   Vasopneumatic Temperature  56     Manual Therapy   Manual Therapy Passive ROM;Soft tissue mobilization   Soft tissue mobilization R patellar mobilizations sup/inf, lat/med to promote proper mobility                      PT Long Term Goals - 11/09/16 0817      PT LONG TERM GOAL #1   Title Ind with an advanced HEP.   Time 8   Period Weeks   Status On-going     PT LONG TERM GOAL #2   Title Active right  knee flexion to 115 degrees+ so the patient can perform functional tasks and do so with pain not > 2-3/10.   Time 8   Period Weeks   Status On-going     PT LONG TERM GOAL #3   Title Decrease edema to within 2 cms of non-affected side to assist with pain reduction and range of motion gains.   Time 8   Period Weeks   Status Achieved  0.5 cm difference with R > L 11/09/2016     PT LONG TERM GOAL #4   Title Increase right knee strength to a solid 5/5 to provide good stability for accomplishment of functional activities   Time 8   Period Weeks   Status On-going     PT LONG TERM GOAL #5   Title Perform a reciprocating stair gait with one railing with pain not > 2-3/10.   Time 8   Period Weeks   Status On-going               Plan - 11/09/16 0818    Clinical Impression Statement Patient presented in clinic with no post-surgical dressing over R knee incision but continued to have stiffness and discomfort as he is still catching back up with the pain with pain medications. Patient limited in regards to both flexion and extension of the R knee with increased stretch noted with HS stretch and PROM into flexion. Patient able to complete all exercises as directed in slow pace secondary to discomfort and  tightness of R knee. SLR completed with extensor lag and no full SAQ at this time. Good patellar mobility in all directions assessed with edema palpated all around R  patella. Normal modalities response noted following removal of the modalities.    Rehab Potential Excellent   PT Frequency 3x / week   PT Duration 8 weeks   PT Treatment/Interventions ADLs/Self Care Home Management;Cryotherapy;Electrical Stimulation;Therapeutic activities;Therapeutic exercise;Neuromuscular re-education;Patient/family education;Passive range of motion;Vasopneumatic Device   PT Next Visit Plan Continue per R TKR protocol with PROM and modalities for pain/edema per MPT POC.   Consulted and Agree with Plan of Care Patient      Patient will benefit from skilled therapeutic intervention in order to improve the following deficits and impairments:  Abnormal gait, Decreased range of motion, Decreased strength, Increased edema, Pain  Visit Diagnosis: Acute pain of right knee  Stiffness of right knee, not elsewhere classified  Localized edema  Muscle weakness (generalized)     Problem List Patient Active Problem List   Diagnosis Date Noted  . History of pulmonary embolus (PE) 03/06/2016  . Primary osteoarthritis of right knee 03/05/2016    Wynelle Fanny, PTA 11/09/2016, 8:29 AM  [facility] [place], [place], [address] Phone: [phone]   Fax:  [phone]  Name: OTHO RODELA MRN: [id number] Date of Birth: 1961/07/31

## 2016-11-12 ENCOUNTER — Encounter: Payer: Self-pay | Admitting: Physical Therapy

## 2016-11-12 ENCOUNTER — Ambulatory Visit: Payer: PRIVATE HEALTH INSURANCE | Admitting: Physical Therapy

## 2016-11-12 DIAGNOSIS — M6281 Muscle weakness (generalized): Secondary | ICD-10-CM

## 2016-11-12 DIAGNOSIS — M25561 Pain in right knee: Secondary | ICD-10-CM | POA: Diagnosis not present

## 2016-11-12 DIAGNOSIS — R600 Localized edema: Secondary | ICD-10-CM

## 2016-11-12 DIAGNOSIS — M25661 Stiffness of right knee, not elsewhere classified: Secondary | ICD-10-CM

## 2016-11-12 NOTE — Therapy (Signed)
[facility] [place], [place], [address] Phone: [phone]   Fax:  [phone]  Physical Therapy Treatment  Patient Details  Name: Jeremy [name] MRN: [id number] Date of Birth: 12/20/1960 Referring Provider: Dorna Leitz MD  Encounter Date: 11/12/2016      PT End of Session - 11/12/16 0740    Visit Number 6   Number of Visits 16   Date for PT Re-Evaluation 12/25/16   PT Start Time 0739   PT Stop Time 0824   PT Time Calculation (min) 45 min   Activity Tolerance Patient tolerated treatment well   Behavior During Therapy [facility] for tasks assessed/performed      Past Medical History:  Diagnosis Date  . Arthritis    both knees   . GERD (gastroesophageal reflux disease)   . H/O renal calculi    has been passed spontaneously but also had cystoscopy for one   . Headache    migraines on occasion - used imitrex 5- 6 yrs. ago   . History of kidney stones    cystoscopy x1, also has past spontaneously  . Hypertension   . Irritable bowel syndrome (IBS)   . PE (pulmonary embolism) 1992   post op- 1 week  . Peripheral vascular disease ([place])    post op DVT-  approx. 2009, treated at [facility]   . Seasonal allergies     Past Surgical History:  Procedure Laterality Date  . ANKLE SURGERY Right    repair of fracture & repair of torn tendons.  Marland Kitchen CARDIAC CATHETERIZATION     diagnostic only  . KNEE SURGERY     8 arthroscopies & one repair of tendon- R   . PERIPHERALLY INSERTED CENTRAL CATHETER INSERTION Right 2001   staph infection in R knee, post surgical, treated with antibiotics    . TONSILLECTOMY    . TOTAL KNEE ARTHROPLASTY Left 03/05/2016   Procedure: LEFT TOTAL KNEE ARTHROPLASTY;  Surgeon: Dorna Leitz, MD;  Location: [place];  Service: Orthopedics;  Laterality: Left;  . TOTAL KNEE ARTHROPLASTY Right 10/22/2016  . TOTAL KNEE ARTHROPLASTY Right 10/22/2016   Procedure: TOTAL KNEE ARTHROPLASTY;  Surgeon: Dorna Leitz, MD;   Location: [place];  Service: Orthopedics;  Laterality: Right;  Marland Kitchen VOCAL CORD INJECTION  2002   bilateral medialization, implants on both vocal chords , done at [facility].     There were no vitals filed for this visit.      Subjective Assessment - 11/12/16 0739    Subjective Reports that he has been doing the CSM machine over the weekend. Reports that right across the superior R knee that is very tight. Reports overall R knee tightness.   Limitations Walking   How long can you walk comfortably? Short distance.   Patient Stated Goals Get back to normal.   Currently in Pain? Yes   Pain Score 8    Pain Location Knee   Pain Orientation Right;Upper   Pain Descriptors / Indicators Tightness   Pain Type Surgical pain            [facility] PT Assessment - 11/12/16 0001      Assessment   Medical Diagnosis Right total knee replacement.   Onset Date/Surgical Date 10/22/16   Next MD Visit 11/22/2016     Restrictions   Weight Bearing Restrictions No                     [facility] Adult PT Treatment/Exercise - 11/12/16 0001  Knee/Hip Exercises: Stretches   Active Hamstring Stretch Right;5 reps;10 seconds     Knee/Hip Exercises: Aerobic   Nustep L4, seat 11 x11 min     Knee/Hip Exercises: Standing   Forward Lunges Right;2 sets;10 reps;5 seconds   Rocker Board 2 minutes     Knee/Hip Exercises: Supine   Short Arc Quad Sets AROM;Right;2 sets;10 reps   Short Arc [employer] Limitations 5 sec hold     Modalities   Modalities Financial risk analyst IFC   Electrical Stimulation Parameters 1-10 hz x15 mi   Electrical Stimulation Goals Edema;Pain     Vasopneumatic   Number Minutes Vasopneumatic  15 minutes   Vasopnuematic Location  Knee   Vasopneumatic Pressure Medium   Vasopneumatic Temperature  58     Manual Therapy   Manual Therapy Soft tissue  mobilization   Soft tissue mobilization STW to R distal Quad, ITB, adductors and incision to decrease tightness and promote proper mobility                     PT Long Term Goals - 11/09/16 0817      PT LONG TERM GOAL #1   Title Ind with an advanced HEP.   Time 8   Period Weeks   Status On-going     PT LONG TERM GOAL #2   Title Active right  knee flexion to 115 degrees+ so the patient can perform functional tasks and do so with pain not > 2-3/10.   Time 8   Period Weeks   Status On-going     PT LONG TERM GOAL #3   Title Decrease edema to within 2 cms of non-affected side to assist with pain reduction and range of motion gains.   Time 8   Period Weeks   Status Achieved  0.5 cm difference with R > L 11/09/2016     PT LONG TERM GOAL #4   Title Increase right knee strength to a solid 5/5 to provide good stability for accomplishment of functional activities   Time 8   Period Weeks   Status On-going     PT LONG TERM GOAL #5   Title Perform a reciprocating stair gait with one railing with pain not > 2-3/10.   Time 8   Period Weeks   Status On-going               Plan - 11/12/16 [id number]    Clinical Impression Statement Patient presented in clinic with continued R knee tightness especially along superior R knee. Patient limited in R knee ROM again secondary to the tightness present and with exercises. Tightness palpated along R distal Quad, ITB, and adductors especially along R lateral knee. Fairly good R incision mobility noted with incision mobilizations today. Normal modalities response noted following removal of the modalities. Patient educated to continue elevation and to ice with elevation to assist with tightness of R knee until next MD appointment.   Rehab Potential Excellent   PT Frequency 3x / week   PT Duration 8 weeks   PT Treatment/Interventions ADLs/Self Care Home Management;Cryotherapy;Electrical Stimulation;Therapeutic activities;Therapeutic  exercise;Neuromuscular re-education;Patient/family education;Passive range of motion;Vasopneumatic Device   PT Next Visit Plan Continue per R TKR protocol with PROM and modalities for pain/edema per MPT POC.   Consulted and Agree with Plan of Care Patient      Patient will benefit from skilled therapeutic  intervention in order to improve the following deficits and impairments:  Abnormal gait, Decreased range of motion, Decreased strength, Increased edema, Pain  Visit Diagnosis: Acute pain of right knee  Stiffness of right knee, not elsewhere classified  Localized edema  Muscle weakness (generalized)     Problem List Patient Active Problem List   Diagnosis Date Noted  . History of pulmonary embolus (PE) 03/06/2016  . Primary osteoarthritis of right knee 03/05/2016    Wynelle Fanny, PTA 11/12/2016, 8:25 AM  [facility] [place], [place], [address] Phone: [phone]   Fax:  [phone]  Name: Jeremy [name] MRN: [id number] Date of Birth: [birth year]

## 2016-11-14 ENCOUNTER — Ambulatory Visit: Payer: PRIVATE HEALTH INSURANCE | Admitting: Physical Therapy

## 2016-11-14 ENCOUNTER — Encounter: Payer: Self-pay | Admitting: Physical Therapy

## 2016-11-14 DIAGNOSIS — M6281 Muscle weakness (generalized): Secondary | ICD-10-CM

## 2016-11-14 DIAGNOSIS — R600 Localized edema: Secondary | ICD-10-CM

## 2016-11-14 DIAGNOSIS — M25561 Pain in right knee: Secondary | ICD-10-CM | POA: Diagnosis not present

## 2016-11-14 DIAGNOSIS — M25661 Stiffness of right knee, not elsewhere classified: Secondary | ICD-10-CM

## 2016-11-14 NOTE — Therapy (Signed)
[facility] [place], [place], [address] Phone: [phone]   Fax:  [phone]  Physical Therapy Treatment  Patient Details  Name: Jeremy [name] MRN: [id number] Date of Birth: 05/18/1961 Referring Provider: Dorna Leitz MD  Encounter Date: 11/14/2016      PT End of Session - 11/14/16 0741    Visit Number 7   Number of Visits 16   Date for PT Re-Evaluation 12/25/16   PT Start Time 0738   PT Stop Time 0831   PT Time Calculation (min) 53 min   Activity Tolerance Patient tolerated treatment well   Behavior During Therapy [facility] for tasks assessed/performed      Past Medical History:  Diagnosis Date  . Arthritis    both knees   . GERD (gastroesophageal reflux disease)   . H/O renal calculi    has been passed spontaneously but also had cystoscopy for one   . Headache    migraines on occasion - used imitrex 5- 6 yrs. ago   . History of kidney stones    cystoscopy x1, also has past spontaneously  . Hypertension   . Irritable bowel syndrome (IBS)   . PE (pulmonary embolism) 1992   post op- 1 week  . Peripheral vascular disease ([place])    post op DVT-  approx. 2009, treated at [facility]   . Seasonal allergies     Past Surgical History:  Procedure Laterality Date  . ANKLE SURGERY Right    repair of fracture & repair of torn tendons.  Marland Kitchen CARDIAC CATHETERIZATION     diagnostic only  . KNEE SURGERY     8 arthroscopies & one repair of tendon- R   . PERIPHERALLY INSERTED CENTRAL CATHETER INSERTION Right 2001   staph infection in R knee, post surgical, treated with antibiotics    . TONSILLECTOMY    . TOTAL KNEE ARTHROPLASTY Left 03/05/2016   Procedure: LEFT TOTAL KNEE ARTHROPLASTY;  Surgeon: Dorna Leitz, MD;  Location: [place];  Service: Orthopedics;  Laterality: Left;  . TOTAL KNEE ARTHROPLASTY Right 10/22/2016  . TOTAL KNEE ARTHROPLASTY Right 10/22/2016   Procedure: TOTAL KNEE ARTHROPLASTY;  Surgeon: Dorna Leitz, MD;   Location: [place];  Service: Orthopedics;  Laterality: Right;  Marland Kitchen VOCAL CORD INJECTION  2002   bilateral medialization, implants on both vocal chords , done at [facility].     There were no vitals filed for this visit.      Subjective Assessment - 11/14/16 0739    Subjective Reports that his knee popped twice this morning but still remains tight and sore. Reports completing prone hangs and SAQ at home yesterday.   Limitations Walking   How long can you walk comfortably? Short distance.   Patient Stated Goals Get back to normal.   Currently in Pain? Yes   Pain Score 8    Pain Location Knee   Pain Orientation Right   Pain Descriptors / Indicators Sore;Tightness   Pain Type Surgical pain            [facility] PT Assessment - 11/14/16 0001      Assessment   Medical Diagnosis Right total knee replacement.   Onset Date/Surgical Date 10/22/16   Next MD Visit 11/22/2016     Restrictions   Weight Bearing Restrictions No                     [facility] Adult PT Treatment/Exercise - 11/14/16 0001      Knee/Hip  Exercises: Stretches   Active Hamstring Stretch Right;5 reps;10 seconds     Knee/Hip Exercises: Aerobic   Nustep L5, seat 12 x10 min     Knee/Hip Exercises: Standing   Forward Lunges Right;2 sets;10 reps;5 seconds   Rocker Board 3 minutes     Knee/Hip Exercises: Supine   Short Arc Quad Sets Strengthening;Right;2 sets;10 reps   Short Arc Quad Sets Limitations 3#      Modalities   Modalities Water quality scientist R knee   Chartered certified accountant IFC   Electrical Stimulation Parameters 1-10 hz x15 min   Electrical Stimulation Goals Edema;Pain     Vasopneumatic   Number Minutes Vasopneumatic  15 minutes   Vasopnuematic Location  Knee   Vasopneumatic Pressure Medium   Vasopneumatic Temperature  34     Manual Therapy   Manual Therapy Passive ROM   Passive ROM PROM of R knee  into flexion/extension to increase R knee ROM                     PT Long Term Goals - 11/09/16 0817      PT LONG TERM GOAL #1   Title Ind with an advanced HEP.   Time 8   Period Weeks   Status On-going     PT LONG TERM GOAL #2   Title Active right  knee flexion to 115 degrees+ so the patient can perform functional tasks and do so with pain not > 2-3/10.   Time 8   Period Weeks   Status On-going     PT LONG TERM GOAL #3   Title Decrease edema to within 2 cms of non-affected side to assist with pain reduction and range of motion gains.   Time 8   Period Weeks   Status Achieved  0.5 cm difference with R > L 11/09/2016     PT LONG TERM GOAL #4   Title Increase right knee strength to a solid 5/5 to provide good stability for accomplishment of functional activities   Time 8   Period Weeks   Status On-going     PT LONG TERM GOAL #5   Title Perform a reciprocating stair gait with one railing with pain not > 2-3/10.   Time 8   Period Weeks   Status On-going               Plan - 11/14/16 0815    Clinical Impression Statement Patient presented in clinic with continued R knee tightness and soreness. Patient limited again with stretches and ROM exercises secondary to tightness and discomfort although he was able to complete exercises as directed. Patient reported experiencing pull in R superiomedial knee with resisted SAQ. Patient limited with both flexion and extension in PROM with inflammation noted throughout R knee. Normal modalities response noted following removal of the modalities.   Rehab Potential Excellent   PT Frequency 3x / week   PT Duration 8 weeks   PT Treatment/Interventions ADLs/Self Care Home Management;Cryotherapy;Electrical Stimulation;Therapeutic activities;Therapeutic exercise;Neuromuscular re-education;Patient/family education;Passive range of motion;Vasopneumatic Device   PT Next Visit Plan Continue per R TKR protocol with PROM and  modalities for pain/edema per MPT POC.   Consulted and Agree with Plan of Care Patient      Patient will benefit from skilled therapeutic intervention in order to improve the following deficits and impairments:  Abnormal gait, Decreased range of motion, Decreased strength, Increased edema, Pain  Visit  Diagnosis: Acute pain of right knee  Stiffness of right knee, not elsewhere classified  Localized edema  Muscle weakness (generalized)     Problem List Patient Active Problem List   Diagnosis Date Noted  . History of pulmonary embolus (PE) 03/06/2016  . Primary osteoarthritis of right knee 03/05/2016    Wynelle Fanny, PTA 11/14/2016, 9:08 AM  [facility] [place], [place], [address] Phone: [phone]   Fax:  [phone]  Name: Jeremy [name] MRN: [id number] Date of Birth: November 24, 1961

## 2016-11-16 ENCOUNTER — Encounter: Payer: Self-pay | Admitting: Physical Therapy

## 2016-11-16 ENCOUNTER — Ambulatory Visit: Payer: PRIVATE HEALTH INSURANCE | Admitting: Physical Therapy

## 2016-11-16 DIAGNOSIS — R600 Localized edema: Secondary | ICD-10-CM

## 2016-11-16 DIAGNOSIS — M25661 Stiffness of right knee, not elsewhere classified: Secondary | ICD-10-CM

## 2016-11-16 DIAGNOSIS — M6281 Muscle weakness (generalized): Secondary | ICD-10-CM

## 2016-11-16 DIAGNOSIS — M25561 Pain in right knee: Secondary | ICD-10-CM | POA: Diagnosis not present

## 2016-11-16 NOTE — Patient Instructions (Addendum)
Stretching: Quadriceps    Laying on your stomach, bend your right foot into flexion and hold quad stretch with leash or belt. Hold __30__ seconds. Relax. Repeat __3__ times per set. Do ____ sets per session. Do __3-4__ sessions per day.  http://orth.exer.us/716   Copyright  VHI. All rights reserved.  Stretching: Hamstring (Supine)    Supporting right thigh behind knee, slowly straighten knee until stretch is felt in back of thigh or can be standing with foot on stool as done in clinic. Hold __30__ seconds. Repeat __3__ times per set. Do ____ sets per session. Do __3-4__ sessions per day.  http://orth.exer.us/656   Copyright  VHI. All rights reserved.  Forward Lunge    Standing with feet shoulder width apart and stomach tight, step forward with right leg. Repeat __10__ times per set. Do __2__ sets per session. Do _2-3___ sessions per day.  http://orth.exer.us/1146   Copyright  VHI. All rights reserved.

## 2016-11-16 NOTE — Therapy (Signed)
[facility] [place], [place], [address] Phone: [phone]   Fax:  [phone]  Physical Therapy Treatment  Patient Details  Name: Jeremy [name] MRN: [id number] Date of Birth: 03/16/1961 Referring Provider: Dorna Leitz MD  Encounter Date: 11/16/2016      PT End of Session - 11/16/16 0734    Visit Number 8   Number of Visits 16   Date for PT Re-Evaluation 12/25/16   PT Start Time 0734   PT Stop Time 0826   PT Time Calculation (min) 52 min   Activity Tolerance Patient tolerated treatment well   Behavior During Therapy [facility] for tasks assessed/performed      Past Medical History:  Diagnosis Date  . Arthritis    both knees   . GERD (gastroesophageal reflux disease)   . H/O renal calculi    has been passed spontaneously but also had cystoscopy for one   . Headache    migraines on occasion - used imitrex 5- 6 yrs. ago   . History of kidney stones    cystoscopy x1, also has past spontaneously  . Hypertension   . Irritable bowel syndrome (IBS)   . PE (pulmonary embolism) 1992   post op- 1 week  . Peripheral vascular disease ([place])    post op DVT-  approx. 2009, treated at [facility]   . Seasonal allergies     Past Surgical History:  Procedure Laterality Date  . ANKLE SURGERY Right    repair of fracture & repair of torn tendons.  Marland Kitchen CARDIAC CATHETERIZATION     diagnostic only  . KNEE SURGERY     8 arthroscopies & one repair of tendon- R   . PERIPHERALLY INSERTED CENTRAL CATHETER INSERTION Right 2001   staph infection in R knee, post surgical, treated with antibiotics    . TONSILLECTOMY    . TOTAL KNEE ARTHROPLASTY Left 03/05/2016   Procedure: LEFT TOTAL KNEE ARTHROPLASTY;  Surgeon: Dorna Leitz, MD;  Location: [place];  Service: Orthopedics;  Laterality: Left;  . TOTAL KNEE ARTHROPLASTY Right 10/22/2016  . TOTAL KNEE ARTHROPLASTY Right 10/22/2016   Procedure: TOTAL KNEE ARTHROPLASTY;  Surgeon: Dorna Leitz, MD;   Location: [place];  Service: Orthopedics;  Laterality: Right;  Marland Kitchen VOCAL CORD INJECTION  2002   bilateral medialization, implants on both vocal chords , done at [facility].     There were no vitals filed for this visit.      Subjective Assessment - 11/16/16 0734    Subjective Reports continued R knee tightness although he reports that he was on his feet a lot yesterday. Reports he slept in the bed last night and when he relaxed his knee straightened out.   Limitations Walking   How long can you walk comfortably? Short distance.   Patient Stated Goals Get back to normal.   Currently in Pain? Yes   Pain Score 7    Pain Location Knee   Pain Orientation Right   Pain Descriptors / Indicators Tightness   Pain Type Surgical pain            [facility] PT Assessment - 11/16/16 0001      Assessment   Medical Diagnosis Right total knee replacement.   Onset Date/Surgical Date 10/22/16   Next MD Visit 11/22/2016     Restrictions   Weight Bearing Restrictions No     Observation/Other Assessments-Edema    Edema Circumferential     Circumferential Edema   Circumferential - Right 42  cm   Circumferential - Left  41.3 cm     ROM / Strength   AROM / PROM / Strength AROM     AROM   Overall AROM  Deficits   AROM Assessment Site Knee   Right/Left Knee Right   Right Knee Extension -12   Right Knee Flexion 68                     [facility] Adult PT Treatment/Exercise - 11/16/16 0001      Knee/Hip Exercises: Stretches   Active Hamstring Stretch Right;3 reps;30 seconds   Quad Stretch Right;4 reps;20 seconds     Knee/Hip Exercises: Aerobic   Nustep L5, seat 12 x10 min     Knee/Hip Exercises: Standing   Forward Lunges Right;2 sets;10 reps;5 seconds   Forward Step Up Right;2 sets;10 reps;Hand Hold: 2;Step Height: 6"   Rocker Board 3 minutes     Knee/Hip Exercises: Supine   Short Arc Quad Sets Strengthening;Right;2 sets;10 reps   Short Arc Quad Sets Limitations 3# 5 sec hold      Modalities   Modalities Water quality scientist R knee   Printmaker Action IFC   Electrical Stimulation Parameters 1-10 hz x15 min   Electrical Stimulation Goals Edema;Pain     Vasopneumatic   Number Minutes Vasopneumatic  15 minutes   Vasopnuematic Location  Knee   Vasopneumatic Pressure Medium   Vasopneumatic Temperature  34                PT Education - 11/16/16 [id number]    Education provided Yes   Education Details HEP- quad and HS stretch, forward lunge   Person(s) Educated Patient   Methods Explanation;Demonstration;Verbal cues;Handout   Comprehension Verbalized understanding;Returned demonstration;Verbal cues required             PT Long Term Goals - 11/09/16 0817      PT LONG TERM GOAL #1   Title Ind with an advanced HEP.   Time 8   Period Weeks   Status On-going     PT LONG TERM GOAL #2   Title Active right  knee flexion to 115 degrees+ so the patient can perform functional tasks and do so with pain not > 2-3/10.   Time 8   Period Weeks   Status On-going     PT LONG TERM GOAL #3   Title Decrease edema to within 2 cms of non-affected side to assist with pain reduction and range of motion gains.   Time 8   Period Weeks   Status Achieved  0.5 cm difference with R > L 11/09/2016     PT LONG TERM GOAL #4   Title Increase right knee strength to a solid 5/5 to provide good stability for accomplishment of functional activities   Time 8   Period Weeks   Status On-going     PT LONG TERM GOAL #5   Title Perform a reciprocating stair gait with one railing with pain not > 2-3/10.   Time 8   Period Weeks   Status On-going               Plan - 11/16/16 [id number]    Clinical Impression Statement Patient presented in clinic today with continued R knee tightness. Patient reported tightness in R distal Quad especially with lunges today. Prone R Quad stretch  completed today in hopes of increasing ROM and decreasing tightness. Patient  continues to be very limited in regards to R knee ROM. AROM R knee measured as 12-68 deg in supine today. Patient provided with new stretching HEP in efforts to increase ROM and flexibility. Normal modalities response noted following removal of the modalities.   Rehab Potential Excellent   PT Frequency 3x / week   PT Duration 8 weeks   PT Treatment/Interventions ADLs/Self Care Home Management;Cryotherapy;Electrical Stimulation;Therapeutic activities;Therapeutic exercise;Neuromuscular re-education;Patient/family education;Passive range of motion;Vasopneumatic Device   PT Next Visit Plan Continue per R TKR protocol with PROM and modalities for pain/edema per MPT POC.   PT Home Exercise Plan HEP- Quad and HS stretch, lunges   Consulted and Agree with Plan of Care Patient      Patient will benefit from skilled therapeutic intervention in order to improve the following deficits and impairments:  Abnormal gait, Decreased range of motion, Decreased strength, Increased edema, Pain  Visit Diagnosis: Acute pain of right knee  Stiffness of right knee, not elsewhere classified  Localized edema  Muscle weakness (generalized)     Problem List Patient Active Problem List   Diagnosis Date Noted  . History of pulmonary embolus (PE) 03/06/2016  . Primary osteoarthritis of right knee 03/05/2016    Wynelle Fanny, PTA 11/16/2016, 8:50 AM  [facility] [place], [place], [address] Phone: [phone]   Fax:  [phone]  Name: Jeremy [name] MRN: [id number] Date of Birth: 04-01-1961

## 2016-11-19 ENCOUNTER — Ambulatory Visit: Payer: PRIVATE HEALTH INSURANCE | Admitting: Physical Therapy

## 2016-11-19 ENCOUNTER — Encounter: Payer: Self-pay | Admitting: Physical Therapy

## 2016-11-19 DIAGNOSIS — R600 Localized edema: Secondary | ICD-10-CM

## 2016-11-19 DIAGNOSIS — M6281 Muscle weakness (generalized): Secondary | ICD-10-CM

## 2016-11-19 DIAGNOSIS — M25561 Pain in right knee: Secondary | ICD-10-CM

## 2016-11-19 DIAGNOSIS — M25661 Stiffness of right knee, not elsewhere classified: Secondary | ICD-10-CM

## 2016-11-19 NOTE — Therapy (Signed)
[facility] [place], [place], [address] Phone: [phone]   Fax:  [phone]  Physical Therapy Treatment  Patient Details  Name: Jeremy [name] MRN: [id number] Date of Birth: 1961/10/21 Referring Provider: Dorna Leitz MD  Encounter Date: 11/19/2016      PT End of Session - 11/19/16 0750    Visit Number 9   Number of Visits 16   Date for PT Re-Evaluation 12/25/16   PT Start Time 0741   PT Stop Time 0831   PT Time Calculation (min) 50 min   Activity Tolerance Patient tolerated treatment well   Behavior During Therapy [facility] for tasks assessed/performed      Past Medical History:  Diagnosis Date  . Arthritis    both knees   . GERD (gastroesophageal reflux disease)   . H/O renal calculi    has been passed spontaneously but also had cystoscopy for one   . Headache    migraines on occasion - used imitrex 5- 6 yrs. ago   . History of kidney stones    cystoscopy x1, also has past spontaneously  . Hypertension   . Irritable bowel syndrome (IBS)   . PE (pulmonary embolism) 1992   post op- 1 week  . Peripheral vascular disease ([place])    post op DVT-  approx. 2009, treated at [facility]   . Seasonal allergies     Past Surgical History:  Procedure Laterality Date  . ANKLE SURGERY Right    repair of fracture & repair of torn tendons.  Marland Kitchen CARDIAC CATHETERIZATION     diagnostic only  . KNEE SURGERY     8 arthroscopies & one repair of tendon- R   . PERIPHERALLY INSERTED CENTRAL CATHETER INSERTION Right 2001   staph infection in R knee, post surgical, treated with antibiotics    . TONSILLECTOMY    . TOTAL KNEE ARTHROPLASTY Left 03/05/2016   Procedure: LEFT TOTAL KNEE ARTHROPLASTY;  Surgeon: Dorna Leitz, MD;  Location: [place];  Service: Orthopedics;  Laterality: Left;  . TOTAL KNEE ARTHROPLASTY Right 10/22/2016  . TOTAL KNEE ARTHROPLASTY Right 10/22/2016   Procedure: TOTAL KNEE ARTHROPLASTY;  Surgeon: Dorna Leitz, MD;   Location: [place];  Service: Orthopedics;  Laterality: Right;  Marland Kitchen VOCAL CORD INJECTION  2002   bilateral medialization, implants on both vocal chords , done at [facility].     There were no vitals filed for this visit.      Subjective Assessment - 11/19/16 0749    Subjective Reports some soreness as he completed his exercises over the weekend. Reports adapting to increased soreness of R knee.   Limitations Walking   How long can you walk comfortably? Short distance.   Patient Stated Goals Get back to normal.   Currently in Pain? Yes   Pain Score 7    Pain Location Knee   Pain Orientation Right   Pain Descriptors / Indicators Sore   Pain Type Surgical pain   Aggravating Factors  Step down and twist   Pain Relieving Factors Laying supine on his back            [facility] PT Assessment - 11/19/16 0001      Assessment   Medical Diagnosis Right total knee replacement.   Onset Date/Surgical Date 10/22/16   Next MD Visit 11/22/2016     Restrictions   Weight Bearing Restrictions No     Observation/Other Assessments-Edema    Edema Circumferential     Circumferential Edema  Circumferential - Right 42 cm   Circumferential - Left  41.1 cm      ROM / Strength   AROM / PROM / Strength AROM     AROM   Overall AROM  Deficits   AROM Assessment Site Knee   Right/Left Knee Right   Right Knee Extension -10   Right Knee Flexion 80                     [facility] Adult PT Treatment/Exercise - 11/19/16 0001      Knee/Hip Exercises: Stretches   Active Hamstring Stretch Right;3 reps;30 seconds   Quad Stretch Right;4 reps;20 seconds     Knee/Hip Exercises: Aerobic   Nustep L5, seat 11 x10 min     Knee/Hip Exercises: Standing   Forward Lunges Right;2 sets;10 reps;5 seconds   Forward Step Up Right;2 sets;10 reps;Hand Hold: 2;Step Height: 6"   Rocker Board 3 minutes     Modalities   Modalities Lexicographer Location R knee   Electrical Stimulation Action IFC   Electrical Stimulation Parameters 1-10 hz x15 min   Electrical Stimulation Goals Edema;Pain     Vasopneumatic   Number Minutes Vasopneumatic  15 minutes   Vasopnuematic Location  Knee   Vasopneumatic Pressure Medium   Vasopneumatic Temperature  50                     PT Long Term Goals - 11/09/16 [id number]      PT LONG TERM GOAL #1   Title Ind with an advanced HEP.   Time 8   Period Weeks   Status On-going     PT LONG TERM GOAL #2   Title Active right  knee flexion to 115 degrees+ so the patient can perform functional tasks and do so with pain not > 2-3/10.   Time 8   Period Weeks   Status On-going     PT LONG TERM GOAL #3   Title Decrease edema to within 2 cms of non-affected side to assist with pain reduction and range of motion gains.   Time 8   Period Weeks   Status Achieved  0.5 cm difference with R > L 11/09/2016     PT LONG TERM GOAL #4   Title Increase right knee strength to a solid 5/5 to provide good stability for accomplishment of functional activities   Time 8   Period Weeks   Status On-going     PT LONG TERM GOAL #5   Title Perform a reciprocating stair gait with one railing with pain not > 2-3/10.   Time 8   Period Weeks   Status On-going               Plan - 11/19/16 0819    Clinical Impression Statement Patient continues to present in clinic with continued R knee tightness and ROM limitations. Patient still greatly limited with R knee lunges and reports medial and lateral R knee pain during lunges. Minimal R hip hike noted with forward step ups today as he is still limited with R knee flexion to ascend normally. Tightness continues to be reported by patient along superior incision/ distal R Quad during stretches. Quad stretch continued today to increase R knee flexion with tightness of R HS palpated. AROM R knee measured as 10-80 deg today in supine. Edema predominately  palpated along medial and lateral sides of R knee  and into R quad region. 0.9 cm difference measured between R knee and L knee today. Normal modalities response noted following removal of the modalities.    Rehab Potential Excellent   PT Frequency 3x / week   PT Duration 8 weeks   PT Treatment/Interventions ADLs/Self Care Home Management;Cryotherapy;Electrical Stimulation;Therapeutic activities;Therapeutic exercise;Neuromuscular re-education;Patient/family education;Passive range of motion;Vasopneumatic Device   PT Next Visit Plan Continue per R TKR protocol with PROM and modalities for pain/edema per MPT POC. MD note required next treatment for 11/22/2016 appointment.   PT Home Exercise Plan HEP- Quad and HS stretch, lunges   Consulted and Agree with Plan of Care Patient      Patient will benefit from skilled therapeutic intervention in order to improve the following deficits and impairments:  Abnormal gait, Decreased range of motion, Decreased strength, Increased edema, Pain  Visit Diagnosis: Acute pain of right knee  Stiffness of right knee, not elsewhere classified  Localized edema  Muscle weakness (generalized)     Problem List Patient Active Problem List   Diagnosis Date Noted  . History of pulmonary embolus (PE) 03/06/2016  . Primary osteoarthritis of right knee 03/05/2016    Wynelle Fanny, PTA 11/19/2016, 8:44 AM  [facility] [place], [place], [address] Phone: [phone]   Fax:  [phone]  Name: Jeremy [name] MRN: [id number] Date of Birth: 1961/02/16

## 2016-11-21 ENCOUNTER — Ambulatory Visit: Payer: PRIVATE HEALTH INSURANCE | Admitting: Physical Therapy

## 2016-11-21 DIAGNOSIS — M6281 Muscle weakness (generalized): Secondary | ICD-10-CM

## 2016-11-21 DIAGNOSIS — M25561 Pain in right knee: Secondary | ICD-10-CM

## 2016-11-21 DIAGNOSIS — R600 Localized edema: Secondary | ICD-10-CM

## 2016-11-21 DIAGNOSIS — M25661 Stiffness of right knee, not elsewhere classified: Secondary | ICD-10-CM

## 2016-11-21 NOTE — Therapy (Signed)
Jeremy [name], [place], [address] Phone: [phone]   Fax:  [phone]  Physical Therapy Treatment  Patient Details  Name: Jeremy [name] MRN: [id number] Date of Birth: [birth year] Referring Provider: Dorna Leitz MD  Encounter Date: 11/21/2016      PT End of Session - 11/21/16 0743    Visit Number 10   Number of Visits 16   Date for PT Re-Evaluation 12/25/16   PT Start Time 0740   PT Stop Time 0832   PT Time Calculation (min) 52 min   Activity Tolerance Patient tolerated treatment well   Behavior During Therapy [facility] for tasks assessed/performed      Past Medical History:  Diagnosis Date  . Arthritis    both knees   . GERD (gastroesophageal reflux disease)   . H/O renal calculi    has been passed spontaneously but also had cystoscopy for one   . Headache    migraines on occasion - used imitrex 5- 6 yrs. ago   . History of kidney stones    cystoscopy x1, also has past spontaneously  . Hypertension   . Irritable bowel syndrome (IBS)   . PE (pulmonary embolism) 1992   post op- 1 week  . Peripheral vascular disease ([place])    post op DVT-  approx. 2009, treated at [facility]   . Seasonal allergies     Past Surgical History:  Procedure Laterality Date  . ANKLE SURGERY Right    repair of fracture & repair of torn tendons.  Marland Kitchen CARDIAC CATHETERIZATION     diagnostic only  . KNEE SURGERY     8 arthroscopies & one repair of tendon- R   . PERIPHERALLY INSERTED CENTRAL CATHETER INSERTION Right 2001   staph infection in R knee, post surgical, treated with antibiotics    . TONSILLECTOMY    . TOTAL KNEE ARTHROPLASTY Left 03/05/2016   Procedure: LEFT TOTAL KNEE ARTHROPLASTY;  Surgeon: Dorna Leitz, MD;  Location: [place];  Service: Orthopedics;  Laterality: Left;  . TOTAL KNEE ARTHROPLASTY Right 10/22/2016  . TOTAL KNEE ARTHROPLASTY Right 10/22/2016   Procedure: TOTAL KNEE ARTHROPLASTY;  Surgeon: Dorna Leitz, MD;   Location: [place];  Service: Orthopedics;  Laterality: Right;  Marland Kitchen VOCAL CORD INJECTION  2002   bilateral medialization, implants on both vocal chords , done at [facility].     There were no vitals filed for this visit.      Subjective Assessment - 11/21/16 0742    Subjective Reports he exercised a lot yesterday. Reports not using pain killers throughout the day or consecutive days anymore.   Limitations Walking   How long can you walk comfortably? Short distance.   Patient Stated Goals Get back to normal.   Currently in Pain? Yes   Pain Score 6    Pain Location Knee   Pain Orientation Right   Pain Descriptors / Indicators Sore   Pain Type Surgical pain   Aggravating Factors  Rolling side to side in bed, stepping and twisting   Pain Relieving Factors Laying supine             [facility] PT Assessment - 11/21/16 0001      Assessment   Medical Diagnosis Right total knee replacement.   Onset Date/Surgical Date 10/22/16   Next MD Visit 11/22/2016     Restrictions   Weight Bearing Restrictions No     ROM / Strength   AROM / PROM / Strength AROM  AROM   Overall AROM  Deficits   AROM Assessment Site Knee   Right/Left Knee Right   Right Knee Extension -12   Right Knee Flexion 83                     [facility] Adult PT Treatment/Exercise - 11/21/16 0001      Knee/Hip Exercises: Stretches   Active Hamstring Stretch Right;3 reps;30 seconds   Quad Stretch Right;3 reps;30 seconds     Knee/Hip Exercises: Aerobic   Nustep L5, seat 11 x10 min     Knee/Hip Exercises: Standing   Forward Lunges Right;2 sets;10 reps;5 seconds   Forward Lunges Limitations 80 deg flexion with lunge on 6"step   Forward Step Up Right;2 sets;10 reps;Hand Hold: 2;Step Height: 6"     Knee/Hip Exercises: Seated   Other Seated Knee/Hip Exercises Seated contract/relax for knee flex 10 sec hold x10 reps     Modalities   Modalities Electrical Stimulation;Vasopneumatic     Psychologist, counselling Location R knee   Electrical Stimulation Action IFC   Electrical Stimulation Parameters 1-10 hz x15 min   Electrical Stimulation Goals Edema;Pain     Vasopneumatic   Number Minutes Vasopneumatic  15 minutes   Vasopnuematic Location  Knee   Vasopneumatic Pressure Medium   Vasopneumatic Temperature  63                     PT Long Term Goals - 11/21/16 [id number]      PT LONG TERM GOAL #1   Title Ind with an advanced HEP.   Time 8   Period Weeks   Status Achieved     PT LONG TERM GOAL #2   Title Active right  knee flexion to 115 degrees+ so the patient can perform functional tasks and do so with pain not > 2-3/10.   Time 8   Period Weeks   Status On-going  83 deg flexion AROM 11/21/2016     PT LONG TERM GOAL #3   Title Decrease edema to within 2 cms of non-affected side to assist with pain reduction and range of motion gains.   Time 8   Period Weeks   Status Achieved  0.5 cm difference with R > L 11/09/2016     PT LONG TERM GOAL #4   Title Increase right knee strength to a solid 5/5 to provide good stability for accomplishment of functional activities   Time 8   Period Weeks   Status On-going     PT LONG TERM GOAL #5   Title Perform a reciprocating stair gait with one railing with pain not > 2-3/10.   Time 8   Period Weeks   Status On-going               Plan - 11/21/16 0818    Clinical Impression Statement Patient presented in clinic today with continued ROM limitations and reports that areas along both sides of R knee incision feel as if they are blocking him from achieving ROM. 80 deg flexion measured for the R knee flexion with forward lunge on 6" step. Minimal R hip hike noted with R forward step up secondary to R knee ROM limitations. Contract/relax in sitting initiated today to increase R knee flexion with patient reporting tightness and discomfort throughout the exercise. AROM R knee measured as 12-83 deg today  in supine. Normal modalities response noted following removal of the modalities. Goals remain on-going at  this time secondary to R knee ROM and strength limitations and stair limitation.    Rehab Potential Excellent   PT Frequency 3x / week   PT Duration 8 weeks   PT Treatment/Interventions ADLs/Self Care Home Management;Cryotherapy;Electrical Stimulation;Therapeutic activities;Therapeutic exercise;Neuromuscular re-education;Patient/family education;Passive range of motion;Vasopneumatic Device   PT Next Visit Plan Continue per R TKR protocol with PROM and modalities for pain/edema per MPT POC. MD note required next treatment for 11/22/2016 appointment.   PT Home Exercise Plan HEP- Quad and HS stretch, lunges   Consulted and Agree with Plan of Care Patient      Patient will benefit from skilled therapeutic intervention in order to improve the following deficits and impairments:  Abnormal gait, Decreased range of motion, Decreased strength, Increased edema, Pain  Visit Diagnosis: Acute pain of right knee  Stiffness of right knee, not elsewhere classified  Localized edema  Muscle weakness (generalized)     Problem List Patient Active Problem List   Diagnosis Date Noted  . History of pulmonary embolus (PE) 03/06/2016  . Primary osteoarthritis of right knee 03/05/2016    Ahmed Prima, PTA 11/21/16 9:18 AM Mali Applegate MPT [facility] [place], [place], [address] Phone: [phone]   Fax:  [phone]  Name: KAMIR SCHUNEMAN MRN: [id number] Date of Birth: 1961/03/28

## 2016-11-23 ENCOUNTER — Encounter: Payer: Self-pay | Admitting: Physical Therapy

## 2016-11-23 ENCOUNTER — Ambulatory Visit: Payer: PRIVATE HEALTH INSURANCE | Admitting: Physical Therapy

## 2016-11-23 DIAGNOSIS — M6281 Muscle weakness (generalized): Secondary | ICD-10-CM

## 2016-11-23 DIAGNOSIS — M25661 Stiffness of right knee, not elsewhere classified: Secondary | ICD-10-CM

## 2016-11-23 DIAGNOSIS — M25561 Pain in right knee: Secondary | ICD-10-CM | POA: Diagnosis not present

## 2016-11-23 DIAGNOSIS — R600 Localized edema: Secondary | ICD-10-CM

## 2016-11-23 NOTE — Therapy (Signed)
[facility] [place], [place], [address] Phone: [phone]   Fax:  [phone]  Physical Therapy Treatment  Patient Details  Name: Jeremy [name] MRN: [id number] Date of Birth: 21-May-1961 Referring Provider: Dorna Leitz MD  Encounter Date: 11/23/2016      PT End of Session - 11/23/16 0745    Visit Number 11   Number of Visits 16   Date for PT Re-Evaluation 12/25/16   PT Start Time 0740   PT Stop Time 0830   PT Time Calculation (min) 50 min   Activity Tolerance Patient tolerated treatment well   Behavior During Therapy [facility] for tasks assessed/performed      Past Medical History:  Diagnosis Date  . Arthritis    both knees   . GERD (gastroesophageal reflux disease)   . H/O renal calculi    has been passed spontaneously but also had cystoscopy for one   . Headache    migraines on occasion - used imitrex 5- 6 yrs. ago   . History of kidney stones    cystoscopy x1, also has past spontaneously  . Hypertension   . Irritable bowel syndrome (IBS)   . PE (pulmonary embolism) 1992   post op- 1 week  . Peripheral vascular disease ([place])    post op DVT-  approx. 2009, treated at [facility]   . Seasonal allergies     Past Surgical History:  Procedure Laterality Date  . ANKLE SURGERY Right    repair of fracture & repair of torn tendons.  Marland Kitchen CARDIAC CATHETERIZATION     diagnostic only  . KNEE SURGERY     8 arthroscopies & one repair of tendon- R   . PERIPHERALLY INSERTED CENTRAL CATHETER INSERTION Right 2001   staph infection in R knee, post surgical, treated with antibiotics    . TONSILLECTOMY    . TOTAL KNEE ARTHROPLASTY Left 03/05/2016   Procedure: LEFT TOTAL KNEE ARTHROPLASTY;  Surgeon: Dorna Leitz, MD;  Location: [place];  Service: Orthopedics;  Laterality: Left;  . TOTAL KNEE ARTHROPLASTY Right 10/22/2016  . TOTAL KNEE ARTHROPLASTY Right 10/22/2016   Procedure: TOTAL KNEE ARTHROPLASTY;  Surgeon: Dorna Leitz, MD;   Location: [place];  Service: Orthopedics;  Laterality: Right;  Marland Kitchen VOCAL CORD INJECTION  2002   bilateral medialization, implants on both vocal chords , done at [facility].     There were no vitals filed for this visit.      Subjective Assessment - 11/23/16 0744    Subjective Reports that MD has allowed him to drive and can return to work 12/10/2016. Reports that he wants RLE to be more like LLE and to really push extension.   Limitations Walking   How long can you walk comfortably? Short distance.   Patient Stated Goals Get back to normal.   Currently in Pain? Yes   Pain Score 4    Pain Location Knee   Pain Orientation Right   Pain Descriptors / Indicators Tightness   Pain Type Surgical pain            [facility] PT Assessment - 11/23/16 0001      Assessment   Medical Diagnosis Right total knee replacement.   Onset Date/Surgical Date 10/22/16   Next MD Visit 12/2016     Restrictions   Weight Bearing Restrictions No                     [facility] Adult PT Treatment/Exercise - 11/23/16 0001  Knee/Hip Exercises: Stretches   Active Hamstring Stretch Right;3 reps;30 seconds     Knee/Hip Exercises: Aerobic   Nustep L6, seat 9 x10 min     Knee/Hip Exercises: Standing   Forward Lunges Right;2 sets;10 reps;5 seconds   Forward Lunges Limitations 83-85 deg flexion measured with lunges   Rocker Board 3 minutes     Knee/Hip Exercises: Supine   Short Arc Quad Sets Strengthening;Right;2 sets;10 reps   Short Arc Quad Sets Limitations 4# 5 sec hold   Straight Leg Raises Strengthening;Right;2 sets;10 reps     Modalities   Modalities Water quality scientist R knee   Printmaker Action IFC   Electrical Stimulation Parameters 1-10 hz x15 min   Electrical Stimulation Goals Edema;Pain     Vasopneumatic   Number Minutes Vasopneumatic  15 minutes   Vasopnuematic Location  Knee    Vasopneumatic Pressure Medium   Vasopneumatic Temperature  52     Manual Therapy   Manual Therapy Passive ROM   Passive ROM PROM of R knee into flexion/extension to increase R knee ROM                     PT Long Term Goals - 11/21/16 [id number]      PT LONG TERM GOAL #1   Title Ind with an advanced HEP.   Time 8   Period Weeks   Status Achieved     PT LONG TERM GOAL #2   Title Active right  knee flexion to 115 degrees+ so the patient can perform functional tasks and do so with pain not > 2-3/10.   Time 8   Period Weeks   Status On-going  83 deg flexion AROM 11/21/2016     PT LONG TERM GOAL #3   Title Decrease edema to within 2 cms of non-affected side to assist with pain reduction and range of motion gains.   Time 8   Period Weeks   Status Achieved  0.5 cm difference with R > L 11/09/2016     PT LONG TERM GOAL #4   Title Increase right knee strength to a solid 5/5 to provide good stability for accomplishment of functional activities   Time 8   Period Weeks   Status On-going     PT LONG TERM GOAL #5   Title Perform a reciprocating stair gait with one railing with pain not > 2-3/10.   Time 8   Period Weeks   Status On-going               Plan - 11/23/16 0844    Clinical Impression Statement Patient presented in clinic with instructions from MD to continue pushing R knee ROM. Patient continues to have tightness throughout R knee and along R superior knee during exercises. Patient eager to push ROM today during treatment. 15 deg extensor lag noted today with R SLR. Normal modalities response noted following removal of the modalities.    Rehab Potential Excellent   PT Frequency 3x / week   PT Duration 8 weeks   PT Treatment/Interventions ADLs/Self Care Home Management;Cryotherapy;Electrical Stimulation;Therapeutic activities;Therapeutic exercise;Neuromuscular re-education;Patient/family education;Passive range of motion;Vasopneumatic Device   PT Next Visit  Plan Continue per R TKR protocol with PROM and modalities for pain/edema per MPT POC. MD note required next treatment for 11/22/2016 appointment.   PT Home Exercise Plan HEP- Quad and HS stretch, lunges   Consulted and Agree with Plan of Care  Patient      Patient will benefit from skilled therapeutic intervention in order to improve the following deficits and impairments:  Abnormal gait, Decreased range of motion, Decreased strength, Increased edema, Pain  Visit Diagnosis: Acute pain of right knee  Stiffness of right knee, not elsewhere classified  Localized edema  Muscle weakness (generalized)     Problem List Patient Active Problem List   Diagnosis Date Noted  . History of pulmonary embolus (PE) 03/06/2016  . Primary osteoarthritis of right knee 03/05/2016    Wynelle Fanny, PTA 11/23/2016, 8:51 AM  [facility] [place], [place], [address] Phone: [phone]   Fax:  [phone]  Name: NAHUN CYPHERT MRN: [id number] Date of Birth: 1961-03-11

## 2016-11-28 ENCOUNTER — Encounter: Payer: Self-pay | Admitting: Physical Therapy

## 2016-11-28 ENCOUNTER — Ambulatory Visit: Payer: PRIVATE HEALTH INSURANCE | Admitting: Physical Therapy

## 2016-11-28 DIAGNOSIS — M25661 Stiffness of right knee, not elsewhere classified: Secondary | ICD-10-CM

## 2016-11-28 DIAGNOSIS — M6281 Muscle weakness (generalized): Secondary | ICD-10-CM

## 2016-11-28 DIAGNOSIS — M25561 Pain in right knee: Secondary | ICD-10-CM | POA: Diagnosis not present

## 2016-11-28 DIAGNOSIS — R600 Localized edema: Secondary | ICD-10-CM

## 2016-11-28 NOTE — Therapy (Signed)
[facility] [place], [place], [address] Phone: [phone]   Fax:  [phone]  Physical Therapy Treatment  Patient Details  Name: Jeremy [name] MRN: [id number] Date of Birth: February 11, 1961 Referring Provider: Dorna Leitz MD  Encounter Date: 11/28/2016      PT End of Session - 11/28/16 0738    Visit Number 12   Number of Visits 16   Date for PT Re-Evaluation 12/25/16   PT Start Time 0733   PT Stop Time 0820   PT Time Calculation (min) 47 min   Activity Tolerance Patient tolerated treatment well   Behavior During Therapy [facility] for tasks assessed/performed      Past Medical History:  Diagnosis Date  . Arthritis    both knees   . GERD (gastroesophageal reflux disease)   . H/O renal calculi    has been passed spontaneously but also had cystoscopy for one   . Headache    migraines on occasion - used imitrex 5- 6 yrs. ago   . History of kidney stones    cystoscopy x1, also has past spontaneously  . Hypertension   . Irritable bowel syndrome (IBS)   . PE (pulmonary embolism) 1992   post op- 1 week  . Peripheral vascular disease ([place])    post op DVT-  approx. 2009, treated at [facility]   . Seasonal allergies     Past Surgical History:  Procedure Laterality Date  . ANKLE SURGERY Right    repair of fracture & repair of torn tendons.  Marland Kitchen CARDIAC CATHETERIZATION     diagnostic only  . KNEE SURGERY     8 arthroscopies & one repair of tendon- R   . PERIPHERALLY INSERTED CENTRAL CATHETER INSERTION Right 2001   staph infection in R knee, post surgical, treated with antibiotics    . TONSILLECTOMY    . TOTAL KNEE ARTHROPLASTY Left 03/05/2016   Procedure: LEFT TOTAL KNEE ARTHROPLASTY;  Surgeon: Dorna Leitz, MD;  Location: [place];  Service: Orthopedics;  Laterality: Left;  . TOTAL KNEE ARTHROPLASTY Right 10/22/2016  . TOTAL KNEE ARTHROPLASTY Right 10/22/2016   Procedure: TOTAL KNEE ARTHROPLASTY;  Surgeon: Dorna Leitz, MD;   Location: [place];  Service: Orthopedics;  Laterality: Right;  Marland Kitchen VOCAL CORD INJECTION  2002   bilateral medialization, implants on both vocal chords , done at [facility].     There were no vitals filed for this visit.      Subjective Assessment - 11/28/16 0737    Subjective Able to extend R knee on NuStep this morning.   Limitations Walking   How long can you walk comfortably? Short distance.   Patient Stated Goals Get back to normal.   Currently in Pain? Yes   Pain Score 4    Pain Location Knee   Pain Orientation Right;Anterior;Upper   Pain Descriptors / Indicators Sore;Tightness   Pain Type Surgical pain            [facility] PT Assessment - 11/28/16 0001      Assessment   Medical Diagnosis Right total knee replacement.   Onset Date/Surgical Date 10/22/16   Next MD Visit 12/2016     Restrictions   Weight Bearing Restrictions No     ROM / Strength   AROM / PROM / Strength AROM     AROM   Overall AROM  Deficits   AROM Assessment Site Knee   Right/Left Knee Right   Right Knee Flexion 95  [place] Adult PT Treatment/Exercise - 11/28/16 0001      Knee/Hip Exercises: Stretches   Active Hamstring Stretch Right;3 reps;30 seconds     Knee/Hip Exercises: Aerobic   Nustep L6, seat 11 x10 min     Knee/Hip Exercises: Standing   Forward Lunges Right;2 sets;10 reps;5 seconds   Forward Lunges Limitations 85 deg flexion measured with lunges     Modalities   Modalities Electrical Stimulation;Vasopneumatic     Acupuncturist Location R knee   Electrical Stimulation Action IFC   Electrical Stimulation Parameters 1-10 hz x15 min   Electrical Stimulation Goals Edema;Pain     Vasopneumatic   Number Minutes Vasopneumatic  15 minutes   Vasopnuematic Location  Knee   Vasopneumatic Pressure Medium   Vasopneumatic Temperature  60     Manual Therapy   Manual Therapy Myofascial release;Passive ROM   Myofascial  Release IASTW to R superior knee to decrease ROM limitation and tightness reported by patient   Passive ROM PROM of R knee into flexion to increase R knee ROM                     PT Long Term Goals - 11/21/16 [id number]      PT LONG TERM GOAL #1   Title Ind with an advanced HEP.   Time 8   Period Weeks   Status Achieved     PT LONG TERM GOAL #2   Title Active right  knee flexion to 115 degrees+ so the patient can perform functional tasks and do so with pain not > 2-3/10.   Time 8   Period Weeks   Status On-going  83 deg flexion AROM 11/21/2016     PT LONG TERM GOAL #3   Title Decrease edema to within 2 cms of non-affected side to assist with pain reduction and range of motion gains.   Time 8   Period Weeks   Status Achieved  0.5 cm difference with R > L 11/09/2016     PT LONG TERM GOAL #4   Title Increase right knee strength to a solid 5/5 to provide good stability for accomplishment of functional activities   Time 8   Period Weeks   Status On-going     PT LONG TERM GOAL #5   Title Perform a reciprocating stair gait with one railing with pain not > 2-3/10.   Time 8   Period Weeks   Status On-going               Plan - 11/28/16 0808    Clinical Impression Statement Patient continues to arrive to treatment with reports of limitation with ROM across R superior knee. Patient experiences limitation with forward lunges across the distal femur region or superior knee incision. 85 deg R knee flexion measured with forward lunges today. A line of tightness noted with palpation from lateral edge of incision that extended laterally as well as along superior incision. IASTW conducted with good petiche response noted along lateral line of tightness with patient educated that soreness may present following treatment. Minimal decreased tightness noted along line of tightness of lateral R knee with IASTW but following IASTW PROM of R knee into flexion completed with AROM flexion  measured as 95 deg in supine. Normal modalities response noted following removal of the modalities. Patient reported R knee feeling better upon end of treatment during standing.   Rehab Potential Excellent   PT Frequency 3x / week  PT Duration 8 weeks   PT Treatment/Interventions ADLs/Self Care Home Management;Cryotherapy;Electrical Stimulation;Therapeutic activities;Therapeutic exercise;Neuromuscular re-education;Patient/family education;Passive range of motion;Vasopneumatic Device   PT Next Visit Plan Continue per R TKR protocol with PROM and modalities for pain/edema per MPT POC.    PT Home Exercise Plan HEP- Quad and HS stretch, lunges   Consulted and Agree with Plan of Care Patient      Patient will benefit from skilled therapeutic intervention in order to improve the following deficits and impairments:  Abnormal gait, Decreased range of motion, Decreased strength, Increased edema, Pain  Visit Diagnosis: Acute pain of right knee  Stiffness of right knee, not elsewhere classified  Localized edema  Muscle weakness (generalized)     Problem List Patient Active Problem List   Diagnosis Date Noted  . History of pulmonary embolus (PE) 03/06/2016  . Primary osteoarthritis of right knee 03/05/2016    Wynelle Fanny,  PTA 11/28/2016, 8:24 AM  [facility] [place], [place], [address] Phone: [phone]   Fax:  [phone]  Name: Jeremy [name] MRN: [id number] Date of Birth: 11/19/1961

## 2016-11-30 ENCOUNTER — Encounter: Payer: Self-pay | Admitting: Physical Therapy

## 2016-11-30 ENCOUNTER — Ambulatory Visit: Payer: PRIVATE HEALTH INSURANCE | Admitting: Physical Therapy

## 2016-11-30 DIAGNOSIS — M25561 Pain in right knee: Secondary | ICD-10-CM

## 2016-11-30 DIAGNOSIS — M6281 Muscle weakness (generalized): Secondary | ICD-10-CM

## 2016-11-30 DIAGNOSIS — M25661 Stiffness of right knee, not elsewhere classified: Secondary | ICD-10-CM

## 2016-11-30 DIAGNOSIS — R600 Localized edema: Secondary | ICD-10-CM

## 2016-11-30 NOTE — Therapy (Signed)
[facility] [place], [place], [address] Phone: [phone]   Fax:  [phone]  Physical Therapy Treatment  Patient Details  Name: Jeremy [name] MRN: [id number] Date of Birth: [birth year] Referring Provider: Dorna Leitz MD  Encounter Date: 11/30/2016      PT End of Session - 11/30/16 0737    Visit Number 13   Number of Visits 16   Date for PT Re-Evaluation 12/25/16   PT Start Time 0742   PT Stop Time 0827   PT Time Calculation (min) 45 min   Activity Tolerance Patient tolerated treatment well   Behavior During Therapy [facility] for tasks assessed/performed      Past Medical History:  Diagnosis Date  . Arthritis    both knees   . GERD (gastroesophageal reflux disease)   . H/O renal calculi    has been passed spontaneously but also had cystoscopy for one   . Headache    migraines on occasion - used imitrex 5- 6 yrs. ago   . History of kidney stones    cystoscopy x1, also has past spontaneously  . Hypertension   . Irritable bowel syndrome (IBS)   . PE (pulmonary embolism) 1992   post op- 1 week  . Peripheral vascular disease ([place])    post op DVT-  approx. 2009, treated at [facility]   . Seasonal allergies     Past Surgical History:  Procedure Laterality Date  . ANKLE SURGERY Right    repair of fracture & repair of torn tendons.  Marland Kitchen CARDIAC CATHETERIZATION     diagnostic only  . KNEE SURGERY     8 arthroscopies & one repair of tendon- R   . PERIPHERALLY INSERTED CENTRAL CATHETER INSERTION Right 2001   staph infection in R knee, post surgical, treated with antibiotics    . TONSILLECTOMY    . TOTAL KNEE ARTHROPLASTY Left 03/05/2016   Procedure: LEFT TOTAL KNEE ARTHROPLASTY;  Surgeon: Dorna Leitz, MD;  Location: [place];  Service: Orthopedics;  Laterality: Left;  . TOTAL KNEE ARTHROPLASTY Right 10/22/2016  . TOTAL KNEE ARTHROPLASTY Right 10/22/2016   Procedure: TOTAL KNEE ARTHROPLASTY;  Surgeon: Dorna Leitz, MD;   Location: [place];  Service: Orthopedics;  Laterality: Right;  Marland Kitchen VOCAL CORD INJECTION  2002   bilateral medialization, implants on both vocal chords , done at [facility].     There were no vitals filed for this visit.      Subjective Assessment - 11/30/16 0737    Subjective Reports that pain has decreased tremendously but still has tightness. Reports walking a lot recently and leg swelled up. Reports that the IASTW really helped with superior knee tightness.   Limitations Walking   How long can you walk comfortably? Short distance.   Patient Stated Goals Get back to normal.   Currently in Pain? Yes   Pain Score 3    Pain Location Knee   Pain Orientation Right   Pain Descriptors / Indicators Tightness   Pain Type Surgical pain                         [facility] Adult PT Treatment/Exercise - 11/30/16 0001      Knee/Hip Exercises: Stretches   Active Hamstring Stretch Right;3 reps;30 seconds     Knee/Hip Exercises: Aerobic   Nustep L5, seat 10 x10 min     Knee/Hip Exercises: Standing   Forward Lunges Right;2 sets;10 reps;5 seconds   Forward Lunges  Limitations 85 deg flexion measured with lunges     Knee/Hip Exercises: Supine   Short Arc Quad Sets Strengthening;Right;2 sets;10 reps   Short Arc Quad Sets Limitations 3#      Modalities   Modalities Financial risk analyst IFC   Electrical Stimulation Parameters 1-10 hz x15 min   Electrical Stimulation Goals Edema;Pain     Vasopneumatic   Number Minutes Vasopneumatic  15 minutes   Vasopnuematic Location  Knee   Vasopneumatic Pressure Medium   Vasopneumatic Temperature  62     Manual Therapy   Manual Therapy Myofascial release   Myofascial Release IASTW to R superior knee to decrease ROM limitation and tightness reported by patient                     PT Long Term Goals - 11/21/16  [id number]      PT LONG TERM GOAL #1   Title Ind with an advanced HEP.   Time 8   Period Weeks   Status Achieved     PT LONG TERM GOAL #2   Title Active right  knee flexion to 115 degrees+ so the patient can perform functional tasks and do so with pain not > 2-3/10.   Time 8   Period Weeks   Status On-going  83 deg flexion AROM 11/21/2016     PT LONG TERM GOAL #3   Title Decrease edema to within 2 cms of non-affected side to assist with pain reduction and range of motion gains.   Time 8   Period Weeks   Status Achieved  0.5 cm difference with R > L 11/09/2016     PT LONG TERM GOAL #4   Title Increase right knee strength to a solid 5/5 to provide good stability for accomplishment of functional activities   Time 8   Period Weeks   Status On-going     PT LONG TERM GOAL #5   Title Perform a reciprocating stair gait with one railing with pain not > 2-3/10.   Time 8   Period Weeks   Status On-going               Plan - 11/30/16 0744    Clinical Impression Statement Patient presented in clinic today with decrease in R knee pain although tightness continues to be reported. Patient able to max forward lunges ROM to 85 deg again today. Upon observation R knee extension improving from initial visits. IASTW continued again today in efforts to further decrease R knee tightness especially in superior most R knee. Petiche presented most today along the superior most R knee incision. Decreased IASTW response noted today compared to previous session and patient educated that he may not experience to extent as previous session. Normal modalities response noted following removal of the modalities.    Rehab Potential Excellent   PT Frequency 3x / week   PT Duration 8 weeks   PT Treatment/Interventions ADLs/Self Care Home Management;Cryotherapy;Electrical Stimulation;Therapeutic activities;Therapeutic exercise;Neuromuscular re-education;Patient/family education;Passive range of  motion;Vasopneumatic Device   PT Next Visit Plan Continue per R TKR protocol with PROM and modalities for pain/edema per MPT POC.    PT Home Exercise Plan HEP- Quad and HS stretch, lunges   Consulted and Agree with Plan of Care Patient      Patient will benefit from skilled therapeutic intervention in order to improve the following deficits and impairments:  Abnormal gait, Decreased range of motion, Decreased strength, Increased edema, Pain  Visit Diagnosis: Acute pain of right knee  Stiffness of right knee, not elsewhere classified  Localized edema  Muscle weakness (generalized)     Problem List Patient Active Problem List   Diagnosis Date Noted  . History of pulmonary embolus (PE) 03/06/2016  . Primary osteoarthritis of right knee 03/05/2016    Wynelle Fanny, PTA 11/30/2016, 8:32 AM  [facility] [place], [place], [address] Phone: [phone]   Fax:  [phone]  Name: JAQUANE PENSINGER MRN: [id number] Date of Birth: [birth year]

## 2016-12-05 ENCOUNTER — Encounter: Payer: Self-pay | Admitting: Physical Therapy

## 2016-12-05 ENCOUNTER — Ambulatory Visit: Payer: 59 | Attending: Orthopedic Surgery | Admitting: Physical Therapy

## 2016-12-05 DIAGNOSIS — R600 Localized edema: Secondary | ICD-10-CM | POA: Insufficient documentation

## 2016-12-05 DIAGNOSIS — M25561 Pain in right knee: Secondary | ICD-10-CM | POA: Insufficient documentation

## 2016-12-05 DIAGNOSIS — M6281 Muscle weakness (generalized): Secondary | ICD-10-CM | POA: Insufficient documentation

## 2016-12-05 DIAGNOSIS — M25661 Stiffness of right knee, not elsewhere classified: Secondary | ICD-10-CM | POA: Insufficient documentation

## 2016-12-05 NOTE — Therapy (Signed)
[facility] [place], [place], [address] Phone: [phone]   Fax:  [phone]  Physical Therapy Treatment  Patient Details  Name: Jeremy [name] MRN: [id number] Date of Birth: 1961-08-04 Referring Provider: Dorna Leitz MD  Encounter Date: 12/05/2016      PT End of Session - 12/05/16 0744    Visit Number 14   Number of Visits 16   Date for PT Re-Evaluation 12/25/16   PT Start Time 0739   PT Stop Time 0826   PT Time Calculation (min) 47 min   Activity Tolerance Patient tolerated treatment well   Behavior During Therapy [facility] for tasks assessed/performed      Past Medical History:  Diagnosis Date  . Arthritis    both knees   . GERD (gastroesophageal reflux disease)   . H/O renal calculi    has been passed spontaneously but also had cystoscopy for one   . Headache    migraines on occasion - used imitrex 5- 6 yrs. ago   . History of kidney stones    cystoscopy x1, also has past spontaneously  . Hypertension   . Irritable bowel syndrome (IBS)   . PE (pulmonary embolism) 1992   post op- 1 week  . Peripheral vascular disease ([place])    post op DVT-  approx. 2009, treated at [facility]   . Seasonal allergies     Past Surgical History:  Procedure Laterality Date  . ANKLE SURGERY Right    repair of fracture & repair of torn tendons.  Marland Kitchen CARDIAC CATHETERIZATION     diagnostic only  . KNEE SURGERY     8 arthroscopies & one repair of tendon- R   . PERIPHERALLY INSERTED CENTRAL CATHETER INSERTION Right 2001   staph infection in R knee, post surgical, treated with antibiotics    . TONSILLECTOMY    . TOTAL KNEE ARTHROPLASTY Left 03/05/2016   Procedure: LEFT TOTAL KNEE ARTHROPLASTY;  Surgeon: Dorna Leitz, MD;  Location: [place];  Service: Orthopedics;  Laterality: Left;  . TOTAL KNEE ARTHROPLASTY Right 10/22/2016  . TOTAL KNEE ARTHROPLASTY Right 10/22/2016   Procedure: TOTAL KNEE ARTHROPLASTY;  Surgeon: Dorna Leitz, MD;   Location: [place];  Service: Orthopedics;  Laterality: Right;  Marland Kitchen VOCAL CORD INJECTION  2002   bilateral medialization, implants on both vocal chords , done at [facility].     There were no vitals filed for this visit.      Subjective Assessment - 12/05/16 0743    Subjective Reports that walking allows his knee to loosen but within 20 minutes of sitting it tightens again. Reports tightness upon arrival today. Reports minimal R knee pain.   Limitations Walking   How long can you walk comfortably? Short distance.   Patient Stated Goals Get back to normal.   Currently in Pain? Yes   Pain Score 7    Pain Location Knee   Pain Orientation Right   Pain Descriptors / Indicators Tightness   Pain Type Surgical pain            [facility] PT Assessment - 12/05/16 0001      Assessment   Medical Diagnosis Right total knee replacement.   Onset Date/Surgical Date 10/22/16   Next MD Visit 12/2016     Restrictions   Weight Bearing Restrictions No     ROM / Strength   AROM / PROM / Strength AROM     AROM   Overall AROM  Deficits   AROM  Assessment Site Knee   Right/Left Knee Right   Right Knee Extension 7   Right Knee Flexion 91                     [facility] Adult PT Treatment/Exercise - 12/05/16 0001      Knee/Hip Exercises: Stretches   Active Hamstring Stretch Right;3 reps;30 seconds     Knee/Hip Exercises: Aerobic   Stationary Bike L1, seat 9-11 x10 min     Knee/Hip Exercises: Standing   Forward Lunges Right;2 sets;10 reps;5 seconds   Forward Lunges Limitations 89-94 deg R knee flexion measured     Knee/Hip Exercises: Seated   Long Arc Quad Strengthening;Right;2 sets;10 reps;Weights   Long Arc Quad Weight 4 lbs.     Modalities   Modalities Financial risk analyst IFC   Electrical Stimulation Parameters 110 hz x15 min   Electrical Stimulation Goals  Edema;Pain     Vasopneumatic   Number Minutes Vasopneumatic  15 minutes   Vasopnuematic Location  Knee   Vasopneumatic Pressure Medium   Vasopneumatic Temperature  34     Manual Therapy   Manual Therapy Myofascial release   Myofascial Release IASTW to R superior knee to decrease ROM limitation and tightness reported by patient  in long sitting                     PT Long Term Goals - 11/21/16 [id number]      PT LONG TERM GOAL #1   Title Ind with an advanced HEP.   Time 8   Period Weeks   Status Achieved     PT LONG TERM GOAL #2   Title Active right  knee flexion to 115 degrees+ so the patient can perform functional tasks and do so with pain not > 2-3/10.   Time 8   Period Weeks   Status On-going  83 deg flexion AROM 11/21/2016     PT LONG TERM GOAL #3   Title Decrease edema to within 2 cms of non-affected side to assist with pain reduction and range of motion gains.   Time 8   Period Weeks   Status Achieved  0.5 cm difference with R > L 11/09/2016     PT LONG TERM GOAL #4   Title Increase right knee strength to a solid 5/5 to provide good stability for accomplishment of functional activities   Time 8   Period Weeks   Status On-going     PT LONG TERM GOAL #5   Title Perform a reciprocating stair gait with one railing with pain not > 2-3/10.   Time 8   Period Weeks   Status On-going               Plan - 12/05/16 [id number]    Clinical Impression Statement Patient arrived to treatment with continued reports of R knee tightness. Patient guided to try stationary bike today with patient reporting tightness and having to move seat back to ease bike revolutions. Patient able to achieve 89-94 deg flexion with forward lunges today. IASTW conducted in long sitting today to provide greater quad stretch with good petiche response noted along B superior incision. AROM R knee measured as 7-91 deg in supine today. Normal modalities response noted following removal of the  modalities.   Rehab Potential Excellent   PT Frequency 3x / week   PT Duration  8 weeks   PT Treatment/Interventions ADLs/Self Care Home Management;Cryotherapy;Electrical Stimulation;Therapeutic activities;Therapeutic exercise;Neuromuscular re-education;Patient/family education;Passive range of motion;Vasopneumatic Device   PT Next Visit Plan Continue per R TKR protocol with PROM and modalities for pain/edema per MPT POC.    PT Home Exercise Plan HEP- Quad and HS stretch, lunges   Consulted and Agree with Plan of Care Patient      Patient will benefit from skilled therapeutic intervention in order to improve the following deficits and impairments:  Abnormal gait, Decreased range of motion, Decreased strength, Increased edema, Pain  Visit Diagnosis: Acute pain of right knee  Stiffness of right knee, not elsewhere classified  Localized edema  Muscle weakness (generalized)     Problem List Patient Active Problem List   Diagnosis Date Noted  . History of pulmonary embolus (PE) 03/06/2016  . Primary osteoarthritis of right knee 03/05/2016    Wynelle Fanny, PTA 12/05/2016, 8:54 AM  [facility] [place], [place], [address] Phone: [phone]   Fax:  [phone]  Name: Jeremy [name] MRN: [id number] Date of Birth: 1961-08-06

## 2016-12-07 ENCOUNTER — Ambulatory Visit: Payer: 59 | Admitting: Physical Therapy

## 2016-12-07 ENCOUNTER — Encounter: Payer: Self-pay | Admitting: Physical Therapy

## 2016-12-07 DIAGNOSIS — R600 Localized edema: Secondary | ICD-10-CM

## 2016-12-07 DIAGNOSIS — M25561 Pain in right knee: Secondary | ICD-10-CM | POA: Diagnosis not present

## 2016-12-07 DIAGNOSIS — M6281 Muscle weakness (generalized): Secondary | ICD-10-CM

## 2016-12-07 DIAGNOSIS — M25661 Stiffness of right knee, not elsewhere classified: Secondary | ICD-10-CM

## 2016-12-07 NOTE — Therapy (Addendum)
[facility] [place], [place], [address] Phone: [phone]   Fax:  [phone]  Physical Therapy Treatment  Patient Details  Name: Jeremy [name] MRN: [id number] Date of Birth: 08/02/1961 Referring Provider: Dorna Leitz MD  Encounter Date: 12/07/2016      PT End of Session - 12/07/16 0738    Visit Number 15   Number of Visits 16   Date for PT Re-Evaluation 12/25/16   PT Start Time 0738   PT Stop Time 0822   PT Time Calculation (min) 44 min   Activity Tolerance Patient tolerated treatment well   Behavior During Therapy [facility] for tasks assessed/performed      Past Medical History:  Diagnosis Date  . Arthritis    both knees   . GERD (gastroesophageal reflux disease)   . H/O renal calculi    has been passed spontaneously but also had cystoscopy for one   . Headache    migraines on occasion - used imitrex 5- 6 yrs. ago   . History of kidney stones    cystoscopy x1, also has past spontaneously  . Hypertension   . Irritable bowel syndrome (IBS)   . PE (pulmonary embolism) 1992   post op- 1 week  . Peripheral vascular disease ([place])    post op DVT-  approx. 2009, treated at [facility]   . Seasonal allergies     Past Surgical History:  Procedure Laterality Date  . ANKLE SURGERY Right    repair of fracture & repair of torn tendons.  Marland Kitchen CARDIAC CATHETERIZATION     diagnostic only  . KNEE SURGERY     8 arthroscopies & one repair of tendon- R   . PERIPHERALLY INSERTED CENTRAL CATHETER INSERTION Right 2001   staph infection in R knee, post surgical, treated with antibiotics    . TONSILLECTOMY    . TOTAL KNEE ARTHROPLASTY Left 03/05/2016   Procedure: LEFT TOTAL KNEE ARTHROPLASTY;  Surgeon: Dorna Leitz, MD;  Location: [place];  Service: Orthopedics;  Laterality: Left;  . TOTAL KNEE ARTHROPLASTY Right 10/22/2016  . TOTAL KNEE ARTHROPLASTY Right 10/22/2016   Procedure: TOTAL KNEE ARTHROPLASTY;  Surgeon: Dorna Leitz, MD;   Location: [place];  Service: Orthopedics;  Laterality: Right;  Marland Kitchen VOCAL CORD INJECTION  2002   bilateral medialization, implants on both vocal chords , done at [facility].     There were no vitals filed for this visit.      Subjective Assessment - 12/07/16 0738    Subjective Reports that this is his last treatment until he finds out what his new insurance will pay. Reports that he also returns to work Monday 12/10/2016. Reports starting celebrex Monday per Dr. Berenice Primas' PA and patient seems to think it is making his knee feel better.   Limitations Walking   How long can you walk comfortably? Short distance.   Patient Stated Goals Get back to normal.   Currently in Pain? No/denies            [facility] PT Assessment - 12/07/16 0001      Assessment   Medical Diagnosis Right total knee replacement.   Onset Date/Surgical Date 10/22/16   Next MD Visit 12/2016     Restrictions   Weight Bearing Restrictions No     ROM / Strength   AROM / PROM / Strength AROM;Strength     AROM   Overall AROM  Deficits   AROM Assessment Site Knee   Right/Left Knee Right  Right Knee Extension -6   Right Knee Flexion 95     Strength   Overall Strength Deficits;Within functional limits for tasks performed   Strength Assessment Site Knee   Right/Left Knee Right   Right Knee Flexion 4+/5   Right Knee Extension 5/5                     [facility] Adult PT Treatment/Exercise - 12/07/16 0001      Ambulation/Gait   Stairs Yes   Stairs Assistance 7: Independent   Stair Management Technique No rails;Alternating pattern;Forwards   Number of Stairs 4  x1 RT   Height of Stairs 6.5     Knee/Hip Exercises: Stretches   Active Hamstring Stretch Right;3 reps;30 seconds     Knee/Hip Exercises: Aerobic   Stationary Bike L1, seat 9 x10 min     Knee/Hip Exercises: Machines for Strengthening   Cybex Knee Extension 20# 3x10 reps   Cybex Knee Flexion 30# 1x10 reps, 50# 2x10 reps     Knee/Hip Exercises:  Standing   Forward Lunges Right;2 sets;10 reps;5 seconds   Forward Lunges Limitations 96 deg R knee flexion measured     Knee/Hip Exercises: Supine   Straight Leg Raises Strengthening;Right;2 sets;10 reps  10 extensor lag present     Modalities   Modalities Electrical Stimulation;Vasopneumatic     Electrical Stimulation   Electrical Stimulation Location R knee   Electrical Stimulation Action IFC   Electrical Stimulation Parameters 1-10 hz x15 min   Electrical Stimulation Goals Edema;Pain     Vasopneumatic   Number Minutes Vasopneumatic  15 minutes   Vasopnuematic Location  Knee   Vasopneumatic Pressure Medium   Vasopneumatic Temperature  34                     PT Long Term Goals - 12/07/16 0809      PT LONG TERM GOAL #1   Title Ind with an advanced HEP.   Time 8   Period Weeks   Status Achieved     PT LONG TERM GOAL #2   Title Active right  knee flexion to 115 degrees+ so the patient can perform functional tasks and do so with pain not > 2-3/10.   Time 8   Period Weeks   Status Not Met  95 def R knee flexion in supine 12/07/2016     PT LONG TERM GOAL #3   Title Decrease edema to within 2 cms of non-affected side to assist with pain reduction and range of motion gains.   Time 8   Period Weeks   Status Achieved  0.5 cm difference with R > L 11/09/2016     PT LONG TERM GOAL #4   Title Increase right knee strength to a solid 5/5 to provide good stability for accomplishment of functional activities   Time 8   Period Weeks   Status Partially Met  R knee flexors 4+/5, R knee extensors 5/5 12/07/2016     PT LONG TERM GOAL #5   Title Perform a reciprocating stair gait with one railing with pain not > 2-3/10.   Time 8   Period Weeks   Status Partially Met  Reciprical stair gait although descending slightly limited secondary to R knee tightness but no pain 12/07/2016               Plan - 12/07/16 0810    Clinical Impression Statement Patient's chart  to be placed on hold secondary  to insurance but arrived with no current R knee pain. Patient able to maintain seat on stationary bike without moving backwards. Patient able to complete machine knee strengthening without complaint and able to tolerate increased weight. 96 deg flexion measured with forward lunges today and 95 deg flexion measured AROM in supine. 10 deg extensor lag noted with R SLR today. Patient able to ambulate stairs reciprically although descending not as smooth as ascending due to R knee tightness per patient but no pain. Normal modalities response noted following removal of the modalities.   Rehab Potential Excellent   PT Frequency 3x / week   PT Duration 8 weeks   PT Treatment/Interventions ADLs/Self Care Home Management;Cryotherapy;Electrical Stimulation;Therapeutic activities;Therapeutic exercise;Neuromuscular re-education;Patient/family education;Passive range of motion;Vasopneumatic Device   PT Next Visit Plan Place patient's chart on hold at this time.   PT Home Exercise Plan HEP- Quad and HS stretch, lunges   Consulted and Agree with Plan of Care Patient      Patient will benefit from skilled therapeutic intervention in order to improve the following deficits and impairments:  Abnormal gait, Decreased range of motion, Decreased strength, Increased edema, Pain  Visit Diagnosis: Acute pain of right knee  Stiffness of right knee, not elsewhere classified  Localized edema  Muscle weakness (generalized)     Problem List Patient Active Problem List   Diagnosis Date Noted  . History of pulmonary embolus (PE) 03/06/2016  . Primary osteoarthritis of right knee 03/05/2016    Ahmed Prima, PTA 12/07/16 8:26 AM  [facility] [place], [place], [address] Phone: [phone]   Fax:  [phone]  Name: Jeremy [name] MRN: [id number] Date of Birth: [birth year]  PHYSICAL THERAPY DISCHARGE SUMMARY  Visits  from Start of Care: 15.  Current functional level related to goals / functional outcomes: See above.  Remaining deficits: See goal section.   Education / Equipment: HEP. Plan: Patient agrees to discharge.  Patient goals were partially met. Patient is being discharged due to being pleased with the current functional level.  ?????         Mali Applegate MPT

## 2021-02-28 ENCOUNTER — Encounter (INDEPENDENT_AMBULATORY_CARE_PROVIDER_SITE_OTHER): Payer: Self-pay | Admitting: *Deleted

## 2022-03-29 ENCOUNTER — Other Ambulatory Visit: Payer: Self-pay | Admitting: Family Medicine

## 2022-03-29 DIAGNOSIS — R109 Unspecified abdominal pain: Secondary | ICD-10-CM

## 2022-03-29 DIAGNOSIS — R300 Dysuria: Secondary | ICD-10-CM

## 2022-04-24 ENCOUNTER — Ambulatory Visit
Admission: RE | Admit: 2022-04-24 | Discharge: 2022-04-24 | Disposition: A | Discharge status: Home / Self Care | Payer: Managed Care, Other (non HMO) | Source: Ambulatory Visit | Attending: Family Medicine | Admitting: Family Medicine

## 2022-04-24 DIAGNOSIS — R109 Unspecified abdominal pain: Secondary | ICD-10-CM

## 2022-04-24 DIAGNOSIS — R300 Dysuria: Secondary | ICD-10-CM

## 2022-04-24 MED ORDER — IOPAMIDOL (ISOVUE-300) INJECTION 61%
100.0000 mL | Freq: Once | INTRAVENOUS | Status: AC | PRN
  Administered 2022-04-24: 100 mL via INTRAVENOUS

## 2022-05-23 DIAGNOSIS — K8010 Calculus of gallbladder with chronic cholecystitis without obstruction: Secondary | ICD-10-CM | POA: Insufficient documentation

## 2022-05-23 HISTORY — DX: Calculus of gallbladder with chronic cholecystitis without obstruction: K80.10

## 2022-06-06 DIAGNOSIS — K828 Other specified diseases of gallbladder: Secondary | ICD-10-CM | POA: Insufficient documentation

## 2022-06-06 HISTORY — DX: Other specified diseases of gallbladder: K82.8

## 2022-07-26 ENCOUNTER — Encounter: Payer: Self-pay | Admitting: *Deleted

## 2022-08-03 HISTORY — PX: CHOLECYSTECTOMY: SHX55

## 2022-08-29 ENCOUNTER — Ambulatory Visit: Payer: Managed Care, Other (non HMO) | Admitting: Internal Medicine

## 2022-08-29 DIAGNOSIS — G4700 Insomnia, unspecified: Secondary | ICD-10-CM | POA: Insufficient documentation

## 2022-08-29 HISTORY — DX: Insomnia, unspecified: G47.00

## 2022-10-03 DIAGNOSIS — F329 Major depressive disorder, single episode, unspecified: Secondary | ICD-10-CM | POA: Insufficient documentation

## 2022-10-03 DIAGNOSIS — F411 Generalized anxiety disorder: Secondary | ICD-10-CM | POA: Insufficient documentation

## 2022-10-03 HISTORY — DX: Major depressive disorder, single episode, unspecified: F32.9

## 2022-10-10 ENCOUNTER — Encounter: Payer: Self-pay | Admitting: Internal Medicine

## 2022-10-10 ENCOUNTER — Ambulatory Visit (INDEPENDENT_AMBULATORY_CARE_PROVIDER_SITE_OTHER): Payer: Managed Care, Other (non HMO) | Admitting: Internal Medicine

## 2022-10-10 ENCOUNTER — Encounter: Payer: Self-pay | Admitting: *Deleted

## 2022-10-10 VITALS — BP 155/94 | HR 98 | Temp 98.4°F | Ht 72.0 in | Wt 206.1 lb

## 2022-10-10 DIAGNOSIS — R109 Unspecified abdominal pain: Secondary | ICD-10-CM | POA: Diagnosis not present

## 2022-10-10 DIAGNOSIS — R197 Diarrhea, unspecified: Secondary | ICD-10-CM

## 2022-10-10 DIAGNOSIS — K219 Gastro-esophageal reflux disease without esophagitis: Secondary | ICD-10-CM | POA: Diagnosis not present

## 2022-10-10 DIAGNOSIS — R634 Abnormal weight loss: Secondary | ICD-10-CM | POA: Diagnosis not present

## 2022-10-10 DIAGNOSIS — I10 Essential (primary) hypertension: Secondary | ICD-10-CM

## 2022-10-10 DIAGNOSIS — Z7901 Long term (current) use of anticoagulants: Secondary | ICD-10-CM

## 2022-10-10 MED ORDER — PEG 3350-KCL-NA BICARB-NACL 420 G PO SOLR: 4000.0000 mL | Freq: Once | ORAL | 0 refills | Status: AC

## 2022-10-10 NOTE — Progress Notes (Signed)
Primary Care Physician:  Caryl Bis, MD Primary Gastroenterologist:  Dr. Abbey Chatters  Chief Complaint  Patient presents with   Diarrhea    Patient here today due to issues with diarrhea, appetite changes and weight loss. Symptoms on going since April 2023.Denies any nausea,vomiting or abdominal pain. Reports to have had a recent cholecystomy in July 2023.    HPI:   Jeremy [name] is a 61 y.o. male who presents to clinic today by referral from his PCP Dr. Quillian Quince for evaluation.  Multiple GI complaints for me today.  He states he has chronic diarrhea for the last month or so.  Notes 5-8 loose bowel movements daily.  Notes brown water.  Cannot remember the last time he had a normal bowel movement.  Does note some abdominal cramping prior to having a BM.  Has tried Imodium some without much relief.  Last colonoscopy 2012 unremarkable besides scarring of the IC valve and internal hemorrhoids.  No family history of colorectal malignancy.  No melena hematochezia.  Does note weight loss of 40 pounds.  States his appetite is good.  Does have chronic reflux which is well controlled on omeprazole 2 times daily.  No dysphagia odynophagia.  No epigastric or chest pain.  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  He is unsure about underlying clotting disorder.  Past Medical History:  Diagnosis Date   Arthritis    both knees    GERD (gastroesophageal reflux disease)    H/O renal calculi    has been passed spontaneously but also had cystoscopy for one    Headache    migraines on occasion - used imitrex 5- 6 yrs. ago    History of kidney stones    cystoscopy x1, also has past spontaneously   Hypertension    Irritable bowel syndrome (IBS)    PE (pulmonary embolism) 1992   post op- 1 week   Peripheral vascular disease ([place])    post op DVT-  approx. 2009, treated at [place] [facility]     Past Surgical History:  Procedure Laterality Date   ANKLE SURGERY  Right    repair of fracture & repair of torn tendons.   CARDIAC CATHETERIZATION     diagnostic only   KNEE SURGERY     8 arthroscopies & one repair of tendon- R    PERIPHERALLY INSERTED CENTRAL CATHETER INSERTION Right 2001   staph infection in R knee, post surgical, treated with antibiotics     TONSILLECTOMY     TOTAL KNEE ARTHROPLASTY Left 03/05/2016   Procedure: LEFT TOTAL KNEE ARTHROPLASTY;  Surgeon: Dorna Leitz, MD;  Location: [place];  Service: Orthopedics;  Laterality: Left;   TOTAL KNEE ARTHROPLASTY Right 10/22/2016   TOTAL KNEE ARTHROPLASTY Right 10/22/2016   Procedure: TOTAL KNEE ARTHROPLASTY;  Surgeon: Dorna Leitz, MD;  Location: [place];  Service: Orthopedics;  Laterality: Right;   VOCAL CORD INJECTION  2002   bilateral medialization, implants on both vocal chords , done at [facility].     Current Outpatient Medications  Medication Sig Dispense Refill   aspirin 81 MG chewable tablet Chew 81 mg by mouth daily.     fexofenadine (ALLEGRA) 180 MG tablet Take 180 mg by mouth daily before breakfast.     hydrochlorothiazide (HYDRODIURIL) 25 MG tablet Take 25 mg by mouth every morning.     imipramine (TOFRANIL) 10 MG tablet Take 20 mg by mouth at bedtime.     metoprolol  succinate (TOPROL-XL) 25 MG 24 hr tablet Take 25 mg by mouth at bedtime.     omeprazole (PRILOSEC) 20 MG capsule Take 20-40 mg by mouth 2 (two) times daily before a meal. 40 mg every morning and 20 mg every evening     tamsulosin (FLOMAX) 0.4 MG CAPS capsule Take 0.4 mg by mouth at bedtime.     warfarin (COUMADIN) 3 MG tablet Take 3 mg by mouth daily at 6 PM.     No current facility-administered medications for this visit.    Allergies as of 10/10/2022 - Review Complete 10/10/2022  Allergen Reaction Noted   Morphine and related Nausea Only 03/05/2016    History reviewed. No pertinent family history.  Social History   Socioeconomic History   Marital status: Married    Spouse name: Not on file   Number of  children: Not on file   Years of education: Not on file   Highest education level: Not on file  Occupational History   Not on file  Tobacco Use   Smoking status: Never   Smokeless tobacco: Never  Vaping Use   Vaping Use: Never used  Substance and Sexual Activity   Alcohol use: No   Drug use: No   Sexual activity: Not on file  Other Topics Concern   Not on file  Social History Narrative   Not on file   Social Determinants of Health   Financial Resource Strain: Not on file  Food Insecurity: Not on file  Transportation Needs: Not on file  Physical Activity: Not on file  Stress: Not on file  Social Connections: Not on file  Intimate Partner Violence: Not on file    Subjective: Review of Systems  Constitutional:  Positive for weight loss. Negative for chills and fever.  HENT:  Negative for congestion and hearing loss.   Eyes:  Negative for blurred vision and double vision.  Respiratory:  Negative for cough and shortness of breath.   Cardiovascular:  Negative for chest pain and palpitations.  Gastrointestinal:  Positive for abdominal pain, diarrhea and heartburn. Negative for blood in stool, constipation, melena and vomiting.  Genitourinary:  Negative for dysuria and urgency.  Musculoskeletal:  Negative for joint pain and myalgias.  Skin:  Negative for itching and rash.  Neurological:  Negative for dizziness and headaches.  Psychiatric/Behavioral:  Negative for depression. The patient is not nervous/anxious.        Objective: BP (!) 155/94 (BP Location: Left Arm, Patient Position: Sitting, Cuff Size: Large)   Pulse 98   Temp 98.4 F (36.9 C) (Temporal)   Ht 6' (1.829 m)   Wt 206 lb 1.6 oz (93.5 kg)   BMI 27.95 kg/m  Physical Exam Constitutional:      Appearance: Normal appearance.  HENT:     Head: Normocephalic and atraumatic.  Eyes:     Extraocular Movements: Extraocular movements intact.     Conjunctiva/sclera: Conjunctivae normal.  Cardiovascular:      Rate and Rhythm: Normal rate and regular rhythm.  Pulmonary:     Effort: Pulmonary effort is normal.     Breath sounds: Normal breath sounds.  Abdominal:     General: Bowel sounds are normal.     Palpations: Abdomen is soft.  Musculoskeletal:        General: Normal range of motion.     Cervical back: Normal range of motion and neck supple.  Skin:    General: Skin is warm.  Neurological:     General: No  focal deficit present.     Mental Status: He is alert and oriented to person, place, and time.  Psychiatric:        Mood and Affect: Mood normal.        Behavior: Behavior normal.      Assessment: *Chronic GERD-well-controlled on omeprazole *Weight loss *Diarrhea *Colon cancer screening *Chronic anticoagulation with Coumadin *Hypertension  Plan: Chronic GERD well-controlled on omeprazole.  We will continue.    Etiology of his diarrhea, weight loss, abdominal cramping unclear.  We will schedule for colonoscopy with random colon biopsies to further evaluate. The risks including infection, bleed, or perforation as well as benefits, limitations, alternatives and imponderables have been reviewed with the patient. Questions have been answered. All parties agreeable.  Patient will need to hold Coumadin x5 days prior to procedure.  Discussed increased risk of clot formation during this time and he understands.  Patient is agreeable.  We will check TSH, celiac panel, CRP at [facility].  Recommend he start taking Imodium 3 times a day before meals.  Patient's blood pressure elevated today in clinic 155/94.  163/92 on recheck.  Recommended he follow-up with his PCP in this regard.  10/10/2022 3:09 PM   Disclaimer: This note was dictated with voice recognition software. Similar sounding words can inadvertently be transcribed and may not be corrected upon review.

## 2022-10-10 NOTE — H&P (View-Only) (Signed)
Primary Care Physician:  Caryl Bis, MD Primary Gastroenterologist:  Dr. Abbey Chatters  Chief Complaint  Patient presents with   Diarrhea    Patient here today due to issues with diarrhea, appetite changes and weight loss. Symptoms on going since April 2023.Denies any nausea,vomiting or abdominal pain. Reports to have had a recent cholecystomy in July 2023.    HPI:   Jeremy [name] is a 61 y.o. male who presents to clinic today by referral from his PCP Dr. Quillian Quince for evaluation.  Multiple GI complaints for me today.  He states he has chronic diarrhea for the last month or so.  Notes 5-8 loose bowel movements daily.  Notes brown water.  Cannot remember the last time he had a normal bowel movement.  Does note some abdominal cramping prior to having a BM.  Has tried Imodium some without much relief.  Last colonoscopy 2012 unremarkable besides scarring of the IC valve and internal hemorrhoids.  No family history of colorectal malignancy.  No melena hematochezia.  Does note weight loss of 40 pounds.  States his appetite is good.  Does have chronic reflux which is well controlled on omeprazole 2 times daily.  No dysphagia odynophagia.  No epigastric or chest pain.  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  He is unsure about underlying clotting disorder.  Past Medical History:  Diagnosis Date   Arthritis    both knees    GERD (gastroesophageal reflux disease)    H/O renal calculi    has been passed spontaneously but also had cystoscopy for one    Headache    migraines on occasion - used imitrex 5- 6 yrs. ago    History of kidney stones    cystoscopy x1, also has past spontaneously   Hypertension    Irritable bowel syndrome (IBS)    PE (pulmonary embolism) 1992   post op- 1 week   Peripheral vascular disease ([place])    post op DVT-  approx. 2009, treated at [facility]     Past Surgical History:  Procedure Laterality Date   ANKLE SURGERY  Right    repair of fracture & repair of torn tendons.   CARDIAC CATHETERIZATION     diagnostic only   KNEE SURGERY     8 arthroscopies & one repair of tendon- R    PERIPHERALLY INSERTED CENTRAL CATHETER INSERTION Right 2001   staph infection in R knee, post surgical, treated with antibiotics     TONSILLECTOMY     TOTAL KNEE ARTHROPLASTY Left 03/05/2016   Procedure: LEFT TOTAL KNEE ARTHROPLASTY;  Surgeon: Dorna Leitz, MD;  Location: [place];  Service: Orthopedics;  Laterality: Left;   TOTAL KNEE ARTHROPLASTY Right 10/22/2016   TOTAL KNEE ARTHROPLASTY Right 10/22/2016   Procedure: TOTAL KNEE ARTHROPLASTY;  Surgeon: Dorna Leitz, MD;  Location: [place];  Service: Orthopedics;  Laterality: Right;   VOCAL CORD INJECTION  2002   bilateral medialization, implants on both vocal chords , done at [facility].     Current Outpatient Medications  Medication Sig Dispense Refill   aspirin 81 MG chewable tablet Chew 81 mg by mouth daily.     fexofenadine (ALLEGRA) 180 MG tablet Take 180 mg by mouth daily before breakfast.     hydrochlorothiazide (HYDRODIURIL) 25 MG tablet Take 25 mg by mouth every morning.     imipramine (TOFRANIL) 10 MG tablet Take 20 mg by mouth at bedtime.     metoprolol  succinate (TOPROL-XL) 25 MG 24 hr tablet Take 25 mg by mouth at bedtime.     omeprazole (PRILOSEC) 20 MG capsule Take 20-40 mg by mouth 2 (two) times daily before a meal. 40 mg every morning and 20 mg every evening     tamsulosin (FLOMAX) 0.4 MG CAPS capsule Take 0.4 mg by mouth at bedtime.     warfarin (COUMADIN) 3 MG tablet Take 3 mg by mouth daily at 6 PM.     No current facility-administered medications for this visit.    Allergies as of 10/10/2022 - Review Complete 10/10/2022  Allergen Reaction Noted   Morphine and related Nausea Only 03/05/2016    History reviewed. No pertinent family history.  Social History   Socioeconomic History   Marital status: Married    Spouse name: Not on file   Number of  children: Not on file   Years of education: Not on file   Highest education level: Not on file  Occupational History   Not on file  Tobacco Use   Smoking status: Never   Smokeless tobacco: Never  Vaping Use   Vaping Use: Never used  Substance and Sexual Activity   Alcohol use: No   Drug use: No   Sexual activity: Not on file  Other Topics Concern   Not on file  Social History Narrative   Not on file   Social Determinants of Health   Financial Resource Strain: Not on file  Food Insecurity: Not on file  Transportation Needs: Not on file  Physical Activity: Not on file  Stress: Not on file  Social Connections: Not on file  Intimate Partner Violence: Not on file    Subjective: Review of Systems  Constitutional:  Positive for weight loss. Negative for chills and fever.  HENT:  Negative for congestion and hearing loss.   Eyes:  Negative for blurred vision and double vision.  Respiratory:  Negative for cough and shortness of breath.   Cardiovascular:  Negative for chest pain and palpitations.  Gastrointestinal:  Positive for abdominal pain, diarrhea and heartburn. Negative for blood in stool, constipation, melena and vomiting.  Genitourinary:  Negative for dysuria and urgency.  Musculoskeletal:  Negative for joint pain and myalgias.  Skin:  Negative for itching and rash.  Neurological:  Negative for dizziness and headaches.  Psychiatric/Behavioral:  Negative for depression. The patient is not nervous/anxious.        Objective: BP (!) 155/94 (BP Location: Left Arm, Patient Position: Sitting, Cuff Size: Large)   Pulse 98   Temp 98.4 F (36.9 C) (Temporal)   Ht 6' (1.829 m)   Wt 206 lb 1.6 oz (93.5 kg)   BMI 27.95 kg/m  Physical Exam Constitutional:      Appearance: Normal appearance.  HENT:     Head: Normocephalic and atraumatic.  Eyes:     Extraocular Movements: Extraocular movements intact.     Conjunctiva/sclera: Conjunctivae normal.  Cardiovascular:      Rate and Rhythm: Normal rate and regular rhythm.  Pulmonary:     Effort: Pulmonary effort is normal.     Breath sounds: Normal breath sounds.  Abdominal:     General: Bowel sounds are normal.     Palpations: Abdomen is soft.  Musculoskeletal:        General: Normal range of motion.     Cervical back: Normal range of motion and neck supple.  Skin:    General: Skin is warm.  Neurological:     General: No  focal deficit present.     Mental Status: He is alert and oriented to person, place, and time.  Psychiatric:        Mood and Affect: Mood normal.        Behavior: Behavior normal.      Assessment: *Chronic GERD-well-controlled on omeprazole *Weight loss *Diarrhea *Colon cancer screening *Chronic anticoagulation with Coumadin *Hypertension  Plan: Chronic GERD well-controlled on omeprazole.  We will continue.    Etiology of his diarrhea, weight loss, abdominal cramping unclear.  We will schedule for colonoscopy with random colon biopsies to further evaluate. The risks including infection, bleed, or perforation as well as benefits, limitations, alternatives and imponderables have been reviewed with the patient. Questions have been answered. All parties agreeable.  Patient will need to hold Coumadin x5 days prior to procedure.  Discussed increased risk of clot formation during this time and he understands.  Patient is agreeable.  We will check TSH, celiac panel, CRP at [facility].  Recommend he start taking Imodium 3 times a day before meals.  Patient's blood pressure elevated today in clinic 155/94.  163/92 on recheck.  Recommended he follow-up with his PCP in this regard.  10/10/2022 3:09 PM   Disclaimer: This note was dictated with voice recognition software. Similar sounding words can inadvertently be transcribed and may not be corrected upon review.

## 2022-10-10 NOTE — Patient Instructions (Signed)
I am going to check blood work at [facility] including his thyroid, celiac screening, CRP which is an inflammatory marker.  We will also schedule you for colonoscopy to further evaluate your diarrhea, weight loss abdominal cramping, colon cancer screening purposes.  You will need to hold your Coumadin x5 days prior to procedure.  I want you to start taking Imodium 15 to 20 minutes before each meal.  If this causes constipation, then you can back down to half a tablet before meals.  Further recommendations to follow.  It was very nice meeting both you today.  Dr. Abbey Chatters

## 2022-10-11 ENCOUNTER — Encounter (INDEPENDENT_AMBULATORY_CARE_PROVIDER_SITE_OTHER): Payer: Self-pay | Admitting: *Deleted

## 2022-10-12 LAB — CELIAC AB TTG DGP TIGA
Antigliadin Abs, IgA: 4 units (ref 0–19)
Gliadin IgG: 2 units (ref 0–19)
IgA/Immunoglobulin A, Serum: 326 mg/dL (ref 61–437)
Tissue Transglut Ab: <2 U/mL (ref 0–5)
Transglutaminase IgA: <2 U/mL (ref 0–3)

## 2022-10-12 LAB — C-REACTIVE PROTEIN: CRP: 25 mg/L — ABNORMAL HIGH (ref 0–10)

## 2022-10-12 LAB — TSH: TSH: 1.39 u[IU]/mL (ref 0.450–4.500)

## 2022-10-29 NOTE — Patient Instructions (Signed)
Jeremy [name]  10/29/2022     '@PREFPERIOPPHARMACY'$ @   Your procedure is scheduled on  11/02/2022.   Report to [facility] at  1100 A.M.   Call this number if you have problems the morning of surgery:  [phone]  If you experience any cold or flu symptoms such as cough, fever, chills, shortness of breath, etc. between now and your scheduled surgery, please notify us at the above number.   Remember:  Follow the diet and prep instructions given to you by the office.     Your last dose of coumadin should be on 10/27/2022.     Take these medicines the morning of surgery with A SIP OF WATER                    allegra, metoprolol, omeprazole.     Do not wear jewelry, make-up or nail polish.  Do not wear lotions, powders, or perfumes, or deodorant.  Do not shave 48 hours prior to surgery.  Men may shave face and neck.  Do not bring valuables to the hospital.  [facility] is not responsible for any belongings or valuables.  Contacts, dentures or bridgework may not be worn into surgery.  Leave your suitcase in the car.  After surgery it may be brought to your room.  For patients admitted to the hospital, discharge time will be determined by your treatment team.  Patients discharged the day of surgery will not be allowed to drive home and must have someone with them for 24 hours.    Special instructions:   DO NOT smoke tobacco or vape for 24 hours before your procedure.  Please read over the following fact sheets that you were given. Anesthesia Post-op Instructions and Care and Recovery After Surgery      Colonoscopy, Adult, Care After The following information offers guidance on how to care for yourself after your procedure. Your health care provider may also give you more specific instructions. If you have problems or questions, contact your health care provider. What can I expect after the procedure? After the procedure, it is common to have: A small amount of  blood in your stool for 24 hours after the procedure. Some gas. Mild cramping or bloating of your abdomen. Follow these instructions at home: Eating and drinking  Drink enough fluid to keep your urine pale yellow. Follow instructions from your health care provider about eating or drinking restrictions. Resume your normal diet as told by your health care provider. Avoid heavy or fried foods that are hard to digest. Activity Rest as told by your health care provider. Avoid sitting for a long time without moving. Get up to take short walks every 1-2 hours. This is important to improve blood flow and breathing. Ask for help if you feel weak or unsteady. Return to your normal activities as told by your health care provider. Ask your health care provider what activities are safe for you. Managing cramping and bloating  Try walking around when you have cramps or feel bloated. If directed, apply heat to your abdomen as told by your health care provider. Use the heat source that your health care provider recommends, such as a moist heat pack or a heating pad. Place a towel between your skin and the heat source. Leave the heat on for 20-30 minutes. Remove the heat if your skin turns bright red. This is especially important if you are unable to feel pain,  heat, or cold. You have a greater risk of getting burned. General instructions If you were given a sedative during the procedure, it can affect you for several hours. Do not drive or operate machinery until your health care provider says that it is safe. For the first 24 hours after the procedure: Do not sign important documents. Do not drink alcohol. Do your regular daily activities at a slower pace than normal. Eat soft foods that are easy to digest. Take over-the-counter and prescription medicines only as told by your health care provider. Keep all follow-up visits. This is important. Contact a health care provider if: You have blood in your  stool 2-3 days after the procedure. Get help right away if: You have more than a small spotting of blood in your stool. You have large blood clots in your stool. You have swelling of your abdomen. You have nausea or vomiting. You have a fever. You have increasing pain in your abdomen that is not relieved with medicine. These symptoms may be an emergency. Get help right away. Call 911. Do not wait to see if the symptoms will go away. Do not drive yourself to the hospital. Summary After the procedure, it is common to have a small amount of blood in your stool. You may also have mild cramping and bloating of your abdomen. If you were given a sedative during the procedure, it can affect you for several hours. Do not drive or operate machinery until your health care provider says that it is safe. Get help right away if you have a lot of blood in your stool, nausea or vomiting, a fever, or increased pain in your abdomen. This information is not intended to replace advice given to you by your health care provider. Make sure you discuss any questions you have with your health care provider. Document Revised: 07/12/2021 Document Reviewed: 07/12/2021 Elsevier Patient Education  [place] After The following information offers guidance on how to care for yourself after your procedure. Your health care provider may also give you more specific instructions. If you have problems or questions, contact your health care provider. What can I expect after the procedure? After the procedure, it is common to have: Tiredness. Little or no memory about what happened during or after the procedure. Impaired judgment when it comes to making decisions. Nausea or vomiting. Some trouble with balance. Follow these instructions at home: For the time period you were told by your health care provider:  Rest. Do not participate in activities where you could fall or become  injured. Do not drive or use machinery. Do not drink alcohol. Do not take sleeping pills or medicines that cause drowsiness. Do not make important decisions or sign legal documents. Do not take care of children on your own. Medicines Take over-the-counter and prescription medicines only as told by your health care provider. If you were prescribed antibiotics, take them as told by your health care provider. Do not stop using the antibiotic even if you start to feel better. Eating and drinking Follow instructions from your health care provider about what you may eat and drink. Drink enough fluid to keep your urine pale yellow. If you vomit: Drink clear fluids slowly and in small amounts as you are able. Clear fluids include water, ice chips, low-calorie sports drinks, and fruit juice that has water added to it (diluted fruit juice). Eat Faraj and bland foods in small amounts as you are able. These foods  include bananas, applesauce, rice, lean meats, toast, and crackers. General instructions  Have a responsible adult stay with you for the time you are told. It is important to have someone help care for you until you are awake and alert. If you have sleep apnea, surgery and some medicines can increase your risk for breathing problems. Follow instructions from your health care provider about wearing your sleep device: When you are sleeping. This includes during daytime naps. While taking prescription pain medicines, sleeping medicines, or medicines that make you drowsy. Do not use any products that contain nicotine or tobacco. These products include cigarettes, chewing tobacco, and vaping devices, such as e-cigarettes. If you need help quitting, ask your health care provider. Contact a health care provider if: You feel nauseous or vomit every time you eat or drink. You feel [place]-headed. You are still sleepy or having trouble with balance after 24 hours. You get a rash. You have a fever. You  have redness or swelling around the IV site. Get help right away if: You have trouble breathing. You have new confusion after you get home. These symptoms may be an emergency. Get help right away. Call 911. Do not wait to see if the symptoms will go away. Do not drive yourself to the hospital. This information is not intended to replace advice given to you by your health care provider. Make sure you discuss any questions you have with your health care provider. Document Revised: 04/16/2022 Document Reviewed: 04/16/2022 Elsevier Patient Education  [place].

## 2022-10-30 ENCOUNTER — Encounter (HOSPITAL_COMMUNITY)
Admission: RE | Admit: 2022-10-30 | Discharge: 2022-10-30 | Disposition: A | Discharge status: Home / Self Care | Payer: Managed Care, Other (non HMO) | Source: Ambulatory Visit | Attending: Internal Medicine | Admitting: Internal Medicine

## 2022-10-30 ENCOUNTER — Encounter (HOSPITAL_COMMUNITY): Payer: Self-pay

## 2022-10-30 VITALS — BP 166/94 | HR 88 | Temp 97.9°F | Resp 18 | Ht 72.0 in | Wt 200.0 lb

## 2022-10-30 DIAGNOSIS — Z01818 Encounter for other preprocedural examination: Secondary | ICD-10-CM | POA: Insufficient documentation

## 2022-10-30 DIAGNOSIS — Z79899 Other long term (current) drug therapy: Secondary | ICD-10-CM | POA: Diagnosis not present

## 2022-10-30 DIAGNOSIS — I10 Essential (primary) hypertension: Secondary | ICD-10-CM | POA: Diagnosis not present

## 2022-10-30 DIAGNOSIS — Z01812 Encounter for preprocedural laboratory examination: Secondary | ICD-10-CM | POA: Diagnosis present

## 2022-10-30 HISTORY — DX: Other specified postprocedural states: Z98.890

## 2022-10-30 HISTORY — DX: Other specified postprocedural states: R11.2

## 2022-10-30 LAB — BASIC METABOLIC PANEL
Anion gap: 5 (ref 5–15)
BUN: 14 mg/dL (ref 8–23)
CO2: 27 mmol/L (ref 22–32)
Calcium: 9.1 mg/dL (ref 8.9–10.3)
Chloride: 107 mmol/L (ref 98–111)
Creatinine, Ser: 0.76 mg/dL (ref 0.61–1.24)
GFR, Estimated: >60 mL/min (ref >60)
Glucose, Bld: 115 mg/dL — ABNORMAL HIGH (ref 70–99)
Potassium: 3.8 mmol/L (ref 3.5–5.1)
Sodium: 139 mmol/L (ref 135–145)

## 2022-11-02 ENCOUNTER — Ambulatory Visit (HOSPITAL_BASED_OUTPATIENT_CLINIC_OR_DEPARTMENT_OTHER): Payer: Managed Care, Other (non HMO) | Admitting: Anesthesiology

## 2022-11-02 ENCOUNTER — Ambulatory Visit (HOSPITAL_COMMUNITY): Payer: Managed Care, Other (non HMO) | Admitting: Anesthesiology

## 2022-11-02 ENCOUNTER — Ambulatory Visit (HOSPITAL_COMMUNITY)
Admission: RE | Admit: 2022-11-02 | Discharge: 2022-11-02 | Disposition: A | Discharge status: Home / Self Care | Payer: Managed Care, Other (non HMO) | Attending: Internal Medicine | Admitting: Internal Medicine

## 2022-11-02 ENCOUNTER — Encounter (HOSPITAL_COMMUNITY)
Admission: RE | Disposition: A | Discharge status: Home / Self Care | Payer: Self-pay | Source: Home / Self Care | Attending: Internal Medicine

## 2022-11-02 DIAGNOSIS — R634 Abnormal weight loss: Secondary | ICD-10-CM | POA: Insufficient documentation

## 2022-11-02 DIAGNOSIS — Z86711 Personal history of pulmonary embolism: Secondary | ICD-10-CM | POA: Diagnosis not present

## 2022-11-02 DIAGNOSIS — Z7901 Long term (current) use of anticoagulants: Secondary | ICD-10-CM | POA: Diagnosis not present

## 2022-11-02 DIAGNOSIS — K648 Other hemorrhoids: Secondary | ICD-10-CM | POA: Diagnosis not present

## 2022-11-02 DIAGNOSIS — Z6827 Body mass index (BMI) 27.0-27.9, adult: Secondary | ICD-10-CM | POA: Diagnosis not present

## 2022-11-02 DIAGNOSIS — R197 Diarrhea, unspecified: Secondary | ICD-10-CM | POA: Diagnosis not present

## 2022-11-02 DIAGNOSIS — K635 Polyp of colon: Secondary | ICD-10-CM

## 2022-11-02 DIAGNOSIS — M1990 Unspecified osteoarthritis, unspecified site: Secondary | ICD-10-CM | POA: Diagnosis not present

## 2022-11-02 DIAGNOSIS — K529 Noninfective gastroenteritis and colitis, unspecified: Secondary | ICD-10-CM

## 2022-11-02 DIAGNOSIS — Z86718 Personal history of other venous thrombosis and embolism: Secondary | ICD-10-CM | POA: Diagnosis not present

## 2022-11-02 DIAGNOSIS — I10 Essential (primary) hypertension: Secondary | ICD-10-CM | POA: Diagnosis not present

## 2022-11-02 DIAGNOSIS — R109 Unspecified abdominal pain: Secondary | ICD-10-CM

## 2022-11-02 DIAGNOSIS — D125 Benign neoplasm of sigmoid colon: Secondary | ICD-10-CM

## 2022-11-02 DIAGNOSIS — K219 Gastro-esophageal reflux disease without esophagitis: Secondary | ICD-10-CM | POA: Diagnosis not present

## 2022-11-02 DIAGNOSIS — R519 Headache, unspecified: Secondary | ICD-10-CM | POA: Diagnosis not present

## 2022-11-02 DIAGNOSIS — I739 Peripheral vascular disease, unspecified: Secondary | ICD-10-CM | POA: Insufficient documentation

## 2022-11-02 HISTORY — PX: COLONOSCOPY WITH PROPOFOL: SHX5780

## 2022-11-02 HISTORY — PX: POLYPECTOMY: SHX5525

## 2022-11-02 HISTORY — PX: BIOPSY: SHX5522

## 2022-11-02 SURGERY — COLONOSCOPY WITH PROPOFOL
Anesthesia: General

## 2022-11-02 MED ORDER — LACTATED RINGERS IV SOLN: INTRAVENOUS | Status: DC

## 2022-11-02 MED ORDER — PROPOFOL 500 MG/50ML IV EMUL
INTRAVENOUS | Status: DC | PRN
  Administered 2022-11-02: 150 ug/kg/min via INTRAVENOUS

## 2022-11-02 MED ORDER — PROPOFOL 10 MG/ML IV BOLUS
INTRAVENOUS | Status: DC | PRN
  Administered 2022-11-02: 30 mg via INTRAVENOUS
  Administered 2022-11-02: 50 mg via INTRAVENOUS
  Administered 2022-11-02: 20 mg via INTRAVENOUS

## 2022-11-02 NOTE — Interval H&P Note (Signed)
History and Physical Interval Note:  11/02/2022 11:06 AM  Jeremy [name]  has presented today for surgery, with the diagnosis of diarrhea, weight loss, abdominal cramping.  The various methods of treatment have been discussed with the patient and family. After consideration of risks, benefits and other options for treatment, the patient has consented to  Procedure(s) with comments: COLONOSCOPY WITH PROPOFOL (N/A) - 1:15pm, asa 3 as a surgical intervention.  The patient's history has been reviewed, patient examined, no change in status, stable for surgery.  I have reviewed the patient's chart and labs.  Questions were answered to the patient's satisfaction.     Eloise Harman

## 2022-11-02 NOTE — Op Note (Signed)
[facility] Patient Name: Jeremy [name] Procedure Date: 11/02/2022 11:22 AM MRN: [id number] Date of Birth: [birth year] Attending MD: Elon Alas. Abbey Chatters , [place], [id number] CSN: [id number] Age: 61 Admit Type: Outpatient Procedure:                Colonoscopy Indications:              Chronic diarrhea Providers:                Elon Alas. Abbey Chatters, DO, Janeece Riggers, RN, Aram Candela Referring MD:              Medicines:                See the Anesthesia note for documentation of the                            administered medications Complications:            No immediate complications. Estimated Blood Loss:     Estimated blood loss was minimal. Procedure:                Pre-Anesthesia Assessment:                           - The anesthesia plan was to use monitored                            anesthesia care (MAC).                           After obtaining informed consent, the colonoscope                            was passed under direct vision. Throughout the                            procedure, the patient's blood pressure, pulse, and                            oxygen saturations were monitored continuously. The                            [id number]-HQ190L ([id number]) scope was introduced through                            the anus and advanced to the the cecum, identified                            by appendiceal orifice and ileocecal valve. The                            colonoscopy was performed without difficulty. The                            patient tolerated the procedure well. The quality                            of the  bowel preparation was evaluated using the                            BBPS [facility] Bowel Preparation Scale) with scores                            of: Right Colon = 3, Transverse Colon = 3 and Left                            Colon = 3 (entire mucosa seen well with no residual                            staining, small fragments of stool or opaque                             liquid). The total BBPS score equals 9. Scope In: 11:36:30 AM Scope Out: 11:51:49 AM Scope Withdrawal Time: 0 hours 13 minutes 9 seconds  Total Procedure Duration: 0 hours 15 minutes 19 seconds  Findings:      The perianal and digital rectal examinations were normal.      Two sessile polyps were found in the sigmoid colon. The polyps were 4 to       5 mm in size. These polyps were removed with a cold snare. Resection and       retrieval were complete.      Biopsies for histology were taken with a cold forceps from the ascending       colon, transverse colon and descending colon for evaluation of       microscopic colitis.      The exam was otherwise without abnormality. Impression:               - Two 4 to 5 mm polyps in the sigmoid colon,                            removed with a cold snare. Resected and retrieved.                           - The examination was otherwise normal.                           - Biopsies were taken with a cold forceps from the                            ascending colon, transverse colon and descending                            colon for evaluation of microscopic colitis. Moderate Sedation:      Per Anesthesia Care Recommendation:           - Patient has a contact number available for                            emergencies. The signs and symptoms of potential  delayed complications were discussed with the                            patient. Return to normal activities tomorrow.                            Written discharge instructions were provided to the                            patient.                           - Resume previous diet.                           - Continue present medications.                           - Await pathology results.                           - Repeat colonoscopy in 5-10 years for surveillance.                           - Return to GI clinic in 3 months. Procedure Code(s):        ---  Professional ---                           [phone], Colonoscopy, flexible; with removal of                            tumor(s), polyp(s), or other lesion(s) by snare                            technique                           45380, 34, Colonoscopy, flexible; with biopsy,                            single or multiple Diagnosis Code(s):        --- Professional ---                           D12.5, Benign neoplasm of sigmoid colon                           K52.9, Noninfective gastroenteritis and colitis,                            unspecified CPT copyright 2022 American Medical Association. All rights reserved. The codes documented in this report are preliminary and upon coder review may  be revised to meet current compliance requirements. Elon Alas. Abbey Chatters, DO [place] [facility] Abbey Chatters, DO 11/02/2022 11:53:39 AM This report has been signed electronically. Number of Addenda: 0

## 2022-11-02 NOTE — Discharge Instructions (Addendum)
  Colonoscopy Discharge Instructions  Read the instructions outlined below and refer to this sheet in the next few weeks. These discharge instructions provide you with general information on caring for yourself after you leave the hospital. Your doctor may also give you specific instructions. While your treatment has been planned according to the most current medical practices available, unavoidable complications occasionally occur.   ACTIVITY You may resume your regular activity, but move at a slower pace for the next 24 hours.  Take frequent rest periods for the next 24 hours.  Walking will help get rid of the air and reduce the bloated feeling in your belly (abdomen).  No driving for 24 hours (because of the medicine (anesthesia) used during the test).   Do not sign any important legal documents or operate any machinery for 24 hours (because of the anesthesia used during the test).  NUTRITION Drink plenty of fluids.  You may resume your normal diet as instructed by your doctor.  Begin with a [place] meal and progress to your normal diet. Heavy or fried foods are harder to digest and may make you feel sick to your stomach (nauseated).  Avoid alcoholic beverages for 24 hours or as instructed.  MEDICATIONS You may resume your normal medications unless your doctor tells you otherwise.  WHAT YOU CAN EXPECT TODAY Some feelings of bloating in the abdomen.  Passage of more gas than usual.  Spotting of blood in your stool or on the toilet paper.  IF YOU HAD POLYPS REMOVED DURING THE COLONOSCOPY: No aspirin products for 7 days or as instructed.  No alcohol for 7 days or as instructed.  Eat a soft diet for the next 24 hours.  FINDING OUT THE RESULTS OF YOUR TEST Not all test results are available during your visit. If your test results are not back during the visit, make an appointment with your caregiver to find out the results. Do not assume everything is normal if you have not heard from your  caregiver or the medical facility. It is important for you to follow up on all of your test results.  SEEK IMMEDIATE MEDICAL ATTENTION IF: You have more than a spotting of blood in your stool.  Your belly is swollen (abdominal distention).  You are nauseated or vomiting.  You have a temperature over 101.  You have abdominal pain or discomfort that is severe or gets worse throughout the day.   Your colonoscopy revealed 2 polyp(s) which I removed successfully. Await pathology results, my office will contact you. I recommend repeating colonoscopy in 5-10 years for surveillance purposes depending on pathology results.   Your colon looked very healthy.  I did not see any active inflammation indicative of underlying inflammatory bowel disease such as Crohn's disease or ulcerative colitis.  I did take biopsies your colon to rule out a condition called microscopic colitis which can cause chronically loose stools.  Continue Imodium 3 times daily.  Follow-up with GI in 3 months.  I hope you have a great rest of your week!  Elon Alas. Abbey Chatters, D.O. Gastroenterology and Hepatology [facility]

## 2022-11-02 NOTE — Transfer of Care (Signed)
Immediate Anesthesia Transfer of Care Note  Patient: Jeremy [name]  Procedure(s) Performed: COLONOSCOPY WITH PROPOFOL BIOPSY POLYPECTOMY  Patient Location: PACU  Anesthesia Type:MAC  Level of Consciousness: drowsy  Airway & Oxygen Therapy: Patient Spontanous Breathing and Patient connected to nasal cannula oxygen  Post-op Assessment: Report given to RN and Post -op Vital signs reviewed and stable  Post vital signs: Reviewed and stable  Last Vitals:  Vitals Value Taken Time  BP    Temp    Pulse    Resp    SpO2      Last Pain:  Vitals:   11/02/22 1116  TempSrc: Oral  PainSc: 0-No pain      Patients Stated Pain Goal: 8 (14/43/60 1658)  Complications: No notable events documented.

## 2022-11-02 NOTE — Anesthesia Postprocedure Evaluation (Signed)
Anesthesia Post Note  Patient: Jeremy [name]  Procedure(s) Performed: COLONOSCOPY WITH PROPOFOL BIOPSY POLYPECTOMY  Patient location during evaluation: Phase II Anesthesia Type: General Level of consciousness: awake and alert and oriented Pain management: pain level controlled Vital Signs Assessment: post-procedure vital signs reviewed and stable Respiratory status: spontaneous breathing, nonlabored ventilation and respiratory function stable Cardiovascular status: blood pressure returned to baseline and stable Postop Assessment: no apparent nausea or vomiting Anesthetic complications: no  No notable events documented.   Last Vitals:  Vitals:   11/02/22 1116 11/02/22 1157  BP: (!) 164/101 (!) 98/59  Pulse: 79 85  Resp: 16 15  Temp: 37.1 C 36.9 C  SpO2: 97% 96%    Last Pain:  Vitals:   11/02/22 1157  TempSrc: Oral  PainSc: 0-No pain                 Racquelle Hyser C Marcelline Temkin

## 2022-11-02 NOTE — Anesthesia Preprocedure Evaluation (Signed)
Anesthesia Evaluation  Patient identified by MRN, date of birth, ID band Patient awake    Reviewed: Allergy & Precautions, H&P , NPO status , Patient's Chart, lab work & pertinent test results, reviewed documented beta blocker date and time   History of Anesthesia Complications (+) PONV and history of anesthetic complications  Airway Mallampati: II  TM Distance: >3 FB Neck ROM: Full    Dental  (+) Dental Advisory Given, Teeth Intact, Caps   Pulmonary neg pulmonary ROS   Pulmonary exam normal breath sounds clear to auscultation       Cardiovascular hypertension, Pt. on medications and Pt. on home beta blockers + Peripheral Vascular Disease  Normal cardiovascular exam Rhythm:Regular Rate:Normal     Neuro/Psych  Headaches  negative psych ROS   GI/Hepatic Neg liver ROS,GERD  Medicated,,  Endo/Other  negative endocrine ROS    Renal/GU negative Renal ROS  negative genitourinary   Musculoskeletal  (+) Arthritis , Osteoarthritis,    Abdominal   Peds negative pediatric ROS (+)  Hematology negative hematology ROS (+)   Anesthesia Other Findings   Reproductive/Obstetrics negative OB ROS                             Anesthesia Physical Anesthesia Plan  ASA: 2  Anesthesia Plan: General   Post-op Pain Management: Minimal or no pain anticipated   Induction: Intravenous  PONV Risk Score and Plan: Propofol infusion  Airway Management Planned: Nasal Cannula and Natural Airway  Additional Equipment:   Intra-op Plan:   Post-operative Plan:   Informed Consent: I have reviewed the patients History and Physical, chart, labs and discussed the procedure including the risks, benefits and alternatives for the proposed anesthesia with the patient or authorized representative who has indicated his/her understanding and acceptance.     Dental advisory given  Plan Discussed with: CRNA and  Surgeon  Anesthesia Plan Comments:        Anesthesia Quick Evaluation

## 2022-11-05 LAB — SURGICAL PATHOLOGY

## 2022-11-09 ENCOUNTER — Encounter (HOSPITAL_COMMUNITY): Payer: Self-pay | Admitting: Internal Medicine

## 2023-01-09 ENCOUNTER — Encounter: Payer: Self-pay | Admitting: Internal Medicine

## 2023-02-23 IMAGING — CT CT ABD-PELV W/ CM
1 of 3 series · 12 of 32 positions shown, 18 images · IV contrast (APPLIED)
Comparison: None Available.

CLINICAL DATA: RIGHT-sided flank pain.  Dysuria.

EXAM:
CT ABDOMEN AND PELVIS WITH CONTRAST
TECHNIQUE: Multidetector CT imaging of the abdomen and pelvis was performed
using the standard protocol following bolus administration of
intravenous contrast.

[Series 2: abd/pelvis w/cm · axial · 0.84mm/px · z∈[-538,-103]mm · 12 of 103 slices shown, 18 images]
[im 8/103  soft-tissue]
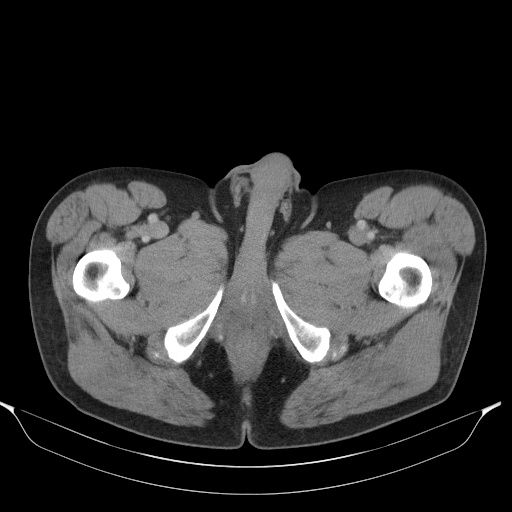
[im 8/103  bone]
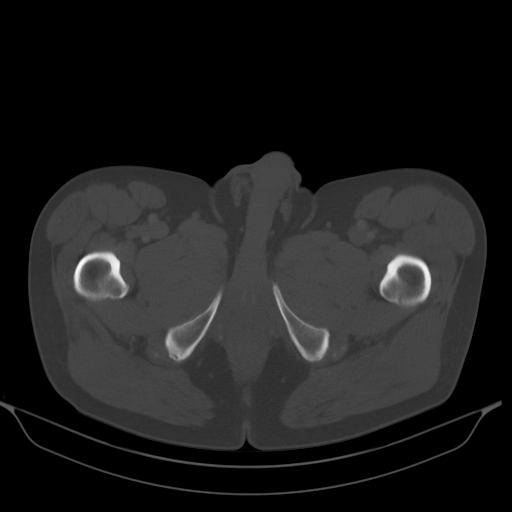
[im 16/103  soft-tissue]
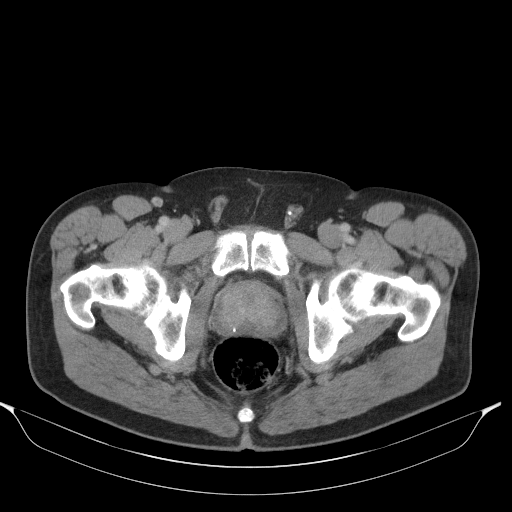
[im 24/103  soft-tissue]
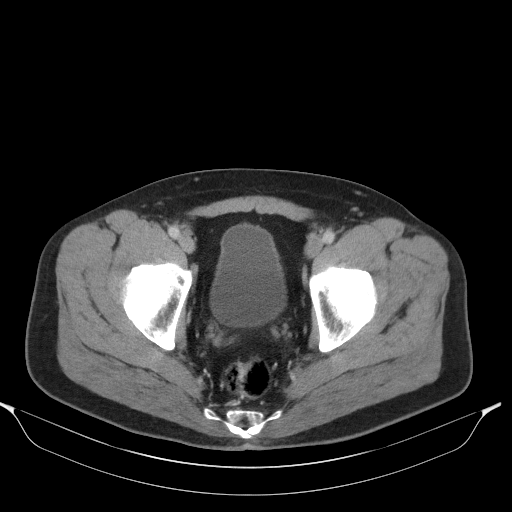
[im 32/103  soft-tissue]
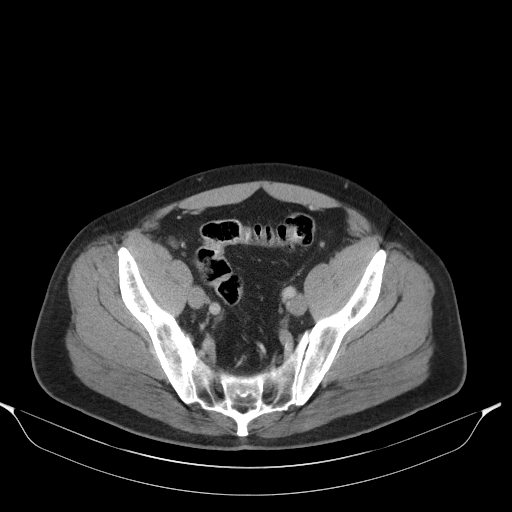
[im 40/103  soft-tissue]
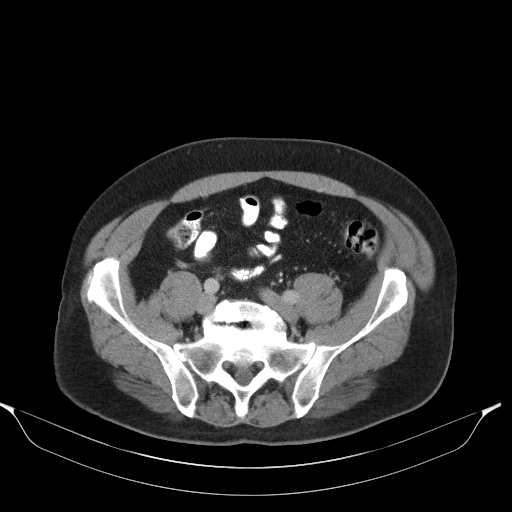
[im 48/103  soft-tissue]
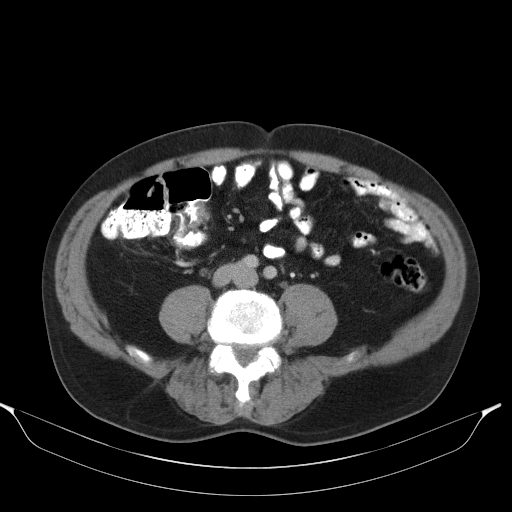
[im 55/103  soft-tissue]
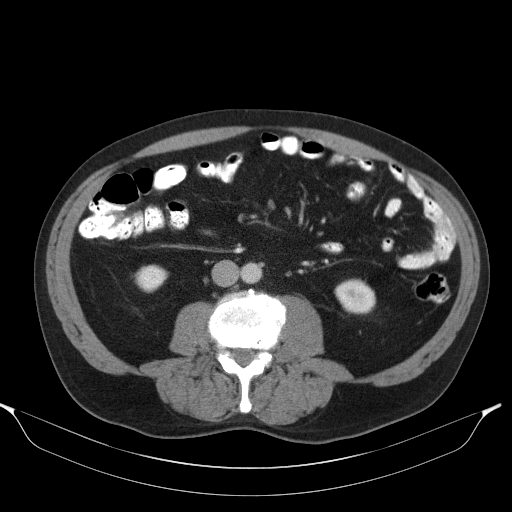
[im 63/103  soft-tissue]
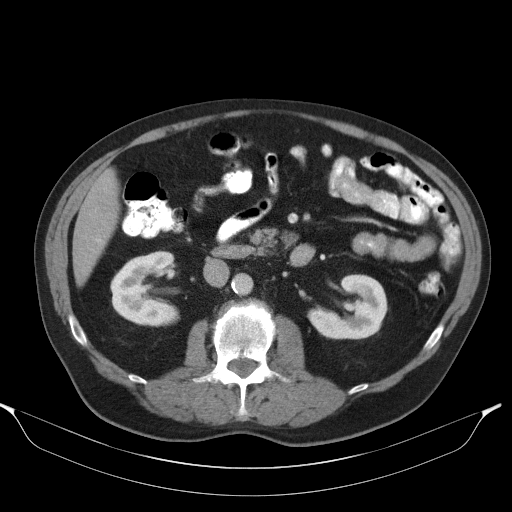
[im 71/103  soft-tissue]
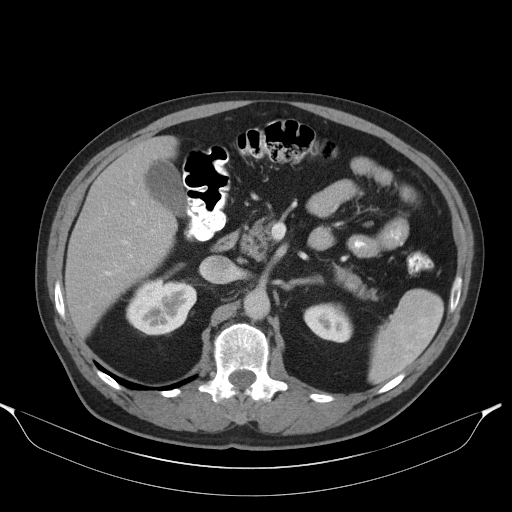
[im 71/103  lung]
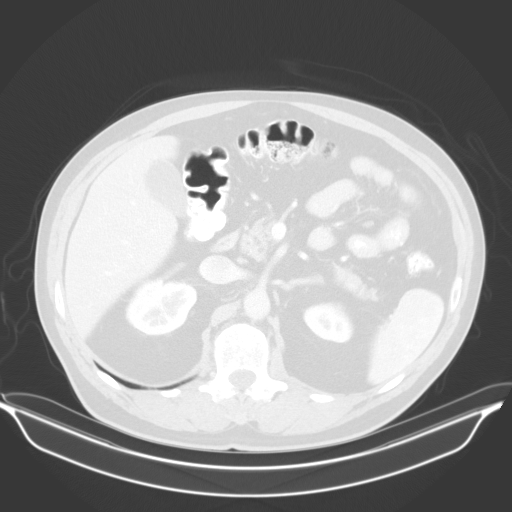
[im 71/103  bone]
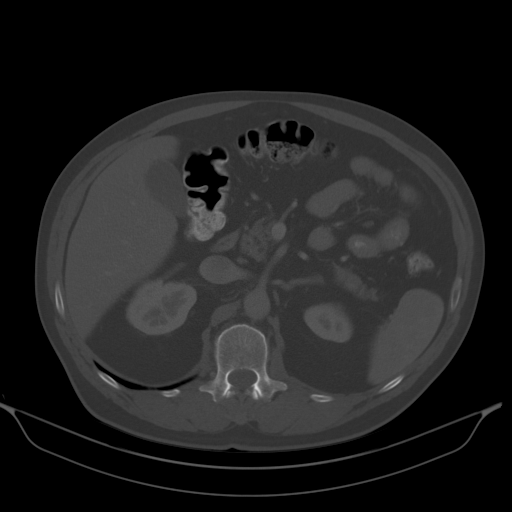
[im 79/103  soft-tissue]
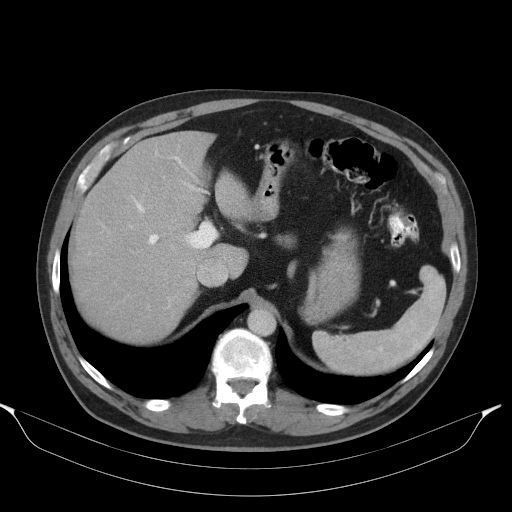
[im 79/103  lung]
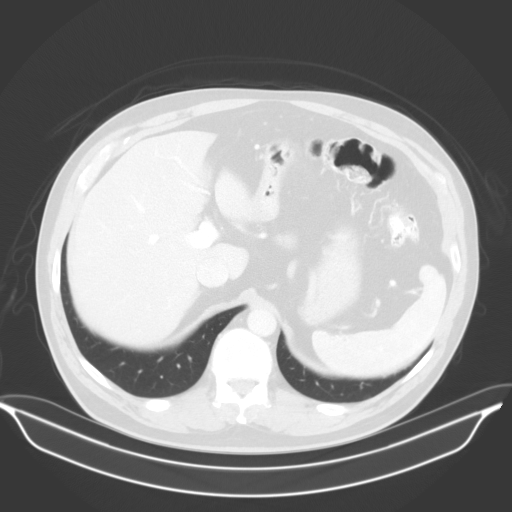
[im 87/103  soft-tissue]
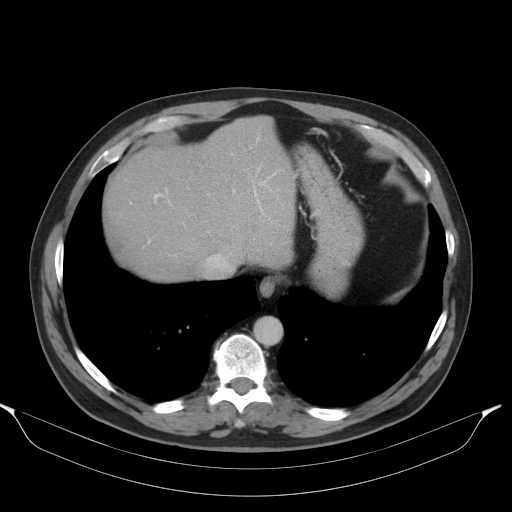
[im 87/103  lung]
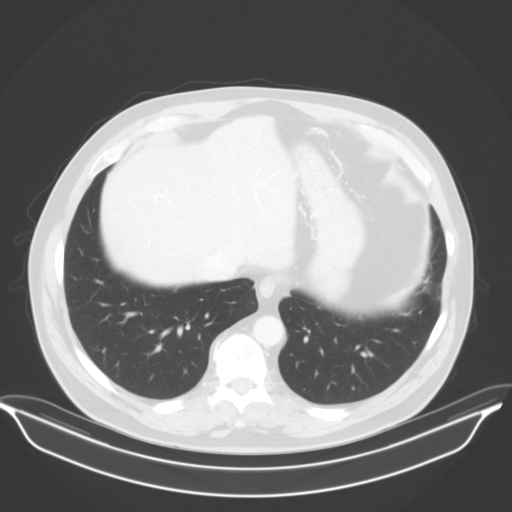
[im 95/103  soft-tissue]
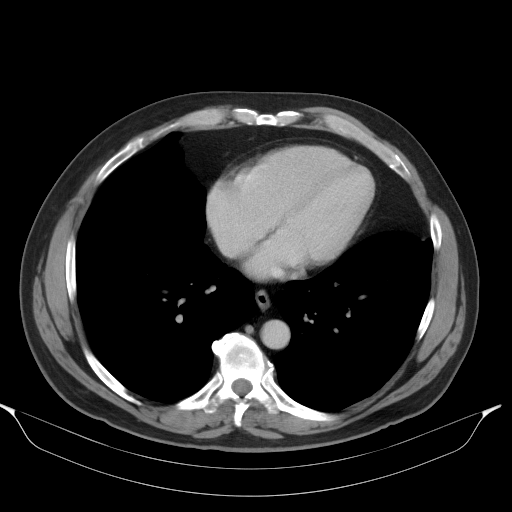
[im 95/103  lung]
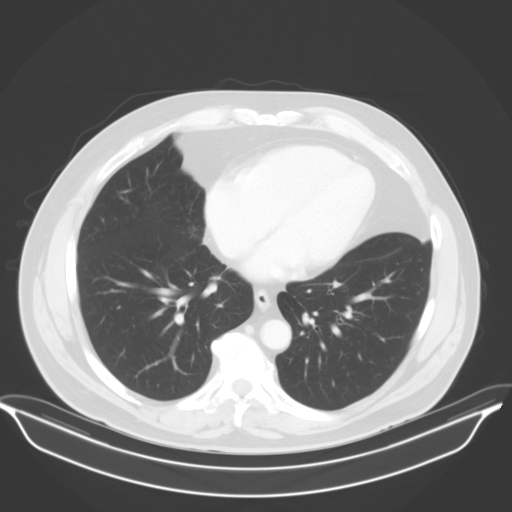

[12 of 32 positions shown; findings below may reference images not displayed]

RADIATION DOSE REDUCTION: This exam was performed according to the
departmental dose-optimization program which includes automated
exposure control, adjustment of the mA and/or kV according to
patient size and/or use of iterative reconstruction technique.

CONTRAST:  100mL 0V3SZ6-O66 IOPAMIDOL (0V3SZ6-O66) INJECTION 61%
FINDINGS: Lower chest: No acute abnormality.

Hepatobiliary: Focal fatty deposition adjacent to the falciform
ligament. Gallbladder is unremarkable. No intrahepatic or
extrahepatic biliary ductal dilation. Portal vein is patent.

Pancreas: Unremarkable. No pancreatic ductal dilatation or
surrounding inflammatory changes.

Spleen: Normal in size without focal abnormality.

Adrenals/Urinary Tract: Adrenal glands are normal. Kidneys enhance
symmetrically. No hydronephrosis. No obstructing nephrolithiasis. No
suspicious renal lesion. Bladder is unremarkable.

Stomach/Bowel: Stomach is within normal limits. Appendix appears
normal. No evidence of bowel wall thickening, distention, or
inflammatory changes.

Vascular/Lymphatic: Aortic atherosclerosis. No enlarged abdominal or
pelvic lymph nodes.

Reproductive: Heterogeneous and enlarged prostate.

Other: No abdominal wall hernia or abnormality. No abdominopelvic
ascites.

Musculoskeletal: Pars defects at L5-S1 with mild grade 1
anterolisthesis.
IMPRESSION: 1. No suspicious CT etiology for RIGHT flank pain identified.
2. Heterogeneously enlarged prostate.

## 2023-04-12 ENCOUNTER — Other Ambulatory Visit (INDEPENDENT_AMBULATORY_CARE_PROVIDER_SITE_OTHER): Payer: Managed Care, Other (non HMO)

## 2023-04-12 ENCOUNTER — Ambulatory Visit: Payer: Managed Care, Other (non HMO)

## 2023-04-12 ENCOUNTER — Encounter: Payer: Self-pay | Admitting: Physician Assistant

## 2023-04-12 ENCOUNTER — Ambulatory Visit: Payer: Managed Care, Other (non HMO) | Admitting: Physician Assistant

## 2023-04-12 VITALS — BP 138/88 | HR 77 | Ht 72.0 in | Wt 215.0 lb

## 2023-04-12 DIAGNOSIS — R413 Other amnesia: Secondary | ICD-10-CM | POA: Insufficient documentation

## 2023-04-12 LAB — VITAMIN B12: Vitamin B-12: 178 pg/mL — ABNORMAL LOW (ref 211–911)

## 2023-04-12 LAB — TSH: TSH: 1.63 u[IU]/mL (ref 0.35–5.50)

## 2023-04-12 NOTE — Patient Instructions (Signed)
It was a pleasure to see you today at our office.   Recommendations:  Neurocognitive evaluation at our office  MRI of the brain, the radiology office will call you to arrange you appointment   Check labs today   Agree with sleep study Recommend psychotherapy for anxiety Follow up in  1 month  For guidance regarding [facility] and if placement were needed at the facility, contact Sidney Ace, Social Worker tel: [phone]  For assessment of decision of mental capacity and competency:  Call Dr. Erick Blinks, geriatric psychiatrist at [phone] Counseling regarding caregiver distress, including caregiver depression, anxiety and issues regarding community resources, adult day care programs, adult living facilities, or memory care questions:  please contact your  Primary Doctor's Social Worker   Whom to call: Memory  decline, memory medications: Call our office [phone]  For psychiatric meds, mood meds: Please have your primary care physician manage these medications.  If you have any severe symptoms of a stroke, or other severe issues such as confusion,severe chills or fever, etc call 911 or go to the ER as you may need to be evaluated further    RECOMMENDATIONS FOR ALL PATIENTS WITH MEMORY PROBLEMS: 1. Continue to exercise (Recommend 30 minutes of walking everyday, or 3 hours every week) 2. Increase social interactions - continue going to [place] and enjoy social gatherings with friends and family 3. Eat healthy, avoid fried foods and eat more fruits and vegetables 4. Maintain adequate blood pressure, blood sugar, and blood cholesterol level. Reducing the risk of stroke and cardiovascular disease also helps promoting better memory. 5. Avoid stressful situations. Live a simple life and avoid aggravations. Organize your time and prepare for the next day in anticipation. 6. Sleep well, avoid any interruptions of sleep and avoid any distractions in the bedroom  that may interfere with adequate sleep quality 7. Avoid sugar, avoid sweets as there is a strong link between excessive sugar intake, diabetes, and cognitive impairment We discussed the Mediterranean diet, which has been shown to help patients reduce the risk of progressive memory disorders and reduces cardiovascular risk. This includes eating fish, eat fruits and green leafy vegetables, nuts like almonds and hazelnuts, walnuts, and also use olive oil. Avoid fast foods and fried foods as much as possible. Avoid sweets and sugar as sugar use has been linked to worsening of memory function.  There is always a concern of gradual progression of memory problems. If this is the case, then we may need to adjust level of care according to patient needs. Support, both to the patient and caregiver, should then be put into place.      You have been referred for a neuropsychological evaluation (i.e., evaluation of memory and thinking abilities). Please bring someone with you to this appointment if possible, as it is helpful for the doctor to hear from both you and another adult who knows you well. Please bring eyeglasses and hearing aids if you wear them.    The evaluation will take approximately 3 hours and has two parts:   The first part is a clinical interview with the neuropsychologist (Dr. Milbert Coulter or Dr. Roseanne Reno). During the interview, the neuropsychologist will speak with you and the individual you brought to the appointment.    The second part of the evaluation is testing with the doctor's technician Annabelle Harman or Selena Batten). During the testing, the technician will ask you to remember different types of material, solve problems, and answer some questionnaires. Your family member will  not be present for this portion of the evaluation.   Please note: We must reserve several hours of the neuropsychologist's time and the psychometrician's time for your evaluation appointment. As such, there is a No-Show fee of $100. If  you are unable to attend any of your appointments, please contact our office as soon as possible to reschedule.    FALL PRECAUTIONS: Be cautious when walking. Scan the area for obstacles that may increase the risk of trips and falls. When getting up in the mornings, sit up at the edge of the bed for a few minutes before getting out of bed. Consider elevating the bed at the head end to avoid drop of blood pressure when getting up. Walk always in a well-lit room (use night lights in the walls). Avoid area rugs or power cords from appliances in the middle of the walkways. Use a walker or a cane if necessary and consider physical therapy for balance exercise. Get your eyesight checked regularly.  FINANCIAL OVERSIGHT: Supervision, especially oversight when making financial decisions or transactions is also recommended.  HOME SAFETY: Consider the safety of the kitchen when operating appliances like stoves, microwave oven, and blender. Consider having supervision and share cooking responsibilities until no longer able to participate in those. Accidents with firearms and other hazards in the house should be identified and addressed as well.   ABILITY TO BE LEFT ALONE: If patient is unable to contact 911 operator, consider using LifeLine, or when the need is there, arrange for someone to stay with patients. Smoking is a fire hazard, consider supervision or cessation. Risk of wandering should be assessed by caregiver and if detected at any point, supervision and safe proof recommendations should be instituted.  MEDICATION SUPERVISION: Inability to self-administer medication needs to be constantly addressed. Implement a mechanism to ensure safe administration of the medications.   DRIVING: Regarding driving, in patients with progressive memory problems, driving will be impaired. We advise to have someone else do the driving if trouble finding directions or if minor accidents are reported. Independent driving  assessment is available to determine safety of driving.   If you are interested in the driving assessment, you can contact the following:  [employer] in [place]  [place] [phone] or [phone]    [place] refers to food and lifestyle choices that are based on the traditions of countries located on the [employer]. This way of eating has been shown to help prevent certain conditions and improve outcomes for people who have chronic diseases, like kidney disease and heart disease. What are tips for following this plan? Lifestyle  Cook and eat meals together with your family, when possible. Drink enough fluid to keep your urine clear or pale yellow. Be physically active every day. This includes: Aerobic exercise like running or swimming. Leisure activities like gardening, walking, or housework. Get 7-8 hours of sleep each night. If recommended by your health care provider, drink red wine in moderation. This means 1 glass a day for nonpregnant women and 2 glasses a day for men. A glass of wine equals 5 oz (150 mL). Reading food labels  Check the serving size of packaged foods. For foods such as rice and pasta, the serving size refers to the amount of cooked product, not dry. Check the total fat in packaged foods. Avoid foods that have saturated fat or trans fats. Check the ingredients list for  added sugars, such as corn syrup. Shopping  At the grocery store, buy most of your food from the areas near the walls of the store. This includes: Fresh fruits and vegetables (produce). Grains, beans, nuts, and seeds. Some of these may be available in unpackaged forms or large amounts (in bulk). Fresh seafood. Poultry and eggs. Low-fat dairy products. Buy whole ingredients instead of prepackaged foods. Buy fresh fruits and vegetables  in-season from local farmers markets. Buy frozen fruits and vegetables in resealable bags. If you do not have access to quality fresh seafood, buy precooked frozen shrimp or canned fish, such as tuna, salmon, or sardines. Buy small amounts of raw or cooked vegetables, salads, or olives from the deli or salad bar at your store. Stock your pantry so you always have certain foods on hand, such as olive oil, canned tuna, canned tomatoes, rice, pasta, and beans. Cooking  Cook foods with extra-virgin olive oil instead of using butter or other vegetable oils. Have meat as a side dish, and have vegetables or grains as your main dish. This means having meat in small portions or adding small amounts of meat to foods like pasta or stew. Use beans or vegetables instead of meat in common dishes like chili or lasagna. Experiment with different cooking methods. Try roasting or broiling vegetables instead of steaming or sauteing them. Add frozen vegetables to soups, stews, pasta, or rice. Add nuts or seeds for added healthy fat at each meal. You can add these to yogurt, salads, or vegetable dishes. Marinate fish or vegetables using olive oil, lemon juice, garlic, and fresh herbs. Meal planning  Plan to eat 1 vegetarian meal one day each week. Try to work up to 2 vegetarian meals, if possible. Eat seafood 2 or more times a week. Have healthy snacks readily available, such as: Vegetable sticks with hummus. Greek yogurt. Fruit and nut trail mix. Eat balanced meals throughout the week. This includes: Fruit: 2-3 servings a day Vegetables: 4-5 servings a day Low-fat dairy: 2 servings a day Fish, poultry, or lean meat: 1 serving a day Beans and legumes: 2 or more servings a week Nuts and seeds: 1-2 servings a day Whole grains: 6-8 servings a day Extra-virgin olive oil: 3-4 servings a day Limit red meat and sweets to only a few servings a month What are my food choices? Mediterranean  diet Recommended Grains: Whole-grain pasta. Brown rice. Bulgar wheat. Polenta. Couscous. Whole-wheat bread. [place]. Vegetables: Artichokes. Beets. Broccoli. Cabbage. Carrots. Eggplant. Green beans. Chard. Kale. Spinach. Onions. Leeks. Peas. Squash. Tomatoes. Peppers. Radishes. Fruits: Apples. Apricots. Avocado. Berries. Bananas. Cherries. Dates. Figs. Grapes. Lemons. Melon. Oranges. Peaches. Plums. Pomegranate. Meats and other protein foods: Beans. Almonds. Sunflower seeds. Pine nuts. Peanuts. [place]. Salmon. Scallops. Shrimp. [place]. Tilapia. Clams. Oysters. Eggs. Dairy: Low-fat milk. Cheese. Greek yogurt. Beverages: Water. Red wine. Herbal tea. Fats and oils: Extra virgin olive oil. Avocado oil. Grape seed oil. Sweets and desserts: [place] yogurt with honey. Baked apples. Poached pears. Trail mix. Seasoning and other foods: Basil. Cilantro. Coriander. Cumin. Mint. Parsley. Sage. Rosemary. Tarragon. Garlic. Oregano. Thyme. Pepper. Balsalmic vinegar. Tahini. Hummus. Tomato sauce. Olives. Mushrooms. Limit these Grains: Prepackaged pasta or rice dishes. Prepackaged cereal with added sugar. Vegetables: Deep fried potatoes (french fries). Fruits: Fruit canned in syrup. Meats and other protein foods: Beef. Pork. Lamb. Poultry with skin. Hot dogs. Berniece Salines. Dairy: Ice cream. Sour cream. Whole milk. Beverages: Juice. Sugar-sweetened soft drinks. Beer. Liquor and spirits. Fats and oils: Butter. Canola oil. Vegetable oil.  Beef fat (tallow). Lard. Sweets and desserts: Cookies. Cakes. Pies. Candy. Seasoning and other foods: Mayonnaise. Premade sauces and marinades. The items listed may not be a complete list. Talk with your dietitian about what dietary choices are right for you. Summary The Mediterranean diet includes both food and lifestyle choices. Eat a variety of fresh fruits and vegetables, beans, nuts, seeds, and whole grains. Limit the amount of red meat and sweets that you eat. Talk with your  health care provider about whether it is safe for you to drink red wine in moderation. This means 1 glass a day for nonpregnant women and 2 glasses a day for men. A glass of wine equals 5 oz (150 mL). This information is not intended to replace advice given to you by your health care provider. Make sure you discuss any questions you have with your health care provider. Document Released: 07/12/2016 Document Revised: 08/14/2016 Document Reviewed: 07/12/2016 Elsevier Interactive Patient Education  2017 [employer].

## 2023-04-12 NOTE — Progress Notes (Signed)
Assessment/Plan:   Jeremy [name] is a very pleasant 62 y.o. year old RH male with a history of hypertension, hyperlipidemia, history of PE on chronic Coumadin, insomnia, chronic cholecystitis, arthritis, GERD, anxiety seen today for evaluation of memory loss. MoCA today is 19/30.  Patient is still able to perform some ADLs, and continues to drive although would recommend that this is being monitored closely .    Memory Impairment  MRI brain without contrast to assess for underlying structural abnormality and assess vascular load  Neurocognitive testing to further evaluate cognitive concerns and determine other underlying cause of memory changes, including potential contribution from sleep, anxiety, or depression  Check B12, TSH Discussed with the patient the need for sleep studies for insomnia, he is to discuss this with his PCP Continue to control mood as per PCP Recommend psychotherapy for anxiety Recommend good control of cardiovascular risk factors  Folllow up in 1 month to discuss the results of MRI Monitor driving carefully, recommend that he use a copilot in short distances   Subjective:   The patient is accompanied by his wife who supplements the history.   How long did patient have memory difficulties?  About 1 year ago, the patient reported having difficulty remembering recent conversations and people names and has become worse, provoking anxiety which is hindering him from performing other activities.  He is to be very good with numbers, and now he has significant difficulty.  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  Endorsed, not often  Disoriented when walking into a room?  Patient denies   Leaving objects in unusual places?  Misplaces things and cannot remember where they are such as the watch, keys, telephone, glasses, etc.  Wandering behavior?  denies   Any  personality changes since last visit?  He becomes frustrated with his cognitive difficulties, and reports that his anxiety is worse  Any history of depression?:  Patient denies, he has never been seen by psychotherapist or psychiatrist. Hallucinations or paranoia?  Patient denies   Seizures?   Patient denies    Any sleep changes?  Insomnia is worse, now sleeping about 3 hours at night.  His PCP placed him on Klonopin, which has not been helping, and he is to discuss with his physician a new medication.  He also discussed with his PCP future sleep studies, has been entertaining this idea.    Occasionally  vivid dreams, REM behavior or sleepwalking."Sometimes I chase someone in my dreams and feel I am running " Sleep apnea?  Patient denies   Any hygiene concerns?  Patient denies   Independent of bathing and dressing?  Endorsed  Does the patient needs help with medications?  Patient is in charge   Who is in charge of the finances?  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    Any changes in appetite?  denies     Patient have trouble swallowing? denies   Does the patient cook?  No  Any kitchen accidents such as leaving the stove on? denies   Any headaches?   History of migraines, on Imitrex for a long time, has not had any for about 3 years.  Chronic back pain ?denies   Ambulates with difficulty?  denies  Recent falls or head injuries? Last week he had a mechanical fall hitting his elbow, no head injury and no LOC Vision changes? denies   Unilateral weakness, numbness or tingling? denies   Any tremors?   denies   Any anosmia?  denies   Any incontinence of urine? Getting worse.  Any bowel dysfunction?  Chronic diarrhea  Patient lives  wife History of heavy alcohol intake? denies   History of heavy tobacco use? denies   Family history of dementia? MGM with  dementia of unknown type and one uncle with PD dementia   Does patient drive?  "I get anxious driving "and gets hesitant where to turn, even in roads that has been before, decisions are "skewed.  ". Drives long distances at night  Masters in professional counseling    Past Medical History:  Diagnosis Date   Arthritis    both knees    GERD (gastroesophageal reflux disease)    H/O renal calculi    has been passed spontaneously but also had cystoscopy for one    Headache    migraines on occasion - used imitrex 5- 6 yrs. ago    History of kidney stones    cystoscopy x1, also has past spontaneously   Hypertension    Irritable bowel syndrome (IBS)    PE (pulmonary embolism) 1992   post op- 1 week   Peripheral vascular disease (HCC)    post op DVT-  approx. 2009, [facility] at [facility]    PONV (postoperative nausea and vomiting)    Seasonal allergies      Past Surgical History:  Procedure Laterality Date   ANKLE SURGERY Right    repair of fracture & repair of torn tendons.   BIOPSY  11/02/2022   Procedure: BIOPSY;  Surgeon: Lanelle Bal, DO;  Location: [facility];  Service: Endoscopy;;   CARDIAC CATHETERIZATION     diagnostic only   CHOLECYSTECTOMY  08/2022   COLONOSCOPY WITH PROPOFOL N/A 11/02/2022   Procedure: COLONOSCOPY WITH PROPOFOL;  Surgeon: Lanelle Bal, DO;  Location: [facility];  Service: Endoscopy;  Laterality: N/A;  1:15pm, asa 3   KNEE SURGERY     8 arthroscopies & one repair of tendon- R    PERIPHERALLY INSERTED CENTRAL CATHETER INSERTION Right 2001   staph infection in R knee, post surgical, treated with antibiotics     POLYPECTOMY  11/02/2022   Procedure: POLYPECTOMY;  Surgeon: Lanelle Bal, DO;  Location: [facility];  Service: Endoscopy;;   TONSILLECTOMY     TOTAL KNEE ARTHROPLASTY Left 03/05/2016   Procedure: LEFT TOTAL KNEE ARTHROPLASTY;  Surgeon: Jodi Geralds, MD;  Location: [facility] OR;  Service: Orthopedics;  Laterality: Left;   TOTAL KNEE  ARTHROPLASTY Right 10/22/2016   TOTAL KNEE ARTHROPLASTY Right 10/22/2016   Procedure: TOTAL KNEE ARTHROPLASTY;  Surgeon: Jodi Geralds, MD;  Location: [facility] OR;  Service: Orthopedics;  Laterality: Right;   VOCAL CORD INJECTION  2002   bilateral medialization, implants on both vocal chords , done at [facility].      Allergies  Allergen Reactions   Morphine And Related Nausea Only    Current Outpatient Medications  Medication Instructions   aspirin 81 mg, Oral, Daily   clonazePAM (KLONOPIN) 0.5 mg, Oral, Daily at bedtime   fexofenadine (ALLEGRA) 180 mg, Oral, Daily before breakfast   imipramine (TOFRANIL) 20 mg, Oral, Daily at bedtime   metoprolol succinate (TOPROL-XL) 25 mg, Oral, Daily at bedtime   olmesartan (BENICAR) 40 mg, Oral,  Daily   omeprazole (PRILOSEC) 20-40 mg, Oral, 2 times daily before meals, 40 mg every morning and 20 mg every evening    tamsulosin (FLOMAX) 0.4 mg, Oral, Nightly   warfarin (COUMADIN) 3 mg, Oral, Daily-1800     VITALS:   Vitals:   04/12/23 1016  BP: 138/88  Pulse: 77  SpO2: 96%  Weight: 215 lb (97.5 kg)  Height: 6' (1.829 m)       No data to display          PHYSICAL EXAM   HEENT:  Normocephalic, atraumatic. The mucous membranes are moist. The superficial temporal arteries are without ropiness or tenderness. Cardiovascular: Regular rate and rhythm. Lungs: Clear to auscultation bilaterally. Neck: There are no carotid bruits noted bilaterally.  NEUROLOGICAL:    04/12/2023   10:10 AM  Montreal Cognitive Assessment   Visuospatial/ Executive (0/5) 1  Naming (0/3) 3  Attention: Read list of digits (0/2) 2  Attention: Read list of letters (0/1) 1  Attention: Serial 7 subtraction starting at 100 (0/3) 1  Language: Repeat phrase (0/2) 0  Language : Fluency (0/1) 0  Abstraction (0/2) 2  Delayed Recall (0/5) 4  Orientation (0/6) 5  Total 19  Adjusted Score (based on education) 19        No data to display           Orientation:   Alert and oriented to person, not to place or time.  No aphasia or dysarthria. Fund of knowledge is reduced. Recent and remote memory impaired.  Attention and concentration are reduced.  Able to name objects and unable to repeat phrases. Delayed recall 4/5 Cranial nerves: There is good facial symmetry. Extraocular muscles are intact and visual fields are full to confrontational testing. Speech is fluent and clear. No tongue deviation. Hearing is intact to conversational tone. Tone: Tone is good throughout. Sensation: Sensation is intact to Fi[place]lla touch and pinprick throughout. Vibration is intact at the bilateral big toe.There is no extinction with double simultaneous stimulation. There is no sensory dermatomal level identified. Coordination: The patient has no difficulty with RAM's or FNF bilaterally. Normal finger to nose  Motor: Strength is 5/5 in the bilateral upper and lower extremities. There is no pronator drift. There are no fasciculations noted. DTR's: Deep tendon reflexes are 2/4 at the bilateral biceps, triceps, brachioradialis, patella and achilles.  Plantar responses are downgoing bilaterally. Gait and Station: The patient is able to ambulate without difficulty.The patient is able to heel toe walk without any difficulty.The patient is able to ambulate in a tandem fashion. The patient is able to stand in the Romberg position.     Thank you for allowing [place] the opportunity to participate in the care of this nice patient. Please do not hesitate to contact us for any questions or concerns.   Total time spent on today's visit was 50 minutes dedicated to this patient today, preparing to see patient, examining the patient, ordering tests and/or medications and counseling the patient, documenting clinical information in the EHR or other health record, independently interpreting results and communicating results to the patient/family, discussing treatment and goals, answering patient's questions and  coordinating care.  Cc:  Richardean Chimera, MD  Marlowe Kays 04/12/2023 12:26 PM

## 2023-04-12 NOTE — Progress Notes (Signed)
vitamin B12 is low.  Recommend starting on vitamin B12 1000 mcg daily.  Follow-up with PCP.  Thank you   Thyroid is normal

## 2023-04-19 ENCOUNTER — Telehealth: Payer: Self-pay | Admitting: Anesthesiology

## 2023-04-19 NOTE — Telephone Encounter (Signed)
Pt left message with AN stating he is returning a call for lab results.

## 2023-04-19 NOTE — Telephone Encounter (Signed)
Left detailed message to start b12 1000

## 2023-04-19 NOTE — Telephone Encounter (Signed)
Labs are resulted, will call back shortly

## 2023-05-01 NOTE — Progress Notes (Deleted)
05/02/23- 62 yoM never smoker for sleep evaluation with concern of OSA courtesy of Dr Donzetta Sprung Medical problem list includes HTN, hx Pulmonary Embolism, Peripheral Vascular Disease, GERD, IBS, Osteoarthritis, Insomnia, Seasonal Allergy, Memory Impairment,  -clonazepam 0.5, imipramine 10,  Epworth score- Body weight today-

## 2023-05-02 ENCOUNTER — Institutional Professional Consult (permissible substitution): Payer: Managed Care, Other (non HMO) | Admitting: Internal Medicine

## 2023-05-05 ENCOUNTER — Other Ambulatory Visit (HOSPITAL_COMMUNITY): Payer: Managed Care, Other (non HMO)

## 2023-05-08 ENCOUNTER — Ambulatory Visit (HOSPITAL_COMMUNITY): Admission: RE | Admit: 2023-05-08 | Payer: Managed Care, Other (non HMO) | Source: Ambulatory Visit

## 2023-05-13 ENCOUNTER — Encounter: Payer: Self-pay | Admitting: Physician Assistant

## 2023-05-14 ENCOUNTER — Ambulatory Visit (HOSPITAL_COMMUNITY): Payer: Managed Care, Other (non HMO)

## 2023-05-20 ENCOUNTER — Telehealth: Payer: Self-pay | Admitting: Physician Assistant

## 2023-05-20 NOTE — Telephone Encounter (Signed)
Would come after mri per Marlowe Kays, PA-C

## 2023-05-20 NOTE — Telephone Encounter (Signed)
Pt does not have the MRI until late July needs to know if he needs to keep the appt with you for the 1 month follow up appt with Huntley Dec tomorrow

## 2023-05-21 ENCOUNTER — Ambulatory Visit: Payer: Managed Care, Other (non HMO) | Admitting: Physician Assistant

## 2023-05-28 ENCOUNTER — Encounter: Payer: Self-pay | Admitting: Physician Assistant

## 2023-06-02 ENCOUNTER — Ambulatory Visit
Admission: RE | Admit: 2023-06-02 | Discharge: 2023-06-02 | Disposition: A | Discharge status: Home / Self Care | Payer: Managed Care, Other (non HMO) | Source: Ambulatory Visit | Attending: Physician Assistant | Admitting: Physician Assistant

## 2023-06-02 DIAGNOSIS — R413 Other amnesia: Secondary | ICD-10-CM

## 2023-06-07 ENCOUNTER — Encounter: Payer: Self-pay | Admitting: Physician Assistant

## 2023-06-07 ENCOUNTER — Ambulatory Visit: Payer: Managed Care, Other (non HMO) | Admitting: Physician Assistant

## 2023-06-07 VITALS — BP 160/80 | HR 85 | Resp 18 | Ht 72.0 in | Wt 214.0 lb

## 2023-06-07 DIAGNOSIS — R413 Other amnesia: Secondary | ICD-10-CM

## 2023-06-07 NOTE — Patient Instructions (Signed)
It was a pleasure to see you today at our office.   Recommendations:  Neurocognitive evaluation at our office  Continue B12 daily    Agree with sleep study Recommend psychotherapy for anxiety Follow up after Neuropsych evaluation      For assessment of decision of mental capacity and competency:  Call Dr. Erick Blinks, geriatric psychiatrist at [phone] Counseling regarding caregiver distress, including caregiver depression, anxiety and issues regarding community resources, adult day care programs, adult living facilities, or memory care questions:  please contact your  Primary Doctor's Social Worker   Whom to call: Memory  decline, memory medications: Call our office [phone]  For psychiatric meds, mood meds: Please have your primary care physician manage these medications.  If you have any severe symptoms of a stroke, or other severe issues such as confusion,severe chills or fever, etc call 911 or go to the ER as you may need to be evaluated further    RECOMMENDATIONS FOR ALL PATIENTS WITH MEMORY PROBLEMS: 1. Continue to exercise (Recommend 30 minutes of walking everyday, or 3 hours every week) 2. Increase social interactions - continue going to [place] and enjoy social gatherings with friends and family 3. Eat healthy, avoid fried foods and eat more fruits and vegetables 4. Maintain adequate blood pressure, blood sugar, and blood cholesterol level. Reducing the risk of stroke and cardiovascular disease also helps promoting better memory. 5. Avoid stressful situations. Live a simple life and avoid aggravations. Organize your time and prepare for the next day in anticipation. 6. Sleep well, avoid any interruptions of sleep and avoid any distractions in the bedroom that may interfere with adequate sleep quality 7. Avoid sugar, avoid sweets as there is a strong link between excessive sugar intake, diabetes, and cognitive impairment We discussed the Mediterranean diet, which has  been shown to help patients reduce the risk of progressive memory disorders and reduces cardiovascular risk. This includes eating fish, eat fruits and green leafy vegetables, nuts like almonds and hazelnuts, walnuts, and also use olive oil. Avoid fast foods and fried foods as much as possible. Avoid sweets and sugar as sugar use has been linked to worsening of memory function.  There is always a concern of gradual progression of memory problems. If this is the case, then we may need to adjust level of care according to patient needs. Support, both to the patient and caregiver, should then be put into place.      You have been referred for a neuropsychological evaluation (i.e., evaluation of memory and thinking abilities). Please bring someone with you to this appointment if possible, as it is helpful for the doctor to hear from both you and another adult who knows you well. Please bring eyeglasses and hearing aids if you wear them.    The evaluation will take approximately 3 hours and has two parts:   The first part is a clinical interview with the neuropsychologist (Dr. Milbert Coulter or Dr. Roseanne Reno). During the interview, the neuropsychologist will speak with you and the individual you brought to the appointment.    The second part of the evaluation is testing with the doctor's technician Annabelle Harman or Selena Batten). During the testing, the technician will ask you to remember different types of material, solve problems, and answer some questionnaires. Your family member will not be present for this portion of the evaluation.   Please note: We must reserve several hours of the neuropsychologist's time and the psychometrician's time for your evaluation appointment. As such, there is a  No-Show fee of $100. If you are unable to attend any of your appointments, please contact our office as soon as possible to reschedule.    FALL PRECAUTIONS: Be cautious when walking. Scan the area for obstacles that may increase the risk of  trips and falls. When getting up in the mornings, sit up at the edge of the bed for a few minutes before getting out of bed. Consider elevating the bed at the head end to avoid drop of blood pressure when getting up. Walk always in a well-lit room (use night lights in the walls). Avoid area rugs or power cords from appliances in the middle of the walkways. Use a walker or a cane if necessary and consider physical therapy for balance exercise. Get your eyesight checked regularly.  FINANCIAL OVERSIGHT: Supervision, especially oversight when making financial decisions or transactions is also recommended.  HOME SAFETY: Consider the safety of the kitchen when operating appliances like stoves, microwave oven, and blender. Consider having supervision and share cooking responsibilities until no longer able to participate in those. Accidents with firearms and other hazards in the house should be identified and addressed as well.   ABILITY TO BE LEFT ALONE: If patient is unable to contact 911 operator, consider using LifeLine, or when the need is there, arrange for someone to stay with patients. Smoking is a fire hazard, consider supervision or cessation. Risk of wandering should be assessed by caregiver and if detected at any point, supervision and safe proof recommendations should be instituted.  MEDICATION SUPERVISION: Inability to self-administer medication needs to be constantly addressed. Implement a mechanism to ensure safe administration of the medications.   DRIVING: Regarding driving, in patients with progressive memory problems, driving will be impaired. We advise to have someone else do the driving if trouble finding directions or if minor accidents are reported. Independent driving assessment is available to determine safety of driving.   If you are interested in the driving assessment, you can contact the following:  [facility] in [place] [phone]  [facility] [phone]  [facility] [phone] or [phone]    Mediterranean Diet A Mediterranean diet refers to food and lifestyle choices that are based on the traditions of countries located on the [employer]. This way of eating has been shown to help prevent certain conditions and improve outcomes for people who have chronic diseases, like kidney disease and heart disease. What are tips for following this plan? Lifestyle  Cook and eat meals together with your family, when possible. Drink enough fluid to keep your urine clear or pale yellow. Be physically active every day. This includes: Aerobic exercise like running or swimming. Leisure activities like gardening, walking, or housework. Get 7-8 hours of sleep each night. If recommended by your health care provider, drink red wine in moderation. This means 1 glass a day for nonpregnant women and 2 glasses a day for men. A glass of wine equals 5 oz (150 mL). Reading food labels  Check the serving size of packaged foods. For foods such as rice and pasta, the serving size refers to the amount of cooked product, not dry. Check the total fat in packaged foods. Avoid foods that have saturated fat or trans fats. Check the ingredients list for added sugars, such as corn syrup. Shopping  At the grocery store, buy most of your food from the areas near the walls of the store. This includes: Fresh fruits and vegetables (produce). Grains, beans,  nuts, and seeds. Some of these may be available in unpackaged forms or large amounts (in bulk). Fresh seafood. Poultry and eggs. Low-fat dairy products. Buy whole ingredients instead of prepackaged foods. Buy fresh fruits and vegetables in-season from local farmers markets. Buy frozen fruits and vegetables in resealable bags. If you do not have access to quality fresh seafood, buy precooked frozen shrimp or canned fish, such as tuna, salmon, or  sardines. Buy small amounts of raw or cooked vegetables, salads, or olives from the deli or salad bar at your store. Stock your pantry so you always have certain foods on hand, such as olive oil, canned tuna, canned tomatoes, rice, pasta, and beans. Cooking  Cook foods with extra-virgin olive oil instead of using butter or other vegetable oils. Have meat as a side dish, and have vegetables or grains as your main dish. This means having meat in small portions or adding small amounts of meat to foods like pasta or stew. Use beans or vegetables instead of meat in common dishes like chili or lasagna. Experiment with different cooking methods. Try roasting or broiling vegetables instead of steaming or sauteing them. Add frozen vegetables to soups, stews, pasta, or rice. Add nuts or seeds for added healthy fat at each meal. You can add these to yogurt, salads, or vegetable dishes. Marinate fish or vegetables using olive oil, lemon juice, garlic, and fresh herbs. Meal planning  Plan to eat 1 vegetarian meal one day each week. Try to work up to 2 vegetarian meals, if possible. Eat seafood 2 or more times a week. Have healthy snacks readily available, such as: Vegetable sticks with hummus. Greek yogurt. Fruit and nut trail mix. Eat balanced meals throughout the week. This includes: Fruit: 2-3 servings a day Vegetables: 4-5 servings a day Low-fat dairy: 2 servings a day Fish, poultry, or lean meat: 1 serving a day Beans and legumes: 2 or more servings a week Nuts and seeds: 1-2 servings a day Whole grains: 6-8 servings a day Extra-virgin olive oil: 3-4 servings a day Limit red meat and sweets to only a few servings a month What are my food choices? Mediterranean diet Recommended Grains: Whole-grain pasta. Brown rice. Bulgar wheat. Polenta. Couscous. Whole-wheat bread. Orpah Cobb. Vegetables: Artichokes. Beets. Broccoli. Cabbage. Carrots. Eggplant. Green beans. Chard. Kale. Spinach.  Onions. Leeks. Peas. Squash. Tomatoes. Peppers. Radishes. Fruits: Apples. Apricots. Avocado. Berries. Bananas. Cherries. Dates. Figs. Grapes. Lemons. Melon. Oranges. Peaches. Plums. Pomegranate. Meats and other protein foods: Beans. Almonds. Sunflower seeds. Pine nuts. Peanuts. Cod. Salmon. Scallops. Shrimp. Tuna. Tilapia. Clams. Oysters. Eggs. Dairy: Low-fat milk. Cheese. Greek yogurt. Beverages: Water. Red wine. Herbal tea. Fats and oils: Extra virgin olive oil. Avocado oil. Grape seed oil. Sweets and desserts: [place] yogurt with honey. Baked apples. Poached pears. Trail mix. Seasoning and other foods: Basil. Cilantro. Coriander. Cumin. Mint. Parsley. Sage. Rosemary. Tarragon. Garlic. Oregano. Thyme. Pepper. Balsalmic vinegar. Tahini. Hummus. Tomato sauce. Olives. Mushrooms. Limit these Grains: Prepackaged pasta or rice dishes. Prepackaged cereal with added sugar. Vegetables: Deep fried potatoes (french fries). Fruits: Fruit canned in syrup. Meats and other protein foods: Beef. Pork. Lamb. Poultry with skin. Hot dogs. Tomasa Blase. Dairy: Ice cream. Sour cream. Whole milk. Beverages: Juice. Sugar-sweetened soft drinks. Beer. Liquor and spirits. Fats and oils: Butter. Canola oil. Vegetable oil. Beef fat (tallow). Lard. Sweets and desserts: Cookies. Cakes. Pies. Candy. Seasoning and other foods: Mayonnaise. Premade sauces and marinades. The items listed may not be a complete list. Talk with your dietitian about what  dietary choices are right for you. Summary The Mediterranean diet includes both food and lifestyle choices. Eat a variety of fresh fruits and vegetables, beans, nuts, seeds, and whole grains. Limit the amount of red meat and sweets that you eat. Talk with your health care provider about whether it is safe for you to drink red wine in moderation. This means 1 glass a day for nonpregnant women and 2 glasses a day for men. A glass of wine equals 5 oz (150 mL). This information is not  intended to replace advice given to you by your health care provider. Make sure you discuss any questions you have with your health care provider. Document Released: 07/12/2016 Document Revised: 08/14/2016 Document Reviewed: 07/12/2016 Elsevier Interactive Patient Education  2017 [employer].

## 2023-06-07 NOTE — Progress Notes (Signed)
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 These were personally reviewed, remarkable for chronic ischemic disease, generalized moderate cerebral volume loss, no acute findings. There is some volume loss, but no atrophy is noted. He was last seen on 04/12/2023 with a [place] of 19/30.  He is not on antidementia medication at this time. Agree with patient and wife to hold initiating any memory meds unless indicated be Neuropsych. He reports that his memory is better after retiring recently, and taking Klonopin at night. He is able to participate in some ADLs, and continues to drive although it has been recommended prior that he  has a copilot and that this is being monitored closely.    Follow up in   months after neuropsych evaluation. Patient is scheduled for neuropsych on February 2025 evaluation for clarity of the diagnosis Recommend sleep studies for insomnia, history discussed with his PCP. Recommend good control of cardiovascular risk factors Continue to control mood as per PCP Recommend psychotherapy for anxiety Recommend monitoring driving carefully, recommend using a copilot and driving only short distances     Initial visit 04/12/2023 How long did patient have memory difficulties?  About 1 year ago, the patient reported having difficulty remembering recent conversations and people names and has become worse, provoking anxiety which is hindering him from performing other activities.  He is to be very good with numbers, and now he has significant difficulty.  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   Endorsed, not often  Disoriented when walking into a room?  Patient denies   Leaving objects in unusual places?  Misplaces things and cannot remember where they are such as the watch, keys, telephone, glasses, etc.  Wandering behavior?  denies   Any personality changes since last visit?  He becomes frustrated with his cognitive difficulties, and reports that his anxiety is worse  Any history of depression?:  Patient denies, he has never been seen by psychotherapist or psychiatrist. Hallucinations or paranoia?  Patient denies   Seizures?   Patient denies    Any sleep changes?  Insomnia is worse, now sleeping about 3 hours at night.  His PCP placed him on Klonopin, which has not been helping, and he is to discuss with his physician a new medication.  He also discussed with his PCP future sleep studies, has been entertaining this idea.    Occasionally  vivid dreams, REM behavior or sleepwalking."Sometimes I chase someone in my dreams and feel I am running " Sleep apnea?  Patient denies   Any hygiene concerns?  Patient denies   Independent of bathing and dressing?  Endorsed  Does the patient needs help with medications?  Patient is in charge   Who is in charge of the finances?  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    Any changes in appetite?  denies     Patient have trouble swallowing? denies   Does the patient cook?  No  Any kitchen accidents such  as leaving the stove on? denies   Any headaches?   History of migraines, on Imitrex for a long time, has not had any for about 3 years.  Chronic back pain ?denies   Ambulates with difficulty?  denies   Recent falls or head injuries? Last week he had a mechanical fall hitting his elbow, no head injury and no LOC Vision changes? denies   Unilateral weakness, numbness or tingling? denies   Any tremors?   denies    Any anosmia?  denies   Any incontinence of urine? Getting worse.  Any bowel dysfunction?  Chronic diarrhea  Patient lives  wife History of heavy alcohol intake? denies   History of heavy tobacco use? denies   Family history of dementia? MGM with dementia of unknown type and one uncle with PD dementia   Does patient drive?  "I get anxious driving "and gets hesitant where to turn, even in roads that has been before, decisions are "skewed.  ". Drives long distances at night  [employer]asters in professional counseling   CURRENT MEDICATIONS:  Outpatient Encounter Medications as of 06/07/2023  Medication Sig   aspirin 81 MG chewable tablet Chew 81 mg by mouth daily.   clonazePAM (KLONOPIN) 0.5 MG tablet Take 0.5 mg by mouth at bedtime.   fexofenadine (ALLEGRA) 180 MG tablet Take 180 mg by mouth daily before breakfast.   imipramine (TOFRANIL) 10 MG tablet Take 20 mg by mouth at bedtime.   metoprolol succinate (TOPROL-XL) 25 MG 24 hr tablet Take 25 mg by mouth at bedtime.   olmesartan (BENICAR) 40 MG tablet Take 40 mg by mouth daily.   omeprazole (PRILOSEC) 20 MG capsule Take 20-40 mg by mouth 2 (two) times daily before a meal. 40 mg every morning and 20 mg every evening   tamsulosin (FLOMAX) 0.4 MG CAPS capsule Take 0.4 mg by mouth at bedtime.   warfarin (COUMADIN) 3 MG tablet Take 3 mg by mouth daily at 6 PM.   No facility-administered encounter medications on file as of 06/07/2023.        No data to display            04/12/2023   10:10 AM  Montreal Cognitive Assessment   Visuospatial/ Executive (0/5) 1  Naming (0/3) 3  Attention: Read list of digits (0/2) 2  Attention: Read list of letters (0/1) 1  Attention: Serial 7 subtraction starting at 100 (0/3) 1  Language: Repeat phrase (0/2) 0  Language : Fluency (0/1) 0  Abstraction (0/2) 2  Delayed Recall (0/5) 4  Orientation (0/6) 5  Total 19  Adjusted Score (based on education) 19   Thank you for allowing [place] the opportunity to  participate in the care of this nice patient. Please do not hesitate to contact us for any questions or concerns.   Total time spent on today's visit was 27 minutes dedicated to this patient today, preparing to see patient, examining the patient, ordering tests and/or medications and counseling the patient, documenting clinical information in the EHR or other health record, independently interpreting results and communicating results to the patient/family, discussing treatment and goals, answering patient's questions and coordinating care.  Cc:  Richardean Chimera, MD  Marlowe Kays 06/07/2023 7:23 AM

## 2023-06-12 NOTE — Progress Notes (Signed)
06/14/23- 62 yoM never smoker for sleep evaluation courtesy of Dr Donzetta Sprung with concern of OSA- no hx of sleep study. Works in [employer]/ substance abuse. Medical problem list includes Insomnia, HTN, hx PE/ coumadin, Peripheral Vascular Disease, GERD, IBS, Kidney Stones, Memory Impairment, -clonazepam 1 mg HS,  Epworth score 3 Body weight today 214 lbs                            Here with wife. His primary concern is difficulty initiating and maintaining sleep.  Since childhood he has always been a [place] sleeper.  He emphasizes now that job-related stress is definitely significant and making this problem much worse.  I pointed out that I cannot help his job problems with medication and that part will have to be up to him to deal with.  We did discuss Cognitive Behavioral Therapy and he is familiar with that modality. He has been using clonazepam to help sleep over the last month or 2.  He falls asleep fairly easily now but wakes frequently and is up during the night, restless with busy brain. His wife describes snoring but does not note any complex parasomnias.  Occasional snoring on his back. 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 He denies heart or lung disease other than his history of PE and HTN, denies thyroid problems.  Prior to Admission medications   Medication Sig Start Date End Date Taking? Authorizing Provider  aspirin 81 MG chewable tablet Chew 81 mg by mouth daily.   Yes [provider]  clonazePAM (KLONOPIN) 0.5 MG tablet Take 0.5 mg by mouth at bedtime. 04/02/23  Yes [provider]  Eszopiclone 3 MG TABS Take 1 tablet (3 mg total) by mouth at bedtime. Take immediately before bedtime 06/14/23  Yes Ayn Domangue D, MD  fexofenadine (ALLEGRA) 180 MG tablet Take 180 mg by mouth daily before breakfast.   Yes [provider]  imipramine (TOFRANIL) 10 MG tablet Take 20 mg by  mouth at bedtime.   Yes [provider]  metoprolol succinate (TOPROL-XL) 25 MG 24 hr tablet Take 25 mg by mouth at bedtime.   Yes [provider]  olmesartan (BENICAR) 40 MG tablet Take 40 mg by mouth daily. 04/02/23  Yes [provider]  omeprazole (PRILOSEC) 20 MG capsule Take 20-40 mg by mouth 2 (two) times daily before a meal. 40 mg every morning and 20 mg every evening   Yes [provider]  tamsulosin (FLOMAX) 0.4 MG CAPS capsule Take 0.4 mg by mouth at bedtime.   Yes [provider]  warfarin (COUMADIN) 3 MG tablet Take 3 mg by mouth daily at 6 PM.   Yes [provider]   Past Medical History:  Diagnosis Date   Arthritis    both knees    GERD (gastroesophageal reflux disease)    H/O renal calculi    has been passed spontaneously but also had cystoscopy for one    Headache    migraines on occasion - used imitrex 5- 6 yrs. ago    History of kidney stones    cystoscopy x1, also has past spontaneously   Hypertension    Irritable bowel syndrome (IBS)    PE (pulmonary embolism) 1992   post op- 1 week   Peripheral vascular disease (HCC)    post op DVT-  approx. 2009, treated  at [facility]    PONV (postoperative nausea and vomiting)    Seasonal allergies    Past Surgical History:  Procedure Laterality Date   ANKLE SURGERY Right    repair of fracture & repair of torn tendons.   BIOPSY  11/02/2022   Procedure: BIOPSY;  Surgeon: Lanelle Bal, DO;  Location: [facility];  Service: Endoscopy;;   CARDIAC CATHETERIZATION     diagnostic only   CHOLECYSTECTOMY  08/2022   COLONOSCOPY WITH PROPOFOL N/A 11/02/2022   Procedure: COLONOSCOPY WITH PROPOFOL;  Surgeon: Lanelle Bal, DO;  Location: [facility];  Service: Endoscopy;  Laterality: N/A;  1:15pm, asa 3   KNEE SURGERY     8 arthroscopies & one repair of tendon- R    PERIPHERALLY INSERTED CENTRAL CATHETER INSERTION Right 2001   staph infection in R knee, post surgical,  treated with antibiotics     POLYPECTOMY  11/02/2022   Procedure: POLYPECTOMY;  Surgeon: Lanelle Bal, DO;  Location: [facility];  Service: Endoscopy;;   TONSILLECTOMY     TOTAL KNEE ARTHROPLASTY Left 03/05/2016   Procedure: LEFT TOTAL KNEE ARTHROPLASTY;  Surgeon: Jodi Geralds, MD;  Location: [facility] OR;  Service: Orthopedics;  Laterality: Left;   TOTAL KNEE ARTHROPLASTY Right 10/22/2016   TOTAL KNEE ARTHROPLASTY Right 10/22/2016   Procedure: TOTAL KNEE ARTHROPLASTY;  Surgeon: Jodi Geralds, MD;  Location: [facility] OR;  Service: Orthopedics;  Laterality: Right;   VOCAL CORD INJECTION  2002   bilateral medialization, implants on both vocal chords , done at [facility].    Family History  Problem Relation Age of Onset   Stroke Mother    Heart attack Mother    Heart Problems Mother    Hypertension Mother    Diabetes Mother    Allergies Mother    Heart attack Father    Stroke Father    Social History   Socioeconomic History   Marital status: Married    Spouse name: Not on file   Number of children: Not on file   Years of education: Not on file   Highest education level: Not on file  Occupational History   Not on file  Tobacco Use   Smoking status: Never   Smokeless tobacco: Never  Vaping Use   Vaping status: Never Used  Substance and Sexual Activity   Alcohol use: No   Drug use: No   Sexual activity: Not on file  Other Topics Concern   Not on file  Social History Narrative   Right Handed    Lives in a two story home    Social Determinants of Health   Financial Resource Strain: Not on file  Food Insecurity: Not on file  Transportation Needs: Not on file  Physical Activity: Not on file  Stress: Not on file  Social Connections: Not on file  Intimate Partner Violence: Not on file   ROS-see HPI   += positive Constitutional:    weight loss, night sweats, fevers, chills, fatigue, lassitude. HEENT:    headaches, difficulty swallowing, tooth/dental problems, sore throat,        sneezing, itching, ear ache, +nasal congestion, post nasal drip, snoring CV:    chest pain, orthopnea, PND, swelling in lower extremities, anasarca,                 dizziness, palpitations Resp:   shortness of breath with exertion or at rest.  productive cough,   non-productive cough, coughing up of blood.              change in color of mucus.  wheezing.   Skin:    rash or lesions. GI:  No-   heartburn, indigestion, abdominal pain, nausea, vomiting, diarrhea,                 change in bowel habits, loss of appetite GU: dysuria, change in color of urine, no urgency or frequency.   flank pain. MS:   joint pain, stiffness, decreased range of motion, back pain. Neuro-     nothing unusual Psych:  change in mood or affect.  +depression or +anxiety.   memory loss.  OBJ- Physical Exam General- Alert, Oriented, Affect-appropriate, Distress- none acute Skin- rash-none, lesions- none, excoriation- none Lymphadenopathy- none Head- atraumatic            Eyes- Gross vision intact, PERRLA, conjunctivae and secretions clear            Ears- Hearing, canals-normal            Nose- Clear, no-Septal dev, mucus, polyps, erosion, perforation             Throat- Mallampati II , mucosa clear , drainage- none, tonsils- atrophic, + dental repair, Neck- flexible , trachea midline, no stridor , thyroid nl, carotid no bruit Chest - symmetrical excursion , unlabored           Heart/CV- RRR , no murmur , no gallop  , no rub, nl s1 s2                           - JVD- none , edema- none, stasis changes- none, varices- none           Lung- clear to P&A, wheeze- none, cough- none , dullness-none, rub- none           Chest wall-  Abd-  Br/ Gen/ Rectal- Not done, not indicated Extrem- cyanosis- none, clubbing, none, atrophy- none, strength- nl Neuro- grossly intact to observation

## 2023-06-14 ENCOUNTER — Ambulatory Visit (INDEPENDENT_AMBULATORY_CARE_PROVIDER_SITE_OTHER): Payer: Managed Care, Other (non HMO) | Admitting: Internal Medicine

## 2023-06-14 ENCOUNTER — Encounter: Payer: Self-pay | Admitting: Internal Medicine

## 2023-06-14 ENCOUNTER — Telehealth: Payer: Self-pay | Admitting: Internal Medicine

## 2023-06-14 VITALS — BP 134/84 | HR 74 | Ht 72.0 in | Wt 214.0 lb

## 2023-06-14 DIAGNOSIS — R0683 Snoring: Secondary | ICD-10-CM

## 2023-06-14 DIAGNOSIS — G4700 Insomnia, unspecified: Secondary | ICD-10-CM

## 2023-06-14 DIAGNOSIS — I2699 Other pulmonary embolism without acute cor pulmonale: Secondary | ICD-10-CM | POA: Diagnosis not present

## 2023-06-14 MED ORDER — ESZOPICLONE 3 MG PO TABS: 3.0000 mg | ORAL_TABLET | Freq: Every day | ORAL | 2 refills | Status: DC

## 2023-06-14 NOTE — Assessment & Plan Note (Signed)
Primary concern seems to be insomnia and he describes lifetime pattern of being a [place] sleeper, easily disturbed.  He also is very clear that work stress is a significant part of the issue now.  He understands that he will have to be the one to fix that part of it.  We discussed CBT.  Clonazepam helps him fall asleep but does not help him maintain sleep as he hoped it would.  There is room to go up on the dose if needed but he is aware of the potential for dependence and withdrawal.  Instead for now we are going to let him try Lunesta as discussed.  Meanwhile we will order sleep study.

## 2023-06-14 NOTE — Assessment & Plan Note (Signed)
History of PE following knee surgery.  Note that he remains on chronic warfarin.

## 2023-06-14 NOTE — Patient Instructions (Signed)
Script sent to try Lunesta 3 mg, 1 at bedtime as needed for sleep  Order- schedule home sleep test  Please call us about 2 weeks after your sleep test for results and recommendations

## 2023-06-14 NOTE — Telephone Encounter (Signed)
Pharmacy calling need clarification on the drug that was sent in today  because it counter acts the [place] and wants to make sure DR. Young was ok with this please advise pharmacy

## 2023-06-18 NOTE — Telephone Encounter (Signed)
Spoke with patient's pharmacy regarding prior message. Per Dr.Young patient is going to try Eszopiclone 3mg  instead of Klonopin0.5mg . Pharmacy's voice was understanding. Nothing else further needed at this time.

## 2023-07-07 ENCOUNTER — Encounter: Payer: Self-pay | Admitting: Internal Medicine

## 2023-07-11 NOTE — Telephone Encounter (Signed)
[facility] states they have the order. I have left a message with Jeremy [name] with [facility] to check to see what the statis is

## 2023-07-11 NOTE — Telephone Encounter (Signed)
Jeremy [name] with [facility] called me back she stated they got the order on 06/25/23. They text the patient on 7/24, called the patient on 7/25, and text the patient again on 06/28/23.  Jeremy [name] stated she would call the patient right now from her number and try to get him verified

## 2023-09-06 DIAGNOSIS — G4733 Obstructive sleep apnea (adult) (pediatric): Secondary | ICD-10-CM

## 2023-09-06 DIAGNOSIS — R0683 Snoring: Secondary | ICD-10-CM

## 2023-09-15 NOTE — Progress Notes (Deleted)
06/14/23- 62 yoM never smoker for sleep evaluation courtesy of Dr Donzetta Sprung with concern of OSA- no hx of sleep study. Works in [employer]/ substance abuse. Medical problem list includes Insomnia, HTN, hx PE/ coumadin, Peripheral Vascular Disease, GERD, IBS, Kidney Stones, Memory Impairment, -clonazepam 1 mg HS,  Epworth score 3 Body weight today 214 lbs                            Here with wife. His primary concern is difficulty initiating and maintaining sleep.  Since childhood he has always been a [place] sleeper.  He emphasizes now that job-related stress is definitely significant and making this problem much worse.  I pointed out that I cannot help his job problems with medication and that part will have to be up to him to deal with.  We did discuss Cognitive Behavioral Therapy and he is familiar with that modality. He has been using clonazepam to help sleep over the last month or 2.  He falls asleep fairly easily now but wakes frequently and is up during the night, restless with busy brain. His wife describes snoring but does not note any complex parasomnias.  Occasional snoring on his back. 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 He denies heart or lung disease other than his history of PE and HTN, denies thyroid problems.  09/17/23- 62 yoM never smoker followed for OSA, Insomnia. Works in [employer]/ substance abuse. Medical problem list includes Insomnia, HTN, hx PE/ coumadin, Peripheral Vascular Disease, GERD, IBS, Kidney Stones, Memory Impairment, - Lunesta 3 mg HST 08/20/23- hi 7.4/hr, desaturation to 84%, body weight 209 lbs For treatment decision  ROS-see HPI   += positive Constitutional:    weight loss, night sweats, fevers, chills, fatigue, lassitude. HEENT:    headaches, difficulty swallowing, tooth/dental problems, sore throat,       sneezing, itching, ear ache, +nasal congestion, post nasal  drip, snoring CV:    chest pain, orthopnea, PND, swelling in lower extremities, anasarca,                 dizziness, palpitations Resp:   shortness of breath with exertion or at rest.                productive cough,   non-productive cough, coughing up of blood.              change in color of mucus.  wheezing.   Skin:    rash or lesions. GI:  No-   heartburn, indigestion, abdominal pain, nausea, vomiting, diarrhea,                 change in bowel habits, loss of appetite GU: dysuria, change in color of urine, no urgency or frequency.   flank pain. MS:   joint pain, stiffness, decreased range of motion, back pain. Neuro-     nothing unusual Psych:  change in mood or affect.  +depression or +anxiety.   memory loss.  OBJ- Physical Exam General- Alert, Oriented, Affect-appropriate, Distress- none acute Skin- rash-none, lesions- none, excoriation- none Lymphadenopathy- none Head- atraumatic            Eyes- Gross vision intact, PERRLA, conjunctivae and secretions clear            Ears- Hearing, canals-normal            Nose- Clear,  no-Septal dev, mucus, polyps, erosion, perforation             Throat- Mallampati II , mucosa clear , drainage- none, tonsils- atrophic, + dental repair, Neck- flexible , trachea midline, no stridor , thyroid nl, carotid no bruit Chest - symmetrical excursion , unlabored           Heart/CV- RRR , no murmur , no gallop  , no rub, nl s1 s2                           - JVD- none , edema- none, stasis changes- none, varices- none           Lung- clear to P&A, wheeze- none, cough- none , dullness-none, rub- none           Chest wall-  Abd-  Br/ Gen/ Rectal- Not done, not indicated Extrem- cyanosis- none, clubbing, none, atrophy- none, strength- nl Neuro- grossly intact to observation

## 2023-09-17 ENCOUNTER — Other Ambulatory Visit: Payer: Self-pay | Admitting: Internal Medicine

## 2023-09-17 ENCOUNTER — Ambulatory Visit: Payer: Managed Care, Other (non HMO) | Admitting: Internal Medicine

## 2023-09-17 NOTE — Telephone Encounter (Signed)
Eszopiclone (lunesta) refilled

## 2023-11-05 ENCOUNTER — Encounter: Payer: Self-pay | Admitting: Psychology

## 2023-11-05 DIAGNOSIS — K219 Gastro-esophageal reflux disease without esophagitis: Secondary | ICD-10-CM | POA: Insufficient documentation

## 2023-11-05 DIAGNOSIS — I10 Essential (primary) hypertension: Secondary | ICD-10-CM | POA: Insufficient documentation

## 2023-11-05 DIAGNOSIS — K589 Irritable bowel syndrome without diarrhea: Secondary | ICD-10-CM | POA: Insufficient documentation

## 2023-11-06 ENCOUNTER — Ambulatory Visit: Payer: Self-pay

## 2023-11-06 ENCOUNTER — Ambulatory Visit: Payer: Managed Care, Other (non HMO) | Admitting: Psychology

## 2023-11-06 ENCOUNTER — Encounter: Payer: Self-pay | Admitting: Psychology

## 2023-11-06 DIAGNOSIS — F411 Generalized anxiety disorder: Secondary | ICD-10-CM

## 2023-11-06 DIAGNOSIS — G3184 Mild cognitive impairment, so stated: Secondary | ICD-10-CM

## 2023-11-06 DIAGNOSIS — R4189 Other symptoms and signs involving cognitive functions and awareness: Secondary | ICD-10-CM

## 2023-11-06 NOTE — Progress Notes (Signed)
   Psychometrician Note   Cognitive testing was administered to Jeremy [name] by Wallace Keller, B.S. (psychometrist) under the supervision of Dr. Newman Nickels, Ph.D., licensed psychologist on 11/06/2023. Jeremy [name] did not appear overtly distressed by the testing session per behavioral observation or responses across self-report questionnaires. Rest breaks were offered.    The battery of tests administered was selected by Dr. Newman Nickels, Ph.D. with consideration to Jeremy [name] current level of functioning, the nature of his symptoms, emotional and behavioral responses during interview, level of literacy, observed level of motivation/effort, and the nature of the referral question. This battery was communicated to the psychometrist. Communication between Dr. Newman Nickels, Ph.D. and the psychometrist was ongoing throughout the evaluation and Dr. Newman Nickels, Ph.D. was immediately accessible at all times. Dr. Newman Nickels, Ph.D. provided supervision to the psychometrist on the date of this service to the extent necessary to assure the quality of all services provided.    Jeremy [name] will return within approximately 1-2 weeks for an interactive feedback session with Jeremy [name] at which time his test performances, clinical impressions, and treatment recommendations will be reviewed in detail. Jeremy [name] understands he can contact our office should he require our assistance before this time.  A total of 155 minutes of billable time were spent face-to-face with Jeremy [name] by the psychometrist. This includes both test administration and scoring time. Billing for these services is reflected in the clinical report generated by Dr. Newman Nickels, Ph.D.  This note reflects time spent with the psychometrician and does not include test scores or any clinical interpretations made by Jeremy [name]. The full report will follow in a separate note.

## 2023-11-06 NOTE — Progress Notes (Addendum)
NEUROPSYCHOLOGICAL EVALUATION [facility] Department of Neurology  Date of Evaluation: November 06, 2023  Reason for Referral:   KARTIK SARGSYAN is a 62 y.o. right-handed Caucasian male referred by Marlowe Kays, PA-C, to characterize his current cognitive functioning and assist with diagnostic clarity and treatment planning in the context of subjective cognitive decline.   Assessment and Plan:   Clinical Impression(s): Mr. Goetzinger pattern of performance is suggestive of severe impairment surrounding executive functioning, visuospatial abilities, and all aspects of learning and memory. Additional impairments were exhibited across processing speed and complex attention. Performances were appropriate relative to age-matched peers across basic attention, verbal reasoning, safety/judgment, and both receptive and expressive language. His degree of impairment would typically warrant a formal dementia diagnosis. However, functionally, he largely denied concerns outside of him taking a leave of absence from his work due to memory concerns. His wife was in agreement. 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 However, he would certainly be towards the severe end of this spectrum and at risk for a formal transition to a dementia presentation in the years to come.   The cause for ongoing impairment is unclear at the present time. I cannot rule out an early-onset Alzheimer's disease presentation. Retention rates across memory testing ranged from 0% to 60% and he generally performed very poorly across yes/no recognition trials. This would suggest concerns for rapid forgetting and an evolving storage impairment, both of which represent the hallmark testing characteristic of this illness. Despite this, his degree of executive and visuospatial impairment is quite advanced relative to a  classic Alzheimer's disease presentation in earlier stages. The degree of intact expressive language abilities would also be unexpected in a typically presenting Alzheimer's disease presentation. This could elevate the risk for an atypical presentation such as the frontal variant or posterior cortical atrophy. Further analysis (see recommendations below) will be necessary to achieve greater diagnostic clarity with regard to Alzheimer's disease concerns.  Advanced executive and visuospatial impairment is common in Lewy body disease. Memory can also be impacted in this condition and it can co-occur with Alzheimer's disease somewhat frequently. While this cannot be ruled out, Mr. Dolinski denied any parkinsonian behaviors, REM sleep behaviors, or fully-formed visual hallucinations. Given the lack of behavioral features, this would make this presentation less likely based upon currently available evidence. Advanced executive and visuospatial impairment can also be seen in corticobasal degeneration. However, again, he does not display prominent parkinsonian features or alien-limb experiences and his recent MRI was generalized with regard to advanced volume loss, making this presentation less likely.  Despite advanced executive dysfunction, Mr. Kehoe wife did not describe severe personality changes commonly seen in the behavioral variant of frontotemporal lobar degeneration. Language testing was a relative strength across the current evaluation, which is not suggestive of the language variant of this presentation. His MRI did not suggest advanced vascular disease or a prior infarct, making a vascular cause very unlikely.  Recent neuroimaging revealed ventriculomegaly. While the neuroradiologist unfortunately did not specify the cause, this was assumed to be ex vacuo due to volume loss rather than related to hydrocephalus. This could warrant further examination/confirmation. Mr. Fonger does not display common features  of normal pressure hydrocephalus as he denied prominent balance instability or incontinence.   Mr. Mazzoli did express anxiety throughout the evaluation and reported mild to moderate psychiatric distress across mood-related questionnaires. While psychiatric distress can certainly worsen  cognitive functioning, his degree of impairment is above and beyond what would be reasonably expected in my opinion. I do have fears surrounding the presence of a currently unspecified neurodegenerative illness. Continued medical monitoring will be important moving forward.   Recommendations: Mr. Gochanour should discuss additional forms of testing with Ms. Wertman in order to achieve greater diagnostic clarity. Specifically, I would recommend the completion of an FDG-PET scan and/or lumbar puncture as these tests can provide valuable information surrounding neurodegenerative illness.   Mr. Kiesling should discuss medication aimed to address memory loss and concerns surrounding a neurodegenerative illness with Ms. Wertman. It is important to highlight that this medication has been shown to slow functional decline in some individuals. There is no current treatment which can stop or reverse cognitive decline when caused by a neurodegenerative illness.   Untreated sleep apnea will increase Mr. Hickam risk for future stroke, heart attack, and progression to a dementia presentation. It will also negatively impact memory and other thinking abilities to a likely mild extent on a day-to-day basis. I would encourage him to at least try CPAP/BiPAP treatment rather than present with opposition based on other individual's reported experiences.   A repeat neuropsychological evaluation in 12-24 months is recommended to assess the trajectory of future cognitive decline should it occur. This will also aid in future efforts towards improved diagnostic clarity.  Mr. Printy is encouraged to speak with his prescribing physician regarding medication  adjustments to optimally manage psychiatric distress. It should be highlighted that clonazepam/Klonopin has well established cognitive side effects which can worsen abilities including processing speed, attention, and  memory. I would recommend that he discuss alternate medications with his prescribing physician.  Performance across neurocognitive testing is not a strong predictor of an individual's safety operating a motor vehicle. With that being said, I do have prominent safety concerns given deficits across executive functioning and visuospatial abilities across testing. I would recommend that he abstain from driving pending the completion of a formal driving evaluation. Should his family wish to pursue a formalized driving evaluation, they could reach out to the following agencies: [employer] in [place]: [phone] [employer] [facility]: [phone] [facility]: [phone] [facility]: [phone] or [phone]  Should there be progression of current deficits over time, Mr. Winkelman is unlikely to regain any independent living skills lost. Therefore, it is recommended that he remain as involved as possible in all aspects of household chores, finances, and medication management, with supervision to ensure adequate performance. He will likely benefit from the establishment and maintenance of a routine in order to maximize his functional abilities over time.  It will be important for Mr. Misner to have another person with him when in situations where he may need to process information, weigh the pros and cons of different options, and make decisions, in order to ensure that he fully understands and recalls all information to be considered.  If not already done, Mr. Creasman and his family may want to discuss his wishes regarding durable power of attorney and medical decision making, so that he can have input into these choices. If they require legal assistance  with this, long-term care resource access, or other aspects of estate planning, they could reach out to [employer] at [phone] for a free consultation.   Mr. Tabora is encouraged to attend to lifestyle factors for brain health (e.g., regular physical exercise, good nutrition habits and consideration of the MIND-DASH diet, regular participation in cognitively-stimulating activities, and general  stress management techniques), which are likely to have benefits for both emotional adjustment and cognition. In fact, in addition to promoting good general health, regular exercise incorporating aerobic activities (e.g., brisk walking, jogging, cycling, etc.) has been demonstrated to be a very effective treatment for depression and stress, with similar efficacy rates to both antidepressant medication and psychotherapy. Optimal control of vascular risk factors (including safe cardiovascular exercise and adherence to dietary recommendations) is encouraged. Continued participation in activities which provide mental stimulation and social interaction is also recommended.   Important information should be provided to Mr. Todorovich in written format in all instances. This information should be placed in a highly frequented and easily visible location within his home to promote recall. External strategies such as written notes in a consistently used memory journal, visual and nonverbal auditory cues such as a calendar on the refrigerator or appointments with alarm, such as on a cell phone, can also help maximize recall.  Memory can be improved using internal strategies such as rehearsal, repetition, chunking, mnemonics, association, and imagery. External strategies such as written notes in a consistently used memory journal, visual and nonverbal auditory cues such as a calendar on the refrigerator or appointments with alarm, such as on a cell phone, can also help maximize recall.    When learning new information, he  would benefit from information being broken up into small, manageable pieces. he may also find it helpful to articulate the material in his own words and in a context to promote encoding at the onset of a new task. This material may need to be repeated multiple times to promote encoding.  To address problems with processing speed, he may wish to consider:   -Ensuring that he is alerted when essential material or instructions are being presented   -Adjusting the speed at which new information is presented   -Allowing for more time in comprehending, processing, and responding in conversation   -Repeating and paraphrasing instructions or conversations aloud  To address problems with fluctuating attention and/or executive dysfunction, he may wish to consider:   -Avoiding external distractions when needing to concentrate   -Limiting exposure to fast paced environments with multiple sensory demands   -Writing down complicated information and using checklists   -Attempting and completing one task at a time (i.e., no multi-tasking)   -Verbalizing aloud each step of a task to maintain focus   -Taking frequent breaks during the completion of steps/tasks to avoid fatigue   -Reducing the amount of information considered at one time   -Scheduling more difficult activities for a time of day where he is usually most alert  Review of Records:   Mr. Freudenthal was seen by [facility] Marlowe Kays, PA-C) on 04/12/2023 for an evaluation of memory loss. At that time, difficulties were said to be present for the past year or so. Primary examples included trouble recalling names and details of recent conversations. Difficulties were said to invoke anxiety which can become severe and further worsen cognition. His wife added some repetition in day-to-day conversation. ADL dysfunction was denied. Performance on a brief cognitive screening instrument (MOCA) was 19/30. Ultimately, Mr. Kitts was referred for a  comprehensive neuropsychological evaluation to characterize his cognitive abilities and to assist with diagnostic clarity and treatment planning.   Neuroimaging Brain MRI on 06/02/2023 suggested moderate cerebral volume loss with ventriculomegaly and mild microvascular ischemic disease.   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 ago    History of kidney stones    cystoscopy x1, also has past spontaneously   Hypertension    Insomnia 08/29/2022   Irritable bowel syndrome (IBS)    Major depressive disorder 10/03/2022   Peripheral vascular disease 2009   post op; treated at [facility]   PONV (postoperative nausea and vomiting)    Primary osteoarthritis of right knee 03/05/2016   Pulmonary embolism 1992   post op- 1 week     Seasonal allergies     Past Surgical History:  Procedure Laterality Date   ANKLE SURGERY Right    repair of fracture & repair of torn tendons.   BIOPSY  11/02/2022   Procedure: BIOPSY;  Surgeon: Lanelle Bal, DO;  Location: [facility];  Service: Endoscopy;;   CARDIAC CATHETERIZATION     diagnostic only   CHOLECYSTECTOMY  08/2022   COLONOSCOPY WITH PROPOFOL N/A 11/02/2022   Procedure: COLONOSCOPY WITH PROPOFOL;  Surgeon: Lanelle Bal, DO;  Location: [facility];  Service: Endoscopy;  Laterality: N/A;  1:15pm, asa 3   KNEE SURGERY     8 arthroscopies & one repair of tendon- R    PERIPHERALLY INSERTED CENTRAL CATHETER INSERTION Right 2001   staph infection in R knee, post surgical, treated with antibiotics     POLYPECTOMY  11/02/2022   Procedure: POLYPECTOMY;  Surgeon: Lanelle Bal, DO;  Location: [facility];  Service: Endoscopy;;   TONSILLECTOMY     TOTAL KNEE ARTHROPLASTY Left 03/05/2016   Procedure: LEFT TOTAL KNEE ARTHROPLASTY;   Surgeon: Jodi Geralds, MD;  Location: [facility] OR;  Service: Orthopedics;  Laterality: Left;   TOTAL KNEE ARTHROPLASTY Right 10/22/2016   TOTAL KNEE ARTHROPLASTY Right 10/22/2016   Procedure: TOTAL KNEE ARTHROPLASTY;  Surgeon: Jodi Geralds, MD;  Location: [facility] OR;  Service: Orthopedics;  Laterality: Right;   VOCAL CORD INJECTION  2002   bilateral medialization, implants on both vocal chords , done at [facility].     Current Outpatient Medications:    aspirin 81 MG chewable tablet, Chew 81 mg by mouth daily., Disp: , Rfl:    clonazePAM (KLONOPIN) 0.5 MG tablet, Take 0.5 mg by mouth at bedtime., Disp: , Rfl:    Eszopiclone 3 MG TABS, TAKE 1 TABLET BY MOUTH immediately BEFORE bedtime, Disp: 30 tablet, Rfl: 5   fexofenadine (ALLEGRA) 180 MG tablet, Take 180 mg by mouth daily before breakfast., Disp: , Rfl:    imipramine (TOFRANIL) 10 MG tablet, Take 20 mg by mouth at bedtime., Disp: , Rfl:    metoprolol succinate (TOPROL-XL) 25 MG 24 hr tablet, Take 25 mg by mouth at bedtime., Disp: , Rfl:    olmesartan (BENICAR) 40 MG tablet, Take 40 mg by mouth daily., Disp: , Rfl:    omeprazole (PRILOSEC) 20 MG capsule, Take 20-40 mg by mouth 2 (two) times daily before a meal. 40 mg every morning and 20 mg every evening, Disp: , Rfl:    tamsulosin (FLOMAX) 0.4 MG CAPS capsule, Take 0.4 mg by mouth at bedtime., Disp: , Rfl:    warfarin (COUMADIN) 3 MG tablet, Take 3 mg by mouth daily at 6 PM., Disp: , Rfl:   Clinical Interview:   The following information was obtained during a clinical interview with Mr. Silvestre and his wife prior to cognitive testing.  Cognitive Symptoms: Decreased short-term memory: Endorsed. Mr. Ty described generalized forgetfulness. With direct questioning, he described trouble recalling names and details of recent conversations. His wife noted some repetition in day-to-day conversation, as well as trouble remembering multiple instructions at a single time. Difficulties were said to be present  for at least the past year and do seem to have progressively worsened over time.  Decreased long-term memory: Denied. Decreased attention/concentration: Endorsed. He reported trouble with sustained attention and increased distractibility.  Reduced processing speed: Denied. Rather, he described his mind as "never stops." Difficulties with executive functions: Endorsed. He specifically reported trouble with organization, multi-tasking, and thinking through more complex, multi-step projects. He denied trouble with impulsivity and his wife did not report any significant personality changes outside of him being a little more easily agitated and sensitive/defensive.  Difficulties with emotion regulation: Denied. Difficulties with receptive language: Denied. Difficulties with word finding: Denied. Decreased visuoperceptual ability: Denied.  Difficulties completing ADLs: Denied. He has greatly diminished his overall driving. This was reportedly due to a sharp increase in experienced anxiety whenever he gets on busier roads.   Additional Medical History: History of traumatic brain injury/concussion: Endorsed. He reported being "knocked out" on several occasions throughout his life. His most recent occurrence was said to have occurred at least five years prior where he was struck in his head by the head of one of his horses which was startled. He was unsure if he experienced a complete loss in consciousness but was temporarily dazed and disoriented. He denied any persisting cognitive difficulties from this event or any other more remote head injury.  History of stroke: Denied. History of seizure activity: Denied. History of known exposure to toxins: Denied. Symptoms of chronic pain: Denied. Experience of frequent headaches/migraines: Denied. He did report experiencing migraine headaches about once per month. This does represent an increase in frequently lately as symptoms had been occurring 1-2 times per  year previously.  Frequent instances of dizziness/vertigo: Denied.  Sensory changes: He wears glasses with benefit. Other sensory changes/difficulties (e.g., hearing, taste, smell) were denied.  Balance/coordination difficulties: Denied. Other motor difficulties: Denied.  Sleep History: Estimated hours obtained each night: 4-6 hours. Difficulties falling asleep: Endorsed. Difficulties staying asleep: Endorsed. He described his sleep as quite broken in nature and that the sleep he is able to obtain is not consistent.  Feels rested and refreshed upon awakening: Endorsed.  History of snoring: Endorsed. History of waking up gasping for air: Endorsed. Witnessed breath cessation while asleep: Endorsed. He reported recently completing a sleep study which reportedly suggested mild to moderate obstructive sleep apnea and the recommendation that he initiate BiPAP treatment. I was unable to locate these records in his chart. He expressed a fairly steadfast opposition to CPAP/BiPAP intervention. As such, this condition remains untreated.   History of vivid dreaming: Endorsed. Excessive movement while asleep: Denied outside of some restless leg movements.  Instances of acting out his dreams: Denied.  Psychiatric/Behavioral Health History: Depression: He acknowledged a history of depressive experiences throughout his life, as well as current symptoms. These were generally mild in nature. Current or remote suicidal ideation, intent, or plan was denied.  Anxiety: He described situational anxiety, especially while driving. His wife described him experiencing more longstanding and generalized anxious distress, often coinciding with depressive symptoms.  Mania: Denied. Trauma History: Denied. Visual/auditory hallucinations: Denied. Delusional thoughts: Denied.  Tobacco: Denied. Alcohol: He denied current alcohol consumption as well as a history of problematic alcohol abuse or dependence.  Recreational  drugs: Denied.  Family History: Problem Relation Age of Onset   Stroke Mother    Heart attack Mother    Heart Problems Mother    Hypertension Mother    Diabetes Mother    Allergies Mother    Heart attack Father    Stroke Father    Dementia Maternal Grandmother    ALS Other    This information was confirmed by Mr. Devisser.  Academic/Vocational History: Highest level of educational attainment: 18 years. He earned a Education administrator degree in counseling and described himself as a Designer, multimedia in academic settings. No relative weaknesses were described.  History of developmental delay: Denied. History of grade repetition: Denied. Enrollment in special education courses: Denied. History of LD/ADHD: Denied.  Employment: He has been on a leave of absence since this past May due to memory concerns. 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   Evaluation Results:   Behavioral Observations: Mr. Wolinsky was accompanied by his wife, arrived to his appointment on time, and was appropriately dressed and groomed. He appeared alert. Observed gait and station were within normal limits. Gross motor functioning appeared intact upon informal observation and no abnormal movements (e.g., tremors) were noted. His affect was generally relaxed and positive. Spontaneous speech was fluent and word finding difficulties were not observed during the clinical interview. Thought processes were coherent, organized, and normal in content. Insight into his cognitive difficulties appeared adequate.   During testing, he would commonly lose his place across tasks and have no realization until it was pointed out by the psychometrist. There were also instances where he appeared to spontaneously forget task instructions in the middle of attempting said task. He exhibited confusion and was ultimately unable to comprehend more complex tasks (TMT B,  D-KEFS Color Word). Anxiety was present and did seem to temporarily escalate whenever he was aware of errors being made. Sustained attention was appropriate. Task engagement was adequate and he persisted when challenged. Overall, Mr. Starratt was cooperative with the clinical interview and subsequent testing procedures.   Adequacy of Effort: The validity of neuropsychological testing is limited by the extent to which the individual being tested may be assumed to have exerted adequate effort during testing. Mr. Mousel expressed his intention to perform to the best of his abilities and exhibited adequate task engagement and persistence. Scores across stand-alone and embedded performance validity measures were within expectation. As such, the results of the current evaluation are believed to be a valid representation of Mr. Bivins current cognitive functioning.  Test Results: Mr. Elzinga was oriented at the time of the current evaluation.  Intellectual abilities based upon educational and vocational attainment were estimated to be in the average to above average range. Premorbid abilities were estimated to be within the average range based upon a single-word reading test.   Processing speed was exceptionally low to below average. Basic attention was average. More complex attention (e.g., working memory) was well below average. Executive functioning was exceptionally low to well below average outside of an average performance across a verbal reasoning task. He performed in the average range across a task assessing safety and judgment.  Assessed receptive language abilities were average. Likewise, Mr. Sender answered all questions asked of him appropriately during conversation. Assessed expressive language (e.g., verbal fluency and confrontation naming) was below average to average.     Assessed visuospatial/visuoconstructional abilities were exceptionally low. Across his drawing of a clock,  numbers were largely  placed outside of the outer circle and exhibited some mild placement irregularity. He was also unable to comprehend drawing the clock hands, ultimately only placing a single hand in an incorrect location. Across his drawing of a complex figure, points were lost due to some notable visual distortions and many prominent internal aspects being either fully omitted or drawn beyond recognition.     Learning (i.e., encoding) of novel verbal and visual information was exceptionally low to well below average. Spontaneous delayed recall (i.e., retrieval) of previously learned information was exceptionally low. Retention rates were 0% across a story learning task, 60% across a list learning task, 31% across a daily living task, and 50% across a shape learning task. Performance across recognition tasks was average across a list learning task but exceptionally low to well below average across all other tasks, suggesting limited evidence for information consolidation.   Results of emotional screening instruments suggested that recent symptoms of generalized anxiety were in the moderate range, while symptoms of depression were within the mild range. A screening instrument assessing recent sleep quality suggested the presence of minimal sleep dysfunction.  Tables of Scores:   Note: This summary of test scores accompanies the interpretive report and should not be considered in isolation without reference to the appropriate sections in the text. Descriptors are based on appropriate normative data and may be adjusted based on clinical judgment. Terms such as "Within Normal Limits" and "Outside Normal Limits" are used when a more specific description of the test score cannot be determined.       Percentile - Normative Descriptor > 98 - Exceptionally High 91-97 - Well Above Average 75-90 - Above Average 25-74 - Average 9-24 - Below Average 2-8 - Well Below Average < 2 - Exceptionally Low       Validity:   DESCRIPTOR        ACS WC: --- --- Within Normal Limits  DCT: --- --- Within Normal Limits  NAB EVI: --- --- Within Normal Limits       Orientation:      Raw Score Percentile   NAB Orientation, Form 1 28/29 --- ---       Cognitive Screening:      Raw Score Percentile   SLUMS: 20/30 --- ---       Intellectual Functioning:      Standard Score Percentile   Test of Premorbid Functioning: 100 50 Average       Memory:     NAB Memory Module, Form 1: Standard Score/ T Score Percentile   Total Memory Index 59 <1 Exceptionally Low  List Learning       Total Trials 1-3 17/36 (36) 8 Well Below Average    List B 1/12 (24) <1 Exceptionally Low    Short Delay Free Recall 5/12 (37) 9 Below Average    Long Delay Free Recall 3/12 (27) 1 Exceptionally Low    Retention Percentage 60 (35) 7 Well Below Average    Recognition Discriminability 9 (54) 66 Average  Shape Learning       Total Trials 1-3 7/27 (22) <1 Exceptionally Low    Delayed Recall 2/9 (21) <1 Exceptionally Low    Retention Percentage 50 (33) 5 Well Below Average    Recognition Discriminability 3 (32) 4 Well Below Average  Story Learning       Immediate Recall 31/80 (25) 1 Exceptionally Low    Delayed Recall 0/40 (27) 1 Exceptionally Low    Retention Percentage 0 (<  19) <1 Exceptionally Low  Daily Living Memory       Immediate Recall 32/51 (32) 4 Well Below Average    Delayed Recall 4/17 (19) <1 Exceptionally Low    Retention Percentage 31 (<19) <1 Exceptionally Low    Recognition Hits 5/10 (<19) <1 Exceptionally Low       Attention/Executive Function:     Trail Making Test (TMT): Raw Score (T Score) Percentile     Part A 116 secs.,  3 errors (<19) <1 Exceptionally Low    Part B Discontinued --- Impaired        NAB Attention Module, Form 1: T Score Percentile     Digits Forward 48 42 Average    Digits Backwards 32 4 Well Below Average        Scaled Score Percentile   WAIS-IV Similarities: 8 25 Average       D-KEFS Color-Word  Interference Test: Raw Score (Scaled Score) Percentile     Color Naming 56 secs. (1) <1 Exceptionally Low    Word Reading 30 secs. (7) 16 Below Average    Inhibition Discontinued --- Impaired    Inhibition/Switching Not Attempted --- ---       D-KEFS Verbal Fluency Test: Raw Score (Scaled Score) Percentile     Letter Total Correct 32 (9) 37 Average    Category Total Correct 24 (6) 9 Below Average    Category Switching Total Correct 7 (3) 1 Exceptionally Low    Category Switching Accuracy 5 (4) 2 Well Below Average      Total Set Loss Errors 2 (10) 50 Average      Total Repetition Errors 1 (12) 75 Above Average       NAB Executive Functions Module, Form 1: T Score Percentile     Judgment 51 54 Average       Language:     Verbal Fluency Test: Raw Score (T Score) Percentile     Phonemic Fluency (FAS) 32 (38) 12 Below Average    Animal Fluency 16 (38) 12 Below Average        NAB Language Module, Form 1: T Score Percentile     Auditory Comprehension 51 54 Average    Naming 30/31 (53) 62 Average       Visuospatial/Visuoconstruction:      Raw Score Percentile   Clock Drawing: 5/10 --- Impaired       NAB Spatial Module, Form 1: T Score Percentile     Figure Drawing Copy 24 <1 Exceptionally Low        Scaled Score Percentile   WAIS-IV Block Design: 2 <1 Exceptionally Low       Mood and Personality:      Raw Score Percentile   PROMIS Depression Questionnaire: 21 --- Mild  PROMIS Anxiety Questionnaire: 23 --- Moderate       Additional Questionnaires:      Raw Score Percentile   PROMIS Sleep Disturbance Questionnaire: 23 --- None to Slight   Informed Consent and Coding/Compliance:   The current evaluation represents a clinical evaluation for the purposes previously outlined by the referral source and is in no way reflective of a forensic evaluation.   Mr. Lenn was provided with a verbal description of the nature and purpose of the present neuropsychological evaluation. Also  reviewed were the foreseeable risks and/or discomforts and benefits of the procedure, limits of confidentiality, and mandatory reporting requirements of this provider. The patient was given the opportunity to ask questions and receive answers about  the evaluation. Oral consent to participate was provided by the patient.   This evaluation was conducted by Newman Nickels, Ph.D., ABPP-CN, board certified clinical neuropsychologist. Mr. Manza completed a clinical interview with Dr. Milbert Coulter, billed as one unit [phone], and 155 minutes of cognitive testing and scoring, billed as one unit [phone] and four additional units [id number]. Psychometrist Wallace Keller, B.S. assisted Dr. Milbert Coulter with test administration and scoring procedures. As a separate and discrete service, one unit [id number] and three units [phone] were billed for Dr. Tammy Sours time spent in interpretation, case consultation, and report writing.

## 2023-11-07 ENCOUNTER — Encounter: Payer: Self-pay | Admitting: Psychology

## 2023-11-07 DIAGNOSIS — G3184 Mild cognitive impairment, so stated: Secondary | ICD-10-CM

## 2023-11-07 HISTORY — DX: Mild cognitive impairment of uncertain or unknown etiology: G31.84

## 2023-11-13 ENCOUNTER — Encounter: Payer: Self-pay | Admitting: Physician Assistant

## 2023-11-13 ENCOUNTER — Ambulatory Visit: Payer: Managed Care, Other (non HMO) | Admitting: Psychology

## 2023-11-13 DIAGNOSIS — R413 Other amnesia: Secondary | ICD-10-CM

## 2023-11-13 DIAGNOSIS — G3184 Mild cognitive impairment, so stated: Secondary | ICD-10-CM

## 2023-11-13 NOTE — Progress Notes (Signed)
   Neuropsychology Feedback Session Jeremy [name]. [facility]  Department of Neurology  Reason for Referral:   Jeremy [name] is a 62 y.o. right-handed Caucasian male referred by Marlowe Kays, PA-C, to characterize his current cognitive functioning and assist with diagnostic clarity and treatment planning in the context of subjective cognitive decline.   Feedback:   Jeremy [name] completed a comprehensive neuropsychological evaluation on 12/06/2022. Please refer to that encounter for the full report and recommendations. Briefly, results suggested severe impairment surrounding executive functioning, visuospatial abilities, and all aspects of learning and memory. Additional impairments were exhibited across processing speed and complex attention. The cause for ongoing impairment is unclear at the present time. I cannot rule out an early-onset Alzheimer's disease presentation. Retention rates across memory testing ranged from 0% to 60% and he generally performed very poorly across yes/no recognition trials. This would suggest concerns for rapid forgetting and an evolving storage impairment, both of which represent the hallmark testing characteristic of this illness. Despite this, his degree of executive and visuospatial impairment is quite advanced relative to a classic Alzheimer's disease presentation in earlier stages. The degree of intact expressive language abilities would also be unexpected in a typically presenting Alzheimer's disease presentation. This could elevate the risk for an atypical presentation such as the frontal variant or posterior cortical atrophy. Further analysis will be necessary to achieve greater diagnostic clarity with regard to Alzheimer's disease concerns.   Jeremy [name] was accompanied by his wife during the current feedback session. Content of the current session focused on the results of his neuropsychological evaluation. Jeremy [name] was given the opportunity to ask  questions and his questions were answered. He was encouraged to reach out should additional questions arise. A copy of his report was provided at the conclusion of the visit.      One unit [phone] was billed for Dr. Tammy Sours time spent preparing for, conducting, and documenting the current feedback session with Jeremy [name].

## 2023-11-28 ENCOUNTER — Encounter (HOSPITAL_COMMUNITY)
Admission: RE | Admit: 2023-11-28 | Discharge: 2023-11-28 | Disposition: A | Discharge status: Home / Self Care | Payer: Managed Care, Other (non HMO) | Source: Ambulatory Visit | Attending: Physician Assistant | Admitting: Physician Assistant

## 2023-11-28 DIAGNOSIS — R413 Other amnesia: Secondary | ICD-10-CM | POA: Diagnosis present

## 2023-11-28 LAB — GLUCOSE, CAPILLARY: Glucose-Capillary: 101 mg/dL — ABNORMAL HIGH (ref 70–99)

## 2023-11-28 MED ORDER — FLUDEOXYGLUCOSE F - 18 (FDG) INJECTION
10.0000 | Freq: Once | INTRAVENOUS | Status: AC | PRN
  Administered 2023-11-28: 9.9 via INTRAVENOUS

## 2023-12-13 ENCOUNTER — Ambulatory Visit: Payer: Managed Care, Other (non HMO) | Admitting: Physician Assistant

## 2023-12-13 ENCOUNTER — Encounter: Payer: Self-pay | Admitting: Physician Assistant

## 2023-12-13 VITALS — BP 162/89 | HR 75 | Resp 20 | Ht 72.0 in | Wt 218.0 lb

## 2023-12-13 DIAGNOSIS — G3184 Mild cognitive impairment, so stated: Secondary | ICD-10-CM

## 2023-12-13 DIAGNOSIS — R413 Other amnesia: Secondary | ICD-10-CM

## 2023-12-13 DIAGNOSIS — G309 Alzheimer's disease, unspecified: Secondary | ICD-10-CM | POA: Diagnosis not present

## 2023-12-13 DIAGNOSIS — F0670 Mild neurocognitive disorder due to known physiological condition without behavioral disturbance: Secondary | ICD-10-CM | POA: Diagnosis not present

## 2023-12-13 MED ORDER — MEMANTINE HCL 10 MG PO TABS: ORAL_TABLET | ORAL | 11 refills | Status: DC

## 2023-12-13 NOTE — Progress Notes (Signed)
 Assessment/Plan:   Mild cognitive impairment due to Alzheimer's disease  Jeremy [name] is a very pleasant 63 y.o. RH male with a history of hypertension, hyperlipidemia, history of PE on chronic Coumadin , insomnia, chronic cholecystitis, arthritis, GERD, anxiety and a diagnosis of mild cognitive impairment per neuropsych evaluation on December 2024 presenting today in follow-up for evaluation of memory loss.  Unfortunately, FDG PET scan is remarkable for findings consistent Alzheimer's disease. Discussed initiating  antidementia medication, and patient agrees to proceed.  Given his young age, we discussed referring the patient to [facility] for other therapeutic options, understanding that he may not qualify for medications like lecanemab, or Donanemab or perhaps, there are other studies that they are currently available for him.   Recommendations:   Follow up in 6 months. Start memantine  10 mg twice daily as directed, side effects discussed (history of chronic diarrhea) Repeat neuropsych evaluation in 12 months for diagnostic clarity and disease trajectory Follow-up sleep apnea with pulmonary  Recommend psychotherapy for anxiety and situational depression Replenish B12 (178) Monitor driving, recommend using a copilot with driving only short distances Recommend good control of cardiovascular risk factors Continue to control mood as per PCP, he is on Klonopin.    Subjective:   This patient is accompanied in the office by his wife who supplements the history. Previous records as well as any outside records available were reviewed prior to todays visit.   Patient was last seen on 06/07/2023, at which time his MoCA was 19/30.     Any changes in memory since last visit?   About the same.  He continues to have difficulty comprehending instructions especially at work, recent conversations and names. repeats oneself?  Endorsed Disoriented when walking into a room?  Patient denies    Misplacing  objects?  May misplace his glasses, phone, but not in unusual places.  Wandering behavior?   denies   Any personality changes since last visit?  Has been very anxious lately, wife says. As before, he may become irritable and frustrated with his cognitive difficulties, reporting that his anxiety is worse. Any worsening depression?:  He denies, he has never seen a psychotherapist or psychiatrist for this issue. Hallucinations or paranoia?  denies   Seizures?   denies    Any sleep changes? Sleeping better with Klonopin. Had sleep studies , but is not thrilled to start using CPAP for his mild to moderate OSA, he is investigating other options.  Occasionally he has vivid dreams, and REM behavior.  He denies any sleepwalking.  Any hygiene concerns?   denies   Independent of bathing and dressing?  Endorsed  Does the patient needs help with medications? Patient is in charge Who is in charge of the finances?  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    Any changes in appetite?  denies . He may forget to eat at times Patient have trouble swallowing?  denies   Does the patient cook? Yes   Any kitchen accidents such as leaving the stove on?   denies   Any headaches?    denies   Vision changes? denies Chronic pain?  denies   Ambulates with difficulty?    denies    Recent falls or head injuries?    denies      Unilateral weakness, numbness or tingling?  denies   Any tremors?  denies   Any anosmia?    denies   Any incontinence of urine? Incontinence, takes Flomax Any bowel dysfunction?  He has a history of chronic diarrhea.    Patient lives with his wife Drives?  Endorsed, he does have anxiety while driving, becomes hesitant when he has to turn in any road that he has familiarity with.  He is able to drive long distances at night.    MRI of the  brain 06/02/23. personally reviewed, remarkable for chronic ischemic disease, generalized moderate cerebral volume loss, no acute findings, no atrophy is noted.   Metabolic PET scan remarkable for marked decrease loss of relative cortical metabolism in the high parietal lobes, pattern typical of Alzheimer's disease pathology.  There is loss of cortical metabolism within the occipital lobes which is not typical of Alzheimer's disease, considering posterior cortical atrophy.   Initial visit 04/12/2023 How long did patient have memory difficulties?  About 1 year ago, the patient reported having difficulty remembering recent conversations and people names and has become worse, provoking anxiety which is hindering him from performing other activities.  He is to be very good with numbers, and now he has significant difficulty.  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  Endorsed, not often  Disoriented when walking into a room?  Patient denies   Leaving objects in unusual places?  Misplaces things and cannot remember where they are such as the watch, keys, telephone, glasses, etc.  Wandering behavior?  denies   Any personality changes since last visit?  He becomes frustrated with his cognitive difficulties, and reports that his anxiety is worse  Any history of depression?:  Patient denies, he has never been seen by psychotherapist or psychiatrist. Hallucinations or paranoia?  Patient denies   Seizures?   Patient denies    Any sleep changes?  Insomnia is worse, now sleeping about 3 hours at night.  His PCP placed him on Klonopin, which has not been helping, and he is to discuss with his physician a new medication.  He also discussed with his PCP future sleep studies, has been entertaining this idea.    Occasionally  vivid dreams, REM behavior or sleepwalking.Sometimes I chase someone in my dreams and feel I am  running  Sleep apnea?  Patient denies   Any hygiene concerns?  Patient denies   Independent of bathing and dressing?  Endorsed  Does the patient needs help with medications?  Patient is in charge   Who is in charge of the finances?  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    Any changes in appetite?  denies     Patient have trouble swallowing? denies   Does the patient cook?  No  Any kitchen accidents such as leaving the stove on? denies   Any headaches?   History of migraines, on Imitrex for a long time, has not had any for about 3 years.  Chronic back pain ?denies   Ambulates with difficulty?  denies   Recent falls or head injuries? Last week he had a mechanical fall hitting his elbow, no head injury and no LOC Vision changes? denies   Unilateral weakness, numbness or tingling? denies   Any tremors?   denies  Any anosmia?  denies   Any incontinence of urine? Getting worse.  Any bowel dysfunction?  Chronic diarrhea  Patient lives  wife History of heavy alcohol intake? denies   History of heavy tobacco use? denies   Family history of dementia? MGM with dementia of unknown type and one uncle with PD dementia   Does patient drive?  I get anxious driving and gets hesitant where to turn, even in roads that has been before, decisions are skewed.  . Drives long distances at night  Masters in professional counseling   Neuropsych evaluation 12/11/2024Briefly, results suggested severe impairment surrounding executive functioning, visuospatial abilities, and all aspects of learning and memory. Additional impairments were exhibited across processing speed and complex attention. The cause for ongoing impairment is unclear at the present time. I cannot rule out an early-onset Alzheimer's disease presentation. Retention rates across memory testing ranged  from 0% to 60% and he generally performed very poorly across yes/no recognition trials. This would suggest concerns for rapid forgetting and an evolving storage impairment, both of which represent the hallmark testing characteristic of this illness. Despite this, his degree of executive and visuospatial impairment is quite advanced relative to a classic Alzheimer's disease presentation in earlier stages. The degree of intact expressive language abilities would also be unexpected in a typically presenting Alzheimer's disease presentation. This could elevate the risk for an atypical presentation such as the frontal variant or posterior cortical atrophy. Further analysis will be necessary to achieve greater diagnostic clarity with regard to Alzheimer's disease concerns.   Labs May 2024 TSH 1.63, B12 178  Past Medical History:  Diagnosis Date   Arthritis    both knees    Biliary dyskinesia 06/06/2022   Chronic cholecystitis with calculus 05/23/2022   GERD (gastroesophageal reflux disease)    Headache    migraines on occasion - used imitrex 5- 6 yrs. ago    History of kidney stones    cystoscopy x1, also has past spontaneously   Hypertension    Insomnia 08/29/2022   Irritable bowel syndrome (IBS)    Major depressive disorder 10/03/2022   Mild cognitive impairment, severe, uncertain or unknown etiology 11/07/2023   Peripheral vascular disease 2009   post op; treated at [facility]   PONV (postoperative nausea and vomiting)    Primary osteoarthritis of right knee 03/05/2016   Pulmonary embolism 1992   post op- 1 week     Seasonal allergies      Past Surgical History:  Procedure Laterality Date   ANKLE SURGERY Right    repair of fracture & repair of torn tendons.   BIOPSY  11/02/2022   Procedure: BIOPSY;  Surgeon: Cindie Carlin POUR, DO;  Location: [facility];  Service: Endoscopy;;   CARDIAC CATHETERIZATION     diagnostic only   CHOLECYSTECTOMY  08/2022   COLONOSCOPY WITH PROPOFOL  N/A  11/02/2022   Procedure: COLONOSCOPY WITH PROPOFOL ;  Surgeon: Cindie Carlin POUR, DO;  Location: [facility];  Service: Endoscopy;  Laterality: N/A;  1:15pm, asa 3   KNEE SURGERY     8 arthroscopies & one repair of tendon- R    PERIPHERALLY INSERTED CENTRAL CATHETER INSERTION Right 2001   staph infection in R knee, post surgical, treated with antibiotics     POLYPECTOMY  11/02/2022   Procedure: POLYPECTOMY;  Surgeon: Cindie Carlin POUR, DO;  Location: [facility];  Service: Endoscopy;;   TONSILLECTOMY     TOTAL KNEE ARTHROPLASTY Left 03/05/2016   Procedure: LEFT TOTAL KNEE ARTHROPLASTY;  Surgeon: Norleen Gavel, MD;  [facility] OR;  Service: Orthopedics;  Laterality: Left;   TOTAL KNEE ARTHROPLASTY Right 10/22/2016   TOTAL KNEE ARTHROPLASTY Right 10/22/2016   Procedure: TOTAL KNEE ARTHROPLASTY;  Surgeon: Norleen Gavel, MD;  Location: [facility] OR;  Service: Orthopedics;  Laterality: Right;   VOCAL CORD INJECTION  2002   bilateral medialization, implants on both vocal chords , done at [facility].      PREVIOUS MEDICATIONS:   CURRENT MEDICATIONS:  Outpatient Encounter Medications as of 12/13/2023  Medication Sig   aspirin  81 MG chewable tablet Chew 81 mg by mouth daily.   clonazePAM (KLONOPIN) 0.5 MG tablet Take 0.5 mg by mouth at bedtime.   Eszopiclone  3 MG TABS TAKE 1 TABLET BY MOUTH immediately BEFORE bedtime   fexofenadine (ALLEGRA) 180 MG tablet Take 180 mg by mouth daily before breakfast.   imipramine (TOFRANIL) 10 MG tablet Take 20 mg by mouth at bedtime.   memantine  (NAMENDA ) 10 MG tablet Take 1 tablet (10 mg at night) for 2 weeks, then increase to 1 tablet (10 mg) twice a day   metoprolol  succinate (TOPROL -XL) 25 MG 24 hr tablet Take 25 mg by mouth at bedtime.   olmesartan (BENICAR) 40 MG tablet Take 40 mg by mouth daily.   omeprazole (PRILOSEC) 20 MG capsule Take 20-40 mg by mouth 2 (two) times daily before a meal. 40 mg every morning and 20 mg every evening   tamsulosin (FLOMAX) 0.4 MG  CAPS capsule Take 0.4 mg by mouth at bedtime.   warfarin (COUMADIN ) 3 MG tablet Take 3 mg by mouth daily at 6 PM.   hydrochlorothiazide  (HYDRODIURIL ) 25 MG tablet Take 25 mg by mouth daily. (Patient not taking: Reported on 12/13/2023)   No facility-administered encounter medications on file as of 12/13/2023.     Objective:     PHYSICAL EXAMINATION:    VITALS:   Vitals:   12/13/23 1136 12/13/23 1301  BP: (!) 158/91 (!) 162/89  Pulse: 75   Resp: 20   SpO2: 99%   Weight: 218 lb (98.9 kg)   Height: 6' (1.829 m)     GEN:  The patient appears stated age and is in NAD. HEENT:  Normocephalic, atraumatic.   Neurological examination:  General: NAD, well-groomed, appears stated age. Orientation: The patient is alert. Oriented to person, place and date Cranial nerves: There is good facial symmetry.The speech is fluent and clear. No aphasia or dysarthria. Fund of knowledge is appropriate. Recent memory impaired and remote memory is normal.  Attention and concentration are normal.  Able to name objects and repeat phrases.  Hearing is intact to conversational tone.    Sensation: Sensation is intact to [place] touch throughout Motor: Strength is at least antigravity x4. DTR's 2/4 in UE/LE      04/12/2023   10:10 AM  Montreal Cognitive Assessment   Visuospatial/ Executive (0/5) 1  Naming (0/3) 3  Attention: Read list of digits (0/2) 2  Attention: Read list of letters (0/1) 1  Attention: Serial 7 subtraction starting at 100 (0/3) 1  Language: Repeat phrase (0/2) 0  Language : Fluency (0/1) 0  Abstraction (0/2) 2  Delayed Recall (0/5) 4  Orientation (0/6) 5  Total 19  Adjusted Score (based on education) 19        No data to display             Movement examination: Tone: There is normal tone in the UE/LE Abnormal movements:  no tremor.  No  myoclonus.  No asterixis.   Coordination:  There is no decremation with RAM's. Normal finger to nose  Gait and Station: The patient has no  difficulty arising out of a deep-seated chair without the use of the hands. The patient's stride length is good.  Gait is cautious and narrow.   Thank you for allowing us  the opportunity to participate in the care of this nice patient. Please do not hesitate to contact us  for any questions or concerns.   Total time spent on today's visit was 37 minutes dedicated to this patient today, preparing to see patient, examining the patient, ordering tests and/or medications and counseling the patient, documenting clinical information in the EHR or other health record, independently interpreting results and communicating results to the patient/family, discussing treatment and goals, answering patient's questions and coordinating care.  Cc:  Toribio Jerel MATSU, MD  Camie Sevin 12/13/2023 4:36 PM

## 2023-12-13 NOTE — Patient Instructions (Addendum)
 It was a pleasure to see you today at our office.   Recommendations:  Continue B12 daily    Recommend psychotherapy for anxiety Referral to [facility]  10 mg: Take 1 tablet (10 mg at night) for 2 weeks, then increase to 1 tablet (10 mg) twice a day.    Follow up July 11 11:30 am  Recommend CPAP.BiPAP     For assessment of decision of mental capacity and competency:  Call Dr. Rosaline Nine, geriatric psychiatrist at [phone] Counseling regarding caregiver distress, including caregiver depression, anxiety and issues regarding community resources, adult day care programs, adult living facilities, or memory care questions:  please contact your  Primary Doctor's Social Worker   Whom to call: Memory  decline, memory medications: Call our office [phone]  For psychiatric meds, mood meds: Please have your primary care physician manage these medications.  If you have any severe symptoms of a stroke, or other severe issues such as confusion,severe chills or fever, etc call 911 or go to the ER as you may need to be evaluated further    RECOMMENDATIONS FOR ALL PATIENTS WITH MEMORY PROBLEMS: 1. Continue to exercise (Recommend 30 minutes of walking everyday, or 3 hours every week) 2. Increase social interactions - continue going to [place] and enjoy social gatherings with friends and family 3. Eat healthy, avoid fried foods and eat more fruits and vegetables 4. Maintain adequate blood pressure, blood sugar, and blood cholesterol level. Reducing the risk of stroke and cardiovascular disease also helps promoting better memory. 5. Avoid stressful situations. Live a simple life and avoid aggravations. Organize your time and prepare for the next day in anticipation. 6. Sleep well, avoid any interruptions of sleep and avoid any distractions in the bedroom that may interfere with adequate sleep quality 7. Avoid sugar, avoid sweets as there is a strong link between excessive sugar intake,  diabetes, and cognitive impairment We discussed the Mediterranean diet, which has been shown to help patients reduce the risk of progressive memory disorders and reduces cardiovascular risk. This includes eating fish, eat fruits and green leafy vegetables, nuts like almonds and hazelnuts, walnuts, and also use olive oil. Avoid fast foods and fried foods as much as possible. Avoid sweets and sugar as sugar use has been linked to worsening of memory function.  There is always a concern of gradual progression of memory problems. If this is the case, then we may need to adjust level of care according to patient needs. Support, both to the patient and caregiver, should then be put into place.      You have been referred for a neuropsychological evaluation (i.e., evaluation of memory and thinking abilities). Please bring someone with you to this appointment if possible, as it is helpful for the doctor to hear from both you and another adult who knows you well. Please bring eyeglasses and hearing aids if you wear them.    The evaluation will take approximately 3 hours and has two parts:   The first part is a clinical interview with the neuropsychologist (Dr. Richie or Dr. Jackquline). During the interview, the neuropsychologist will speak with you and the individual you brought to the appointment.    The second part of the evaluation is testing with the doctor's technician Neal or Luke). During the testing, the technician will ask you to remember different types of material, solve problems, and answer some questionnaires. Your family member will not be present for this portion of the evaluation.  Please note: We must reserve several hours of the neuropsychologist's time and the psychometrician's time for your evaluation appointment. As such, there is a No-Show fee of $100. If you are unable to attend any of your appointments, please contact our office as soon as possible to reschedule.    FALL PRECAUTIONS:  Be cautious when walking. Scan the area for obstacles that may increase the risk of trips and falls. When getting up in the mornings, sit up at the edge of the bed for a few minutes before getting out of bed. Consider elevating the bed at the head end to avoid drop of blood pressure when getting up. Walk always in a well-lit room (use night lights in the walls). Avoid area rugs or power cords from appliances in the middle of the walkways. Use a walker or a cane if necessary and consider physical therapy for balance exercise. Get your eyesight checked regularly.  FINANCIAL OVERSIGHT: Supervision, especially oversight when making financial decisions or transactions is also recommended.  HOME SAFETY: Consider the safety of the kitchen when operating appliances like stoves, microwave oven, and blender. Consider having supervision and share cooking responsibilities until no longer able to participate in those. Accidents with firearms and other hazards in the house should be identified and addressed as well.   ABILITY TO BE LEFT ALONE: If patient is unable to contact 911 operator, consider using LifeLine, or when the need is there, arrange for someone to stay with patients. Smoking is a fire hazard, consider supervision or cessation. Risk of wandering should be assessed by caregiver and if detected at any point, supervision and safe proof recommendations should be instituted.  MEDICATION SUPERVISION: Inability to self-administer medication needs to be constantly addressed. Implement a mechanism to ensure safe administration of the medications.   DRIVING: Regarding driving, in patients with progressive memory problems, driving will be impaired. We advise to have someone else do the driving if trouble finding directions or if minor accidents are reported. Independent driving assessment is available to determine safety of driving.   If you are interested in the driving assessment, you can contact the  following:  [employer] in [place] [phone]  [facility] [phone]  [facility] [phone] or [phone]    Mediterranean Diet A Mediterranean diet refers to food and lifestyle choices that are based on the traditions of countries located on the [employer] [place]. This way of eating has been shown to help prevent certain conditions and improve outcomes for people who have chronic diseases, like kidney disease and heart disease. What are tips for following this plan? Lifestyle  Cook and eat meals together with your family, when possible. Drink enough fluid to keep your urine clear or pale yellow. Be physically active every day. This includes: Aerobic exercise like running or swimming. Leisure activities like gardening, walking, or housework. Get 7-8 hours of sleep each night. If recommended by your health care provider, drink red wine in moderation. This means 1 glass a day for nonpregnant women and 2 glasses a day for men. A glass of wine equals 5 oz (150 mL). Reading food labels  Check the serving size of packaged foods. For foods such as rice and pasta, the serving size refers to the amount of cooked product, not dry. Check the total fat in packaged foods. Avoid foods that have saturated fat or trans fats. Check the ingredients list for added sugars, such as corn syrup. Shopping  At the grocery  store, buy most of your food from the areas near the walls of the store. This includes: Fresh fruits and vegetables (produce). Grains, beans, nuts, and seeds. Some of these may be available in unpackaged forms or large amounts (in bulk). Fresh seafood. Poultry and eggs. Low-fat dairy products. Buy whole ingredients instead of prepackaged foods. Buy fresh fruits and vegetables in-season from local farmers markets. Buy frozen fruits and vegetables in resealable bags. If you do not have access to quality  fresh seafood, buy precooked frozen shrimp or canned fish, such as tuna, salmon, or sardines. Buy small amounts of raw or cooked vegetables, salads, or olives from the deli or salad bar at your store. Stock your pantry so you always have certain foods on hand, such as olive oil, canned tuna, canned tomatoes, rice, pasta, and beans. Cooking  Cook foods with extra-virgin olive oil instead of using butter or other vegetable oils. Have meat as a side dish, and have vegetables or grains as your main dish. This means having meat in small portions or adding small amounts of meat to foods like pasta or stew. Use beans or vegetables instead of meat in common dishes like chili or lasagna. Experiment with different cooking methods. Try roasting or broiling vegetables instead of steaming or sauteing them. Add frozen vegetables to soups, stews, pasta, or rice. Add nuts or seeds for added healthy fat at each meal. You can add these to yogurt, salads, or vegetable dishes. Marinate fish or vegetables using olive oil, lemon juice, garlic, and fresh herbs. Meal planning  Plan to eat 1 vegetarian meal one day each week. Try to work up to 2 vegetarian meals, if possible. Eat seafood 2 or more times a week. Have healthy snacks readily available, such as: Vegetable sticks with hummus. Greek yogurt. Fruit and nut trail mix. Eat balanced meals throughout the week. This includes: Fruit: 2-3 servings a day Vegetables: 4-5 servings a day Low-fat dairy: 2 servings a day Fish, poultry, or lean meat: 1 serving a day Beans and legumes: 2 or more servings a week Nuts and seeds: 1-2 servings a day Whole grains: 6-8 servings a day Extra-virgin olive oil: 3-4 servings a day Limit red meat and sweets to only a few servings a month What are my food choices? Mediterranean diet Recommended Grains: Whole-grain pasta. Brown rice. Bulgar wheat. Polenta. Couscous. Whole-wheat bread. [place] Madeira. Vegetables: Artichokes.  Beets. Broccoli. Cabbage. Carrots. Eggplant. Green beans. Chard. Kale. Spinach. Onions. Leeks. Peas. Squash. Tomatoes. Peppers. Radishes. Fruits: Apples. Apricots. Avocado. Berries. Bananas. Cherries. Dates. Figs. Grapes. Lemons. Melon. Oranges. Peaches. Plums. Pomegranate. Meats and other protein foods: Beans. Almonds. Sunflower seeds. Pine nuts. Peanuts. Cod. Salmon. Scallops. Shrimp. Tuna. Tilapia. Clams. Oysters. Eggs. Dairy: Low-fat milk. Cheese. Greek yogurt. Beverages: Water. Red wine. Herbal tea. Fats and oils: Extra virgin olive oil. Avocado oil. Grape seed oil. Sweets and desserts: Greek yogurt with honey. Baked apples. Poached pears. Trail mix. Seasoning and other foods: Basil. Cilantro. Coriander. Cumin. Mint. Parsley. Sage. Rosemary. Tarragon. Garlic. Oregano. Thyme. Pepper. Balsalmic vinegar. Tahini. Hummus. Tomato sauce. Olives. Mushrooms. Limit these Grains: Prepackaged pasta or rice dishes. Prepackaged cereal with added sugar. Vegetables: Deep fried potatoes (french fries). Fruits: Fruit canned in syrup. Meats and other protein foods: Beef. Pork. Lamb. Poultry with skin. Hot dogs. [place]. Dairy: Ice cream. Sour cream. Whole milk. Beverages: Juice. Sugar-sweetened soft drinks. Beer. Liquor and spirits. Fats and oils: Butter. Canola oil. Vegetable oil. Beef fat (tallow). Lard. Sweets and desserts: Cookies. Cakes. Pies. Candy.  Seasoning and other foods: Mayonnaise. Premade sauces and marinades. The items listed may not be a complete list. Talk with your dietitian about what dietary choices are right for you. Summary The Mediterranean diet includes both food and lifestyle choices. Eat a variety of fresh fruits and vegetables, beans, nuts, seeds, and whole grains. Limit the amount of red meat and sweets that you eat. Talk with your health care provider about whether it is safe for you to drink red wine in moderation. This means 1 glass a day for nonpregnant women and 2 glasses a day  for men. A glass of wine equals 5 oz (150 mL). This information is not intended to replace advice given to you by your health care provider. Make sure you discuss any questions you have with your health care provider. Document Released: 07/12/2016 Document Revised: 08/14/2016 Document Reviewed: 07/12/2016 Elsevier Interactive Patient Education  2017 [employer].

## 2023-12-17 ENCOUNTER — Telehealth: Payer: Self-pay | Admitting: Physician Assistant

## 2023-12-17 NOTE — Telephone Encounter (Signed)
 Pt's wife called in and left a message. She is returning a call from [place]

## 2023-12-17 NOTE — Telephone Encounter (Signed)
 I called KIM POA, and wife. She wrote down [facility] number [phone], she thanked me for calling. All clinicals sent to [facility] for referral. Did ask her to pick up [employer] and Mri CD to take to his appt. Verbally understood an all Questions answered.

## 2024-01-02 ENCOUNTER — Encounter: Payer: Self-pay | Admitting: Physician Assistant

## 2024-01-06 ENCOUNTER — Other Ambulatory Visit: Payer: Self-pay

## 2024-01-07 ENCOUNTER — Ambulatory Visit: Payer: Self-pay

## 2024-01-07 ENCOUNTER — Institutional Professional Consult (permissible substitution): Payer: Managed Care, Other (non HMO) | Admitting: Psychology

## 2024-01-14 ENCOUNTER — Encounter: Payer: Managed Care, Other (non HMO) | Admitting: Psychology

## 2024-01-26 NOTE — Progress Notes (Signed)
 MEMORY DISORDERS CLINIC  Chief Complaint  Patient presents with  . New Patient    History Jeremy [name]. (Arti) is a 63 y.o. right-handed Caucasian male with a PMHx of HTN, HLD, PE (30 + years ago following a knee operation); on chronic Coumadin , insomnia, mood disorder, chronic cholecystitis, arthritis, GERD, history of sleep apnea, low vitamin B12. Notably, patient has family history of late onset unspecified memory disorder (maternal grandmother with onset in 62s) and Parkinson's disease (maternal uncle with onset in 37s). He was seen in consultation for evaluation regarding his memory difficulties at the request of the referring provider, Self, or because they or their family scheduled the visit for the memory evaluation (Self Referral). He presented with his wife, Luke, and daughter, Glade, who contributed to the history.   Review of prior clinic notes: On 04/12/2023 patient established with PA Camie Sevin at [facility] regarding memory difficulties.  He was most recently seen there on 12/13/2023.  It has been noted patient had prior neuropsychological evaluation in 11/2023 that reported mild cognitive impairment.  He also underwent FDG PET scan that was noted to have findings concerning for Alzheimer's disease.  Plan at the time of the visit included, referral to [facility] for further recommendations of medication options and possible clinical research trial opportunities.  HPI: The cognitive difficulty began  approximately 2 years ago which represents an intra-individual change that has gradually worsened since onset.   The patient discusses he initially noticed he was misplacing his personal items and gradually overtime noticed some other changes.  He was still working but was under more stress.  He had difficulties with utilizing the computer system.  He was asked to stop working in 04/2023.  He also had noticed to be had become more irritable with the dogs, which was  unlike him.  The patient's wife and daughter who accompany him today also discussed concerns initially seen with executive function difficulties, trouble following instructions which was unlike him.  They share likely trigger was lots of stress at work.  His wife shares that their other daughter picked up on the changes and had shared them with his wife.  He has some difficulties making decisions.  His wife also has picked up on him having difficulties with driving on the highway although she feels he does well with driving locally.  There is some cognitive processing difficulty/slowing.  He started Namenda  10 mg twice daily approximately in 12/2023, and they have not noticed a significant change in cognition since starting the medication.  He has not tried any other medications for memory or cognition.  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  He does have ongoing short-term memory difficulties, repetition.  His wife feels likely since onset of memory difficulties the repetition of questions has a shorter interval span.  He has good long-term memory.  He is not forgetting names of familiar people to him.  He has some word finding difficulties, some hesitation when answering questions.  Denies history of apathy, loss of sympathy or empathy, preservative/stereotyped/compulsive or ritualistic behaviors, hyperorality or dietary changes.  Pertinent cognitive ROS  Visuospatial: Jeremy [name]. has no difficulty putting toothpaste on the toothbrush. He has no difficulties dressing.  Executive/Behaviors:  -No difficulty driving, managing finances or medications.  Patient's wife took over management of the finances after he stopped working but not due to his cognitive difficulties but rather because she was the only one working still.  He  does well with driving to local familiar locations but not on the highway. -No hallucinations. -There is some anxiety or depression related to the cognitive  difficulties.   Sleep: Jeremy [name]. discusses concerns for chronic insomnia.  He reports he has been on multiple different medications to help with insomnia.  His PCP has recently started him on Klonopin nightly and they feel this has been helpful.  He does nap sometimes in the afternoon.  He does snore at night.  He has completed a sleep study about a year ago and his family shares that he was diagnosed with sleep apnea but he did not want to go on PAP.  There is report of vivid dreams with some activity happening several years ago but not recently.  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  At this time, no significant RBD symptoms expressed.  Walking/falls:  There are no walking difficulties or balance issues.  No recent falls.   Motor symptoms Tremor: No Myoclonus: No Witnessed seizure tonic-clonic activity: No Postural instability/falls: No  Independent with Instrumental Activities of Daily Living (iADLs) Yes   No  Driving [x]  limits locally []     Finances []    [x]  wife is managing but not due to cognitive difficulties; she feels he may be able to help with management if needed  Medications [x]    []      Independent with Activities of Daily Living (ADLs) Yes   No  Bathing [x]    []     Toileting [x]    []     Personal Hygiene [x]    []     Dressing [x]  patient prefers to wear the same clothes repeatedly, typically they are clean clothes []     Feeding [x]    []       Pertinent findings on ROS include: Please see HPI for pertinent positive ROS  No past medical history on file.  History reviewed. No pertinent surgical history.  Current Outpatient Medications  Medication Sig Dispense Refill  . aspirin  81 MG EC tablet Take 81 mg by mouth once daily    . clonazePAM (KLONOPIN) 0.5 MG tablet Take 0.5 mg by mouth at bedtime    . hydroCHLOROthiazide  (HYDRODIURIL ) 25 MG tablet Take 25 mg by mouth once daily    . memantine  (NAMENDA ) 10 MG tablet Take 20 mg by mouth once daily    . metoprolol  SUCCinate  (TOPROL -XL) 25 MG XL tablet Take 25 mg by mouth at bedtime    . olmesartan (BENICAR) 40 MG tablet Take 40 mg by mouth once daily    . omeprazole (PRILOSEC) 20 MG DR capsule Take 20 mg by mouth 3 (three) times daily    . warfarin (COUMADIN ) 3 MG tablet Take 3 mg by mouth once daily     No current facility-administered medications for this visit.    Allergies as of 01/28/2024 - Reviewed 01/28/2024  Allergen Reaction Noted  . Morphine Nausea 03/05/2016  . Oxycodone -acetaminophen  Itching 05/23/2022    No family history on file.  Social History  Jeremy [name]. currently lives with his wife  He is not working.  Patient is retired.  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  Stopped working in 04/2023.  Recently approved SSDI.  Education history: Masters degree in counseling  Alcohol  Yes  []      Socially/Rarely []     No  Alcohol   [x]      Cigarettes Yes  []   No   [x]        History of head trauma   Yes  [x]      No   []      patient reports playing football in high school, had likely instances of head trauma.  He also reports history of being hit in the head by a horse several years ago.   Military Service  Yes  []      No   [x]        Vital signs Vitals:   01/28/24 1336 01/28/24 1342 01/28/24 1512  BP: (!) 167/106 (!) 161/108 (!) 181/98  BP Location: Left upper arm Left upper arm Left upper arm  Patient Position: Sitting Standing Sitting  BP Cuff Size: Large Adult Large Adult Adult  Pulse: 80 80 82  Weight: 99.3 kg (219 lb)     There is no height or weight on file to calculate BMI.  Neurologic Exam:  Mental status: Alert and oriented to person, month, year, day of the week, hospital system, and city. Comprehension to simple and multistep commands was intact.   Speech: No aphasia. No dysarthria. Cranial Nerves:  III,IV,VI: EOMI w/o nystagmus. No ptosis. VII: Face symmetric  without weakness. VIII: Hearing grossly intact to voice. IX,X: Voice normal, uvula and palate elevates symmetrically in midline. XI: SCM/trapezius strength and shrug symmetric b/l.  XII: Tongue protrudes midline with full range of motion. No atrophy or fasciculations.  Motor: Normal bulk and tone. No tremors noted at rest. Intact rapid alternating movements with finger tapping and foot tapping. Able to raise upper extremities above head for at least 5 seconds. Sensory: No drift of the arms when fully extended Coordination: FTN intact b/l without dysmetria or ataxia.  Station/Gait: Able to stand without the use of his arms. No difficulty with initiation. Normal casual gait, arm swing, stride. No en bloc turning. Patient able to perform tandem gait without difficulty. Negative Romberg. Negative Retropulsion pull test.   Information for the [facility] derived from the questionnaires submitted.   Questionnaires for Patient: [facility] Patient Health Questionnaire (Phq-8)   01/26/2024 11:22 AM EST - Filed by Patient  PHQ-8 score (range: 0 - 24) 3  GDS (short version) score (range: 0 - 15) 4  GAD-7 Total Score (range: 0 - 21) 0  Epworth Sleepiness Scale Patient Score (range: 0 - 24) 5      [facility] Questionnaires For Glendora Keri Narciso Sherlynn Friend   01/26/2024 11:14 AM EST - Filed by Patient  Person Completing Questionnaire Luke Mallory  Relationship to patient Husband/wife/partner  Lives with patient Yes  Knowledge of patient's abilities: To a great extent   [facility] Clinical Informant Questionnaire   01/26/2024 11:14 AM EST - Filed by Patient  Duration of memory/cognitive problems (years) 2  Progression of memory/cognitive problems Gradual progression  Onset of memory/cognitive problems Gradual  Triggering event of memory/cognitive problems Yes  Event that triggered memory/cognitive problems Tremendous workload increase  Seizure in the past 5 years No   Father's age (or age at death/loss of contact) 1  Paternal memory disorder No  Mother's age (or age at death/loss of contact) 33  Maternal memory disorder No  Number of siblings 2  Sibling(s) memory disorder No  Extended family memory disorders Yes  Extended family with memory disorder Grandmother and uncle, great aunts  Years of formal education 49  Durable Power of Attorney No  Advanced Directive No  From 0 (Absent) to 10 (Extreme) how would you grade  YOUR day to day stress as a Caregiver: 3  Current Smoker No  Drinks alcohol No  Hallucinations No  Hearing problems No  Has hearing aids? No  Sleeping well Yes  Snores Loudly Yes  Acting out dreams No  Choke, gasp, or breath holding while asleep No  Has the patient been diagnosed with sleep apnea? Yes  Sleep Apnea / CPAP Use Yes /  Does not regularly wear mask  Excess daytime somnolence No   [facility] Assessment Of Cognition   01/26/2024 11:14 AM EST - Filed by Patient  Patient's memory worse Yes  Difficulty finding words Yes  Ask same questions multiple times Yes  Difficulty keeping track of dates Yes  Difficulty following then remembering the story No  Difficulty driving a car Yes  [facility] Assessment of  Cognition Score (range: 0 - 6) 5   [facility] Assessment Of Adl's-Katz   01/26/2024 11:14 AM EST - Filed by Patient  Shower or bathe independently Yes  Dress independently Yes  Go to the bathroom independently Yes  Get out of a chair or bed without help Yes  Eat a meal with no assistance Yes  More than occasional loss of bowel or bladder function Yes  [facility] Assessment of  ADL-Katz Score (range: 0 - 6) 0   [facility] Assessment Of Function-Faq   01/26/2024 11:14 AM EST - Filed by Patient  Difficulty with house work Has difficulty but is able to do by themselves  Difficulty with meal prep Has difficulty but is able to do by themselves  Difficulty using technology Needs some assistance  Difficulty navigating locally No difficulties   Diffculty navigating new places Unable to do  Difficulty managing meds No difficulties  Difficulties with finances Unable to do  Difficulty with social function Only attends functions accompanied by a friend/family member  Launder clothes No difficulties  Shopping for food A few things independently  Length of time OK to be home alone A full day  DAF-FAQ (range: 0 - 30) 13   The Neuropsychiatric Inventory-Questionnaire (Npi-Q)   01/26/2024 11:14 AM EST - Filed by Patient  Delusions No  Hallucinations No  Agitation/Aggression No  Depression/Dysphoria Yes  Depression/Dysphoria: Patient severity Moderate  Depression/Dysphoria: Caregiver distress Mild  Anxiety No  Elation/Euphoria No  Apathy/Indifference Yes  Apathy/Indifference: Patient severity Moderate  Apathy/Indifference: Caregiver distress Mild  Disinhibition No  Irritability/Lability No  Motor Disturbance No  Nighttime Behaviors No  Appetite/Eating Yes  Appetite/Eating: Patient severity Moderate  Appetite/Eating: Caregiver distress Mild  NPI-Q Total Severity Score (range: 0 - 36) 6  NPI-Q Total Care Giver Distress Score (range: 0 - 60) 6  NPI-Q Total Score (range: 0 - 96) 12      Cognitive Testing        01/28/2024    8:45 PM  MOCA  Trails 0  Cube 1  Clock 2  Naming 3  Digit Span 2  Letter A 1  Serial 7s 2  Sentence Repetition 1  Fluency 1  Abstraction 2  Orientation 5  Memory 3  Education level 0  Total Score 23  Acutal or Adjusted Denominator 30    06/07/2023 MoCA: 19/30   Previous diagnostics  Prior Imaging:    MRI brain without contrast on 06/02/2023 - completed at [facility]) FINDINGS:  Brain: Moderate cerebral volume loss with ventriculomegaly. Chronic  small vessel ischemia in the cerebral white matter with small  chronic blood products in the left centrum semiovale and posterior  left  putamen. No specific pattern of volume loss. No acute or  subacute infarct, hemorrhage,  hydrocephalus, collection, or masslike  finding.   Vascular: Normal flow voids.   Skull and upper cervical spine: Normal marrow signal.   Sinuses/Orbits: Negative.   IMPRESSION:  1. No specific or reversible cause for symptoms.  2. Generalized, moderate cerebral volume loss. Mild chronic small  vessel ischemia.   -------------------------------------------------------------------------------------------------  FDG PET scan on 11/28/2023 - completed at [facility])  IMPRESSION:  Marked decrease loss of relative cortical metabolism in the high  parietal lobes is a pattern typical of Alzheimer's disease  pathology. Loss of cortical metabolism within the occipital lobes is  not typical of Alzheimer's disease pathology. Consider posterior  cortical atrophy in differential for cognitive impairment.   FDG metabolic brain imaging is an adjuvant test used as in  conjunction with clinical evaluation in assessing cognitive  impairment.   Prior neuropsychological testing:  11/06/2023 - Neuropsychological evaluation at [facility] neurology; Dr. Arthea Maryland, PhD  Briefly, results suggested severe impairment surrounding executive functioning, visuospatial abilities, and all aspects of learning and memory. Additional impairments were exhibited across processing speed and complex attention. The cause for ongoing impairment is unclear at the present time. I cannot rule out an early-onset Alzheimer's disease presentation. Retention rates across memory testing ranged from 0% to 60% and he generally performed very poorly across yes/no recognition trials. This would suggest concerns for rapid forgetting and an evolving storage impairment, both of which represent the hallmark testing characteristic of this illness. Despite this, his degree of executive and visuospatial impairment is quite advanced relative to a classic Alzheimer's disease presentation in earlier  stages. The degree of intact expressive language abilities would also be unexpected in a typically presenting Alzheimer's disease presentation. This could elevate the risk for an atypical presentation such as the frontal variant or posterior cortical atrophy. Further analysis will be necessary to achieve greater diagnostic clarity with regard to Alzheimer's disease concerns.  EKG No results found for: ENEDELIA CISCO, QRSINTERVAL, QTINTERVAL, QTC  Sleep study No report available for personal review  EEG  None  Labs 04/12/2023 TSH: wnl Vitamin B12: 178   ID No results found for: HCVAB, HBSAG, HBCAB, HEPBSAB, HIV1COPY, HIV1LOG, HIV1RNAQNT, CD3, CD3CT, CD4, CD4CT, LABRPR, CHLAMDNA, NGDNA  Urine Drug Screen  No results found for: AMPHETUR, BARBURINE, BENZOUR, COCAINESCRN, OPIATESUR, THCURINE  No results for input(s): NA, K, CL, CO2, BUN, CREATININE, GLUCOSE, CALCIUM, GFR in the last 8760 hours.  No results for input(s): CHOLTOTAL, LDLCALC, HDL, TRIG in the last 8760 hours.   No results for input(s): WBC, HGB, HCT, PLT, MCV, MCHC, RDWCV, NEUTCT, LYMPHCT, LYMPHOPCT, MONOCT, MONOPCT, EOSCT, EOSPCT, BASOCT, BASOPCT, IMMGRANCOUNT, IGP, SEG, BAND, LYMPH, MONO, META, MYELOCYTE, NRBC, VLYMPH, BLAST in the last 8760 hours.  Invalid input(s): NEUTROPHILPCT  No results for input(s): ALT, AST, ALKPHOS, ALB, TBILI, CONJBILI, UNCONBILI, BILINEO, INR, APTT, PEDSTRIG, TRIG in the last 8760 hours.  Invalid input(s): TPROT  No results found for: HGBA1C  No results for input(s): TSH, T4FREE in the last 8760 hours.  No results for input(s): VITB12, FOLATE in the last 8760 hours.  Invalid input(s): METHYMALONIC ACID  Encounter Diagnosis and Orders: 1. Mild cognitive impairment     ASSESSMENT Jeremy Mallory Jr.'s syndrome  is best described as visuospatial/executive, attention, language, and memory difficulties consistent with possibly the late stages of mild cognitive impairment (MCI). The cause of his symptoms is likely due to underlying Alzheimer's disease pathology,  possibly visuospatial/posterior cortical atrophy variant, as well as cerebrovascular contributions. His history of HTN, HLD, prior PE, insomnia, unmanaged sleep apnea, mood disorder, Klonopin use, low vitamin B12 and prior remote head trauma could be contributing to his symptoms. Notably, patient has family history of late onset unspecified memory disorder (maternal grandmother with onset in 42s) and Parkinson's disease (maternal uncle with onset in 25s).  Decision making capacity: Yes  PLAN   Problem: Cognitive concerns  Plan:  -Discussed and advised to follow-up with PCP and other medical providers for management/optimization of chronic medical conditions including vascular risk factors -Discussed the importance of ensuring good kidney function with use of Namenda  -Discussed the consideration of completing biomarker testing to confirm AD pathology but at this time deferred as it would not necessarily change management currently -Not a good candidate for antiamyloid therapy at [facility] given chronic use of blood thinner (Coumadin ).  I reviewed the patient and family if they are interested in these therapies could potentially see [facility] neurology for further assessment.  I reviewed that the patient likely is higher risk due to current microhemorrhages seen on brain MRI in 05/2023. -Discussed the consideration of clinical research trials at [facility] and if interested in learning more if patient would be a good candidate for any new research trials coming soon to please contact our clinical research coordinator -Recommended for patient to review with his local neurology provider about seeing OT for ADL skills assessment as well as cognitive training  Problem:  Affective and behavioral components Plan:  -Continue to monitor and follow-up with local neurology or PCP -Consider talk therapy or psychiatry  Problem: Sleep concerns - chronic insomnia, untreated sleep apnea, 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  Reviewed the consideration of possible LBD but feel this is low likely at this time and symptoms may be related to untreated sleep apnea.  Problem: Low vitamin B12, limited diet with use of Coumadin  Plan: -Recommend for patient to begin taking vitamin B12 1000 mcg daily and request PCP to recheck levels in a few months.  I recommend keeping levels above 400. -Patient family reviewed that due to use of Coumadin  patient cannot eat leafy greens so the Mediterranean diet has been difficult to abide by.  Recommended for patient to consider seeing a nutritionist and if interested to review further with local neurology or PCP.  Problem: Microhemorrhages, blood products seen on brain MRIs in 05/2023 + chronic use of Coumadin  Plan: -Discussed and advised the patient consider repeating brain MRI with local neurology provider in 05/2024 for reevaluation  Problem: Elevated BP in clinic Plan: -Advised the patient to monitor his BP daily for the next week and keep a log as well as update his PCP for further recommendations if BP remains elevated  Problem: Functional limitations Plan:  -Refer to [facility] Dementia Family Support Program  -Provided direct contact information  of our Social Workers should patient/family have questions regarding care planning, coping/adjustment strategies, etc. Appreciate recommendations from our Social Workers.   Return to clinic  in Return for : Discharge to Primary Care Provider and local neurology for Follow up.  Advised to reach out with any questions or concerns.  I spent a total of 90 minutes in both face-to-face and non-face-to-face activities for this visit on the date of this encounter. This included chart review, interpretation of results, counseling, education, ordering meds/tests/procedures, and/or coordination of care and excluding procedures.   Please note that portions of this note may have been written with the aid of dictation software.  Please excuse inadvertent typographical errors.     Attestation Statement:   I personally performed the service, non-incident to. (WP)   Heba Al-Sahlani, PA-C [facility]

## 2024-01-28 ENCOUNTER — Ambulatory Visit: Payer: Managed Care, Other (non HMO) | Admitting: Physician Assistant

## 2024-01-28 NOTE — Progress Notes (Signed)
 Patient arrived in clinic today accompanied by wife and daughter for the appointment with the scheduled provider. Patient was called back to exam room 18 for initial intake from nursing staff and identified via name and date of birth per protocol.   Nursing staff assessed height and weight and obtained accurate vital signs.    In active dialogue with patient reviewed chief complaint, completed a falls risk assessment for safety needs, reviewed allergies, did a complete medication review including updating the preferred pharmacy with the location and contact numbers.  The patient's pain was assessed with the corresponding pain scale noted in the pain flowsheet.  All questions and concerns addressed at this time regarding the patient's visit thus far.  Refill needs and personal or clinic paperwork was discussed.     Nursing staff provided the patient with their appropriate paperwork per providers request to complete for pending visit.   The patient was notified that clinic staff, in the effort to keep them safe, are escorting patients who are identified as Falls Risks to their cars after appointments. Patient declined a falls prevention escort to car.

## 2024-03-27 ENCOUNTER — Encounter: Payer: Self-pay | Admitting: Physician Assistant

## 2024-05-24 ENCOUNTER — Encounter: Payer: Self-pay | Admitting: Physician Assistant

## 2024-06-12 ENCOUNTER — Ambulatory Visit: Payer: Managed Care, Other (non HMO) | Admitting: Physician Assistant

## 2024-06-19 ENCOUNTER — Ambulatory Visit: Admitting: Physician Assistant

## 2024-06-23 ENCOUNTER — Ambulatory Visit: Admitting: Physician Assistant

## 2024-06-23 VITALS — BP 159/88 | HR 70 | Ht 72.0 in | Wt 208.0 lb

## 2024-06-23 DIAGNOSIS — G3184 Mild cognitive impairment, so stated: Secondary | ICD-10-CM

## 2024-06-23 DIAGNOSIS — R413 Other amnesia: Secondary | ICD-10-CM

## 2024-06-23 MED ORDER — MEMANTINE HCL 10 MG PO TABS: ORAL_TABLET | ORAL | 3 refills | Status: DC

## 2024-06-23 NOTE — Progress Notes (Signed)
 Assessment/Plan:    Mild cognitive impairment   Jeremy [name] [name] is Jeremy [name] very pleasant 63 [name].o. RH male with Jeremy [name] history of hypertension, hyperlipidemia, history of PE on chronic Coumadin , insomnia, chronic cholecystitis, arthritis, GERD, anxiety and Jeremy [name] diagnosis of mild cognitive impairment per neuropsych evaluation on December 2024 with etiology due to Alzheimer's disease per positive FDG PET scan presenting today in follow-up for evaluation of memory loss.  In the interim, he was referred to [facility] for other therapeutic options.  He was deemed not Jeremy [name] good candidate for antiamyloid therapy at [facility] given chronic use of blood thinner (Coumadin ) being at risk of cerebral bleeding.  They discussed the consideration of clinical research trials, but they are weighing the pros and cons at this time.  [facility] had offered biomarker testing to confirm AD pathology but they deferred as it would not necessarily change the management at this time.[facility] also recommended to repeat the MRI brain for further evaluation of any structural changes and vascular load.  Patient is on memantine  10 mg twice daily, tolerating well. Patient is able to participate on ADLs and to drive without difficulties. Mood is anxious at times, discussed the role of psychotherapy in cognition.       Recommendations:   Follow up in  6 months. Repeat neuropsych evaluation  as scheduled for diagnostic clarity and disease trajectory Repeat MRI brain for further evaluation of structural abnormalities and vascular load Follow-up sleep apnea with pulmonary Recommend psychotherapy for anxiety and situational depression Continue to replenish B12 Monitor driving, recommend using Jeremy [name] copilot and short distances only  recommend good control of cardiovascular risk factors      Subjective:   This patient is accompanied in the office by his wife who supplements the history. Previous records as well as any outside records available were reviewed prior to  todays visit.   Patient was last seen on 12/13/2023.    Any changes in memory since last visit? About the same. He continues to have short-term memory difficulties, especially with recent conversations, as well as hesitancy with some words trying to find the right word.  He does not forget the names of people familiar to him.  He also has difficulty comprehending instructions and making decisions.  LTM is fair. repeats oneself?  Endorsed  Disoriented when walking into Jeremy [name] room?  Patient denies    Misplacing objects?  Patient misplaces his glasses, phone but not in unusual places.  Wandering behavior?   Denies. Any personality changes since last visit?  He has moments of irritability related to his cognitive difficulties, he has increased anxiety because he is very hard on himself-she says. Any worsening depression?  There may be situational depression although this is not formally diagnosed he has never seen Jeremy [name] psychotherapist or psychiatrist.  Hallucinations or paranoia?  Denies.   Seizures?   Denies.    Any sleep changes? Sleeps well with Klonopin.  Denies vivid dreams, REM behavior or sleepwalking   Sleep apnea?  Endorsed, does not like to use CPAP   Any hygiene concerns?   Denies.   Independent of bathing and dressing?  Endorsed  Does the patient needs help with medications? Patient is in charge   Who is in charge of the finances?  Wife is in charge     Any changes in appetite?  He may forget to eat at times    Patient have trouble swallowing?  Denies.   Does the patient cook?  Yes, where any kitchen accidents such  as leaving the stove on?   Denies.   Any headaches?    Denies.   Vision changes? Denies. Chronic pain?  Denies.   Ambulates with difficulty?    Denies.  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 Denies.      Unilateral weakness, numbness or tingling?  Denies.   Any tremors?  Denies.   Any anosmia?    Denies.   Any  incontinence of urine?  Endorsed, takes Flomax Any bowel dysfunction?  He has Jeremy [name] history of chronic diarrhea, improved recently Patient lives with his wife.  Does the patient drive?  Yes, but he has become hesitant, has increased anxiety while driving.    MRI of the brain 06/02/23. personally reviewed, remarkable for chronic ischemic disease, generalized moderate cerebral volume loss, no acute findings, no atrophy is noted.    Metabolic PET scan remarkable for marked decrease loss of relative cortical metabolism in the high parietal lobes, pattern typical of Alzheimer's disease pathology.  There is loss of cortical metabolism within the occipital lobes which is not typical of Alzheimer's disease, considering posterior cortical atrophy.   Initial visit 04/12/2023 How long did patient have memory difficulties?  About 1 year ago, the patient reported having difficulty remembering recent conversations and people names and has become worse, provoking anxiety which is hindering him from performing other activities.  He is to be very good with numbers, and now he has significant difficulty.  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  Endorsed, not often  Disoriented when walking into Jeremy [name] room?  Patient denies   Leaving objects in unusual places?  Misplaces things and cannot remember where they are such as the watch, keys, telephone, glasses, etc.  Wandering behavior?  denies   Any personality changes since last visit?  He becomes frustrated with his cognitive difficulties, and reports that his anxiety is worse  Any history of depression?:  Patient denies, he has never been seen by psychotherapist or psychiatrist. Hallucinations or paranoia?  Patient denies   Seizures?   Patient denies    Any sleep changes?  Insomnia is worse, now sleeping about 3 hours at night.  His PCP placed him on Klonopin, which has not  been helping, and he is to discuss with his physician Jeremy [name] new medication.  He also discussed with his PCP future sleep studies, has been entertaining this idea.    Occasionally  vivid dreams, REM behavior or sleepwalking.Sometimes I chase someone in my dreams and feel I am running  Sleep apnea?  Patient denies   Any hygiene concerns?  Patient denies   Independent of bathing and dressing?  Endorsed  Does the patient needs help with medications?  Patient is in charge   Who is in charge of the finances?  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    Any changes in appetite?  denies     Patient have trouble swallowing? denies   Does the patient cook?  No  Any kitchen accidents such as leaving the stove on? denies   Any headaches?   History of migraines, on Imitrex for Jeremy [name] long time, has not had any for about 3 years.  Chronic  back pain ?denies   Ambulates with difficulty?  denies   Recent falls or head injuries? Last week he had Jeremy [name] mechanical fall hitting his elbow, no head injury and no LOC Vision changes? denies   Unilateral weakness, numbness or tingling? denies   Any tremors?   denies   Any anosmia?  denies   Any incontinence of urine? Getting worse.  Any bowel dysfunction?  Chronic diarrhea  Patient lives  wife History of heavy alcohol intake? denies   History of heavy tobacco use? denies   Family history of dementia? MGM with dementia of unknown type and one uncle with PD dementia   Does patient drive?  I get anxious driving and gets hesitant where to turn, even in roads that has been before, decisions are skewed.  . Drives long distances at night  Masters in professional counseling    Neuropsych evaluation 12/11/2024Briefly, results suggested severe impairment surrounding executive functioning, visuospatial abilities, and all aspects of learning  and memory. Additional impairments were exhibited across processing speed and complex attention. The cause for ongoing impairment is unclear at the present time. I cannot rule out an early-onset Alzheimer's disease presentation. Retention rates across memory testing ranged from 0% to 60% and he generally performed very poorly across yes/no recognition trials. This would suggest concerns for rapid forgetting and an evolving storage impairment, both of which represent the hallmark testing characteristic of this illness. Despite this, his degree of executive and visuospatial impairment is quite advanced relative to Jeremy [name] classic Alzheimer's disease presentation in earlier stages. The degree of intact expressive language abilities would also be unexpected in Jeremy [name] typically presenting Alzheimer's disease presentation. This could elevate the risk for an atypical presentation such as the frontal variant or posterior cortical atrophy. Further analysis will be necessary to achieve greater diagnostic clarity with regard to Alzheimer's disease concerns.  Past Medical History:  Diagnosis Date   Arthritis    both knees    Biliary dyskinesia 06/06/2022   Chronic cholecystitis with calculus 05/23/2022   GERD (gastroesophageal reflux disease)    Headache    migraines on occasion - used imitrex 5- 6 yrs. ago    History of kidney stones    cystoscopy x1, also has past spontaneously   Hypertension    Insomnia 08/29/2022   Irritable bowel syndrome (IBS)    Major depressive disorder 10/03/2022   Mild cognitive impairment, severe, uncertain or unknown etiology 11/07/2023   Peripheral vascular disease 2009   post op; treated at [facility]   PONV (postoperative nausea and vomiting)    Primary osteoarthritis of right knee 03/05/2016   Pulmonary embolism 1992   post op- 1 week     Seasonal allergies      Past Surgical History:  Procedure Laterality Date   ANKLE SURGERY Right    repair of fracture & repair of torn  tendons.   BIOPSY  11/02/2022   Procedure: BIOPSY;  Surgeon: Cindie Carlin POUR, DO;  Location: [facility];  Service: Endoscopy;;   CARDIAC CATHETERIZATION     diagnostic only   CHOLECYSTECTOMY  08/2022   COLONOSCOPY WITH PROPOFOL  N/Jeremy [name] 11/02/2022   Procedure: COLONOSCOPY WITH PROPOFOL ;  Surgeon: Cindie Carlin POUR, DO;  Location: [facility];  Service: Endoscopy;  Laterality: N/Jeremy [name];  1:15pm, asa 3   KNEE SURGERY     8 arthroscopies & one repair of tendon- R    PERIPHERALLY INSERTED CENTRAL CATHETER INSERTION Right 2001   staph infection in R knee, post surgical, treated with  antibiotics     POLYPECTOMY  11/02/2022   Procedure: POLYPECTOMY;  Surgeon: Cindie Carlin POUR, DO;  Location: [facility];  Service: Endoscopy;;   TONSILLECTOMY     TOTAL KNEE ARTHROPLASTY Left 03/05/2016   Procedure: LEFT TOTAL KNEE ARTHROPLASTY;  Surgeon: Norleen Gavel, MD;  Location: [facility] OR;  Service: Orthopedics;  Laterality: Left;   TOTAL KNEE ARTHROPLASTY Right 10/22/2016   TOTAL KNEE ARTHROPLASTY Right 10/22/2016   Procedure: TOTAL KNEE ARTHROPLASTY;  Surgeon: Norleen Gavel, MD;  Location: [facility] OR;  Service: Orthopedics;  Laterality: Right;   VOCAL CORD INJECTION  2002   bilateral medialization, implants on both vocal chords , done at [facility].      PREVIOUS MEDICATIONS:   CURRENT MEDICATIONS:  Outpatient Encounter Medications as of 06/23/2024  Medication Sig   aspirin  81 MG chewable tablet Chew 81 mg by mouth daily.   clonazePAM (KLONOPIN) 0.5 MG tablet Take 0.5 mg by mouth at bedtime.   Eszopiclone  3 MG TABS TAKE 1 TABLET BY MOUTH immediately BEFORE bedtime   fexofenadine (ALLEGRA) 180 MG tablet Take 180 mg by mouth daily before breakfast.   hydrochlorothiazide  (HYDRODIURIL ) 25 MG tablet Take 25 mg by mouth daily.   imipramine (TOFRANIL) 10 MG tablet Take 20 mg by mouth at bedtime.   metoprolol  succinate (TOPROL -XL) 25 MG 24 hr tablet Take 25 mg by mouth at bedtime.   olmesartan (BENICAR) 40 MG tablet Take  40 mg by mouth daily.   omeprazole (PRILOSEC) 20 MG capsule Take 20-40 mg by mouth 2 (two) times daily before Jeremy [name] meal. 40 mg every morning and 20 mg every evening   tamsulosin (FLOMAX) 0.4 MG CAPS capsule Take 0.4 mg by mouth at bedtime.   warfarin (COUMADIN ) 3 MG tablet Take 3 mg by mouth daily at 6 PM.   [DISCONTINUED] memantine  (NAMENDA ) 10 MG tablet Take 1 tablet (10 mg at night) for 2 weeks, then increase to 1 tablet (10 mg) twice Jeremy [name] day   memantine  (NAMENDA ) 10 MG tablet Take 1 tablet twice Jeremy [name] day   No facility-administered encounter medications on file as of 06/23/2024.     Objective:     PHYSICAL EXAMINATION:    VITALS:   Vitals:   06/23/24 1119  BP: (!) 159/88  Pulse: 70  SpO2: 99%  Weight: 208 lb (94.3 kg)  Height: 6' (1.829 m)    GEN:  The patient appears stated age and is in NAD. HEENT:  Normocephalic, atraumatic.   Neurological examination:  General: NAD, well-groomed, appears stated age. Orientation: The patient is alert. Oriented to person, place and not to date.  Cranial nerves: There is good facial symmetry.The speech is fluent and clear. No aphasia or dysarthria. Fund of knowledge is appropriate. Recent memory impaired and remote memory is normal.  Attention and concentration are normal.  Able to name objects and repeat phrases.  Hearing is intact to conversational tone.  Sensation: Sensation is intact to Opara touch throughout Motor: Strength is at least antigravity x4. DTR's 2/4 in UE/LE      04/12/2023   10:10 AM  Montreal Cognitive Assessment   Visuospatial/ Executive (0/5) 1  Naming (0/3) 3  Attention: Read list of digits (0/2) 2  Attention: Read list of letters (0/1) 1  Attention: Serial 7 subtraction starting at 100 (0/3) 1  Language: Repeat phrase (0/2) 0  Language : Fluency (0/1) 0  Abstraction (0/2) 2  Delayed Recall (0/5) 4  Orientation (0/6) 5  Total 19  Adjusted Score (based  on education) 19        No data to display              Movement examination: Tone: There is normal tone in the UE/LE Abnormal movements:  no tremor.  No myoclonus.  No asterixis.   Coordination:  There is no decremation with RAM's. Normal finger to nose  Gait and Station: The patient has no difficulty arising out of Jeremy [name] deep-seated chair without the use of the hands. The patient's stride length is good.  Gait is cautious and narrow.   Thank you for allowing us  the opportunity to participate in the care of this nice patient. Please do not hesitate to contact us  for any questions or concerns.   Total time spent on today's visit was 33 minutes dedicated to this patient today, preparing to see patient, examining the patient, ordering tests and/or medications and counseling the patient, documenting clinical information in the EHR or other health record, independently interpreting results and communicating results to the patient/family, discussing treatment and goals, answering patient's questions and coordinating care.  Cc:  Toribio Jerel MATSU, MD  Camie Sevin 06/23/2024 12:28 PM

## 2024-06-23 NOTE — Patient Instructions (Addendum)
 It was a pleasure to see you today at our office.   Recommendations:  Continue B12 daily    Recommend psychotherapy for anxiety Continue Memantine  10 mg twice a day.    Follow up Feb 12  11:30 am   Repeat the MRI brain prior to the neuropsych visit    For assessment of decision of mental capacity and competency:  Call Dr. Rosaline Nine, geriatric psychiatrist at [phone] Counseling regarding caregiver distress, including caregiver depression, anxiety and issues regarding community resources, adult day care programs, adult living facilities, or memory care questions:  please contact your  Primary Doctor's Social Worker   Whom to call: Memory  decline, memory medications: Call our office [phone]  For psychiatric meds, mood meds: Please have your primary care physician manage these medications.  If you have any severe symptoms of a stroke, or other severe issues such as confusion,severe chills or fever, etc call 911 or go to the ER as you may need to be evaluated further    RECOMMENDATIONS FOR ALL PATIENTS WITH MEMORY PROBLEMS: 1. Continue to exercise (Recommend 30 minutes of walking everyday, or 3 hours every week) 2. Increase social interactions - continue going to [place] and enjoy social gatherings with friends and family 3. Eat healthy, avoid fried foods and eat more fruits and vegetables 4. Maintain adequate blood pressure, blood sugar, and blood cholesterol level. Reducing the risk of stroke and cardiovascular disease also helps promoting better memory. 5. Avoid stressful situations. Live a simple life and avoid aggravations. Organize your time and prepare for the next day in anticipation. 6. Sleep well, avoid any interruptions of sleep and avoid any distractions in the bedroom that may interfere with adequate sleep quality 7. Avoid sugar, avoid sweets as there is a strong link between excessive sugar intake, diabetes, and cognitive impairment We discussed the Mediterranean  diet, which has been shown to help patients reduce the risk of progressive memory disorders and reduces cardiovascular risk. This includes eating fish, eat fruits and green leafy vegetables, nuts like almonds and hazelnuts, walnuts, and also use olive oil. Avoid fast foods and fried foods as much as possible. Avoid sweets and sugar as sugar use has been linked to worsening of memory function.  There is always a concern of gradual progression of memory problems. If this is the case, then we may need to adjust level of care according to patient needs. Support, both to the patient and caregiver, should then be put into place.      You have been referred for a neuropsychological evaluation (i.e., evaluation of memory and thinking abilities). Please bring someone with you to this appointment if possible, as it is helpful for the doctor to hear from both you and another adult who knows you well. Please bring eyeglasses and hearing aids if you wear them.    The evaluation will take approximately 3 hours and has two parts:   The first part is a clinical interview with the neuropsychologist (Dr. Richie or Dr. Jackquline). During the interview, the neuropsychologist will speak with you and the individual you brought to the appointment.    The second part of the evaluation is testing with the doctor's technician Neal or Luke). During the testing, the technician will ask you to remember different types of material, solve problems, and answer some questionnaires. Your family member will not be present for this portion of the evaluation.   Please note: We must reserve several hours of the neuropsychologist's time and the  psychometrician's time for your evaluation appointment. As such, there is a No-Show fee of $100. If you are unable to attend any of your appointments, please contact our office as soon as possible to reschedule.    FALL PRECAUTIONS: Be cautious when walking. Scan the area for obstacles that may  increase the risk of trips and falls. When getting up in the mornings, sit up at the edge of the bed for a few minutes before getting out of bed. Consider elevating the bed at the head end to avoid drop of blood pressure when getting up. Walk always in a well-lit room (use night lights in the walls). Avoid area rugs or power cords from appliances in the middle of the walkways. Use a walker or a cane if necessary and consider physical therapy for balance exercise. Get your eyesight checked regularly.  FINANCIAL OVERSIGHT: Supervision, especially oversight when making financial decisions or transactions is also recommended.  HOME SAFETY: Consider the safety of the kitchen when operating appliances like stoves, microwave oven, and blender. Consider having supervision and share cooking responsibilities until no longer able to participate in those. Accidents with firearms and other hazards in the house should be identified and addressed as well.   ABILITY TO BE LEFT ALONE: If patient is unable to contact 911 operator, consider using LifeLine, or when the need is there, arrange for someone to stay with patients. Smoking is a fire hazard, consider supervision or cessation. Risk of wandering should be assessed by caregiver and if detected at any point, supervision and safe proof recommendations should be instituted.  MEDICATION SUPERVISION: Inability to self-administer medication needs to be constantly addressed. Implement a mechanism to ensure safe administration of the medications.   DRIVING: Regarding driving, in patients with progressive memory problems, driving will be impaired. We advise to have someone else do the driving if trouble finding directions or if minor accidents are reported. Independent driving assessment is available to determine safety of driving.   If you are interested in the driving assessment, you can contact the following:  [facility] [employer] [facility] in [place]  [phone]  [facility] [phone]  [facility] [phone] or [phone]    Mediterranean Diet A Mediterranean diet refers to food and lifestyle choices that are based on the traditions of countries located on the [employer]. This way of eating has been shown to help prevent certain conditions and improve outcomes for people who have chronic diseases, like kidney disease and heart disease. What are tips for following this plan? Lifestyle  Cook and eat meals together with your family, when possible. Drink enough fluid to keep your urine clear or pale yellow. Be physically active every day. This includes: Aerobic exercise like running or swimming. Leisure activities like gardening, walking, or housework. Get 7-8 hours of sleep each night. If recommended by your health care provider, drink red wine in moderation. This means 1 glass a day for nonpregnant women and 2 glasses a day for men. A glass of wine equals 5 oz (150 mL). Reading food labels  Check the serving size of packaged foods. For foods such as rice and pasta, the serving size refers to the amount of cooked product, not dry. Check the total fat in packaged foods. Avoid foods that have saturated fat or trans fats. Check the ingredients list for added sugars, such as corn syrup. Shopping  At the grocery store, buy most of your food from the areas near the walls of  the store. This includes: Fresh fruits and vegetables (produce). Grains, beans, nuts, and seeds. Some of these may be available in unpackaged forms or large amounts (in bulk). Fresh seafood. Poultry and eggs. Low-fat dairy products. Buy whole ingredients instead of prepackaged foods. Buy fresh fruits and vegetables in-season from local farmers markets. Buy frozen fruits and vegetables in resealable bags. If you do not have access to quality fresh seafood, buy precooked frozen shrimp or canned  fish, such as tuna, salmon, or sardines. Buy small amounts of raw or cooked vegetables, salads, or olives from the deli or salad bar at your store. Stock your pantry so you always have certain foods on hand, such as olive oil, canned tuna, canned tomatoes, rice, pasta, and beans. Cooking  Cook foods with extra-virgin olive oil instead of using butter or other vegetable oils. Have meat as a side dish, and have vegetables or grains as your main dish. This means having meat in small portions or adding small amounts of meat to foods like pasta or stew. Use beans or vegetables instead of meat in common dishes like chili or lasagna. Experiment with different cooking methods. Try roasting or broiling vegetables instead of steaming or sauteing them. Add frozen vegetables to soups, stews, pasta, or rice. Add nuts or seeds for added healthy fat at each meal. You can add these to yogurt, salads, or vegetable dishes. Marinate fish or vegetables using olive oil, lemon juice, garlic, and fresh herbs. Meal planning  Plan to eat 1 vegetarian meal one day each week. Try to work up to 2 vegetarian meals, if possible. Eat seafood 2 or more times a week. Have healthy snacks readily available, such as: Vegetable sticks with hummus. Greek yogurt. Fruit and nut trail mix. Eat balanced meals throughout the week. This includes: Fruit: 2-3 servings a day Vegetables: 4-5 servings a day Low-fat dairy: 2 servings a day Fish, poultry, or lean meat: 1 serving a day Beans and legumes: 2 or more servings a week Nuts and seeds: 1-2 servings a day Whole grains: 6-8 servings a day Extra-virgin olive oil: 3-4 servings a day Limit red meat and sweets to only a few servings a month What are my food choices? Mediterranean diet Recommended Grains: Whole-grain pasta. Brown rice. Bulgar wheat. Polenta. Couscous. Whole-wheat bread. [place] Madeira. Vegetables: Artichokes. Beets. Broccoli. Cabbage. Carrots. Eggplant. Green  beans. Chard. Kale. Spinach. Onions. Leeks. Peas. Squash. Tomatoes. Peppers. Radishes. Fruits: Apples. Apricots. Avocado. Berries. Bananas. Cherries. Dates. Figs. Grapes. Lemons. Melon. Oranges. Peaches. Plums. Pomegranate. Meats and other protein foods: Beans. Almonds. Sunflower seeds. Pine nuts. Peanuts. Cod. Salmon. Scallops. Shrimp. Tuna. Tilapia. Clams. Oysters. Eggs. Dairy: Low-fat milk. Cheese. Greek yogurt. Beverages: Water. Red wine. Herbal tea. Fats and oils: Extra virgin olive oil. Avocado oil. Grape seed oil. Sweets and desserts: [place] yogurt with honey. Baked apples. Poached pears. Trail mix. Seasoning and other foods: Basil. Cilantro. Coriander. Cumin. Mint. Parsley. Sage. Rosemary. Tarragon. Garlic. Oregano. Thyme. Pepper. Balsalmic vinegar. Tahini. Hummus. Tomato sauce. Olives. Mushrooms. Limit these Grains: Prepackaged pasta or rice dishes. Prepackaged cereal with added sugar. Vegetables: Deep fried potatoes (french fries). Fruits: Fruit canned in syrup. Meats and other protein foods: Beef. Pork. Lamb. Poultry with skin. Hot dogs. [place]. Dairy: Ice cream. Sour cream. Whole milk. Beverages: Juice. Sugar-sweetened soft drinks. Beer. Liquor and spirits. Fats and oils: Butter. Canola oil. Vegetable oil. Beef fat (tallow). Lard. Sweets and desserts: Cookies. Cakes. Pies. Candy. Seasoning and other foods: Mayonnaise. Premade sauces and marinades. The items listed may  not be a complete list. Talk with your dietitian about what dietary choices are right for you. Summary The Mediterranean diet includes both food and lifestyle choices. Eat a variety of fresh fruits and vegetables, beans, nuts, seeds, and whole grains. Limit the amount of red meat and sweets that you eat. Talk with your health care provider about whether it is safe for you to drink red wine in moderation. This means 1 glass a day for nonpregnant women and 2 glasses a day for men. A glass of wine equals 5 oz (150  mL). This information is not intended to replace advice given to you by your health care provider. Make sure you discuss any questions you have with your health care provider. Document Released: 07/12/2016 Document Revised: 08/14/2016 Document Reviewed: 07/12/2016 Elsevier Interactive Patient Education  2017 [employer].

## 2024-07-09 ENCOUNTER — Ambulatory Visit: Admitting: Physician Assistant

## 2024-07-12 ENCOUNTER — Ambulatory Visit
Admission: RE | Admit: 2024-07-12 | Discharge: 2024-07-12 | Disposition: A | Discharge status: Home / Self Care | Source: Ambulatory Visit | Attending: Physician Assistant | Admitting: Physician Assistant

## 2024-07-12 DIAGNOSIS — R413 Other amnesia: Secondary | ICD-10-CM

## 2024-07-12 MED ORDER — GADOPICLENOL 0.5 MMOL/ML IV SOLN
10.0000 mL | Freq: Once | INTRAVENOUS | Status: AC | PRN
  Administered 2024-07-12: 10 mL via INTRAVENOUS

## 2024-07-20 ENCOUNTER — Ambulatory Visit: Payer: Self-pay | Admitting: Neurology

## 2024-11-12 ENCOUNTER — Ambulatory Visit: Payer: Self-pay

## 2024-11-12 ENCOUNTER — Ambulatory Visit: Payer: Managed Care, Other (non HMO) | Admitting: Psychology

## 2024-11-12 DIAGNOSIS — F0280 Dementia in other diseases classified elsewhere without behavioral disturbance: Secondary | ICD-10-CM | POA: Diagnosis not present

## 2024-11-12 DIAGNOSIS — G308 Other Alzheimer's disease: Secondary | ICD-10-CM | POA: Diagnosis not present

## 2024-11-12 DIAGNOSIS — R4189 Other symptoms and signs involving cognitive functions and awareness: Secondary | ICD-10-CM

## 2024-11-12 HISTORY — DX: Dementia in other diseases classified elsewhere, unspecified severity, without behavioral disturbance, psychotic disturbance, mood disturbance, and anxiety: F02.80

## 2024-11-12 NOTE — Progress Notes (Signed)
° °  Psychometrician Note   Cognitive testing was administered to Jeremy [name] by Lonell Jude, B.S. (psychometrist) under the supervision of Dr. Arthea KYM Maryland, Ph.D., ABPP, licensed psychologist on 11/12/2024. Jeremy [name] did not appear overtly distressed by the testing session per behavioral observation or responses across self-report questionnaires. Rest breaks were offered.   The battery of tests administered was selected by Dr. Arthea KYM Maryland, Ph.D., ABPP with consideration to Jeremy [name] current level of functioning, the nature of his symptoms, emotional and behavioral responses during interview, level of literacy, observed level of motivation/effort, and the nature of the referral question. This battery was communicated to the psychometrist. Communication between Dr. Arthea KYM Maryland, Ph.D., ABPP and the psychometrist was ongoing throughout the evaluation and Dr. Arthea KYM Maryland, Ph.D., ABPP was immediately accessible at all times. Dr. Zachary C. Merz, Ph.D., ABPP provided supervision to the psychometrist on the date of this service to the extent necessary to assure the quality of all services provided.    Jeremy [name] will return within approximately 1-2 weeks for an interactive feedback session with Dr. Maryland at which time his test performances, clinical impressions, and treatment recommendations will be reviewed in detail. Jeremy [name] understands he can contact our office should he require our assistance before this time.  A total of 160 minutes of billable time were spent face-to-face with Jeremy [name] by the psychometrist. This includes both test administration and scoring time. Billing for these services is reflected in the clinical report generated by Dr. Arthea KYM Maryland, Ph.D., ABPP  This note reflects time spent with the psychometrician and does not include test scores or any clinical interpretations made by Dr. Maryland. The full report will follow in a separate note.

## 2024-11-12 NOTE — Progress Notes (Unsigned)
 NEUROPSYCHOLOGICAL EVALUATION [place] Department of Neurology  Date of Evaluation: November 12, 2024  Reason for Referral:   Jeremy [name] is a 63 y.o. right-handed Caucasian male referred by Jeremy Sevin, PA-C, to characterize his current cognitive functioning and assist with diagnostic clarity and treatment planning in the context of ongoing cognitive impairment and concern for progressive worsening over time.   Assessment and Plan:   Clinical Impression(s): Jeremy [name] pattern of performance is suggestive of diffuse cognitive impairment. He exhibits quite severe impairment surrounding executive functioning, visuospatial abilities, and learning/memory in particular. Additional impairments were exhibited across processing speed, attention/concentration, receptive language, and confrontation naming. Performance variability was exhibited across expressive language. Unfortunately, no cognitive domains exhibited appropriate performances relative to age-matched peers. Functionally, Jeremy [name] had to take a leave of absence from work in May 2024 due to memory decline and has been unable to return. He has also had to greatly diminish driving over time. He reportedly remains independent with medication management. Given the diffuse and severe nature of cognitive impairment, coupled with evidence that this is directly interfering with day-to-day functioning, he meets diagnostic criteria for a Major Neurocognitive Disorder (dementia) at the present time.  Relative to his previous evaluation in December 2024, decline was exhibited across basic attention, receptive language, confrontation naming, clock drawing, and delayed retrieval/consolidation aspects of a list learning verbal memory test. Decline was generally mild in severity. Other assessed domains exhibited relative stability.  Regarding the cause for his dementia presentation, concerns were previously expressed  for an atypical Alzheimer's variant given his relatively young age, advanced impairment, and overall testing patterns. In the interim, he had an FDG-PET scan performed. 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 This illness represents my primary concern for the cause of cognitive impairment and progressive decline over time. Continued medical monitoring will be important moving forward.    Recommendations: Given the degree of impairment, repeat testing is not recommended due to the likelihood that meaningful change will be unable to be exhibited across testing.   Jeremy [name]. [place] should continue to work with his neurology team, whether that be here or at [facility], to consider the most appropriate medication regiment given Alzheimer's disease and PCA concerns. It is important to highlight that these medications have been shown to slow functional decline in some individuals. There is no current treatment which can stop or reverse cognitive decline when caused by a neurodegenerative illness.   I strongly advise Jeremy [name] to fully abstain from all driving pursuits given the severity of cognitive impairment. Should he or his family wish to pursue a formalized driving evaluation, they could reach out to the following agencies: [employer] in [place]: [phone] [facility]: [phone] [facility]: [phone] [facility]: [phone] or [phone]  It will be important for Jeremy [name] to have another person with him when in situations where he may need to process information, weigh the pros and cons of different options, and make decisions, in order to ensure that he fully understands and recalls all information to be considered. He will likely  benefit from the establishment and maintenance of a routine in order to maximize his functional abilities over time.  If not already done, Jeremy [name] and his family may want to discuss his wishes regarding durable  power of attorney and medical decision making, so that he can have input into these choices. If they require legal assistance with this, long-term care resource access, or other aspects of estate planning, they could reach out to [employer] at [phone] for a free consultation. Additionally, they may wish to discuss future plans for caretaking and seek out community options for in home/residential care should they become necessary.  Jeremy [name]. Sasaki is encouraged to attend to lifestyle factors for brain health (e.g., regular physical exercise, good nutrition habits and consideration of the MIND-DASH diet, regular participation in cognitively-stimulating activities, and general stress management techniques), which are likely to have benefits for both emotional adjustment and cognition. Optimal control of vascular risk factors (including safe cardiovascular exercise and adherence to dietary recommendations) is encouraged. Continued participation in activities which provide mental stimulation and social interaction is also recommended.   Important information should be provided to Jeremy [name]. [name] in written format in all instances. This information should be placed in a highly frequented and easily visible location within his home to promote recall. External strategies such as written notes in a consistently used memory journal, visual and nonverbal auditory cues such as a calendar on the refrigerator or appointments with alarm, such as on a cell phone, can also help maximize recall.  To address problems with processing speed, he may wish to consider:   -Ensuring that he is alerted when essential material or instructions are being presented   -Adjusting the speed at which new information is presented    -Allowing for more time in comprehending, processing, and responding in conversation   -Repeating and paraphrasing instructions or conversations aloud  To address problems with fluctuating attention and/or executive dysfunction, he may wish to consider:   -Avoiding external distractions when needing to concentrate   -Limiting exposure to fast paced environments with multiple sensory demands   -Writing down complicated information and using checklists   -Attempting and completing one task at a time (i.e., no multi-tasking)   -Verbalizing aloud each step of a task to maintain focus   -Taking frequent breaks during the completion of steps/tasks to avoid fatigue   -Reducing the amount of information considered at one time   -Scheduling more difficult activities for a time of day where he is usually most alert  Review of Records:   Jeremy [name] completed a comprehensive neuropsychological evaluation with myself on 11/06/2023. Results suggested severe impairment surrounding executive functioning, visuospatial abilities, and all aspects of learning and memory. Additional impairments were exhibited across processing speed and complex attention. He generally denied functional concerns and was diagnosed with a mild neurocognitive disorder, towards the severe end of the spectrum. Concerns were expressed for an atypical Alzheimer's disease such as PCA or the frontal variant. An FDG-PET scan was recommended.   Past Medical History:  Diagnosis Date   Arthritis    both knees    Biliary dyskinesia 06/06/2022   Chronic cholecystitis with calculus 05/23/2022   GERD (gastroesophageal reflux disease)    Headache    migraines on occasion - used imitrex 5- 6 yrs. ago    History of kidney stones    cystoscopy x1, also has past spontaneously   Hypertension    Insomnia 08/29/2022   Irritable bowel syndrome (IBS)    Major depressive disorder 10/03/2022   Mild cognitive impairment, severe, uncertain or unknown  etiology 11/07/2023   Peripheral vascular disease 2009   post op; treated at [facility]   PONV (postoperative nausea and vomiting)  Primary osteoarthritis of right knee 03/05/2016   Pulmonary embolism 1992   post op- 1 week     Seasonal allergies     Past Surgical History:  Procedure Laterality Date   ANKLE SURGERY Right    repair of fracture & repair of torn tendons.   BIOPSY  11/02/2022   Procedure: BIOPSY;  Surgeon: Cindie Carlin POUR, DO;  Location: [facility];  Service: Endoscopy;;   CARDIAC CATHETERIZATION     diagnostic only   CHOLECYSTECTOMY  08/2022   COLONOSCOPY WITH PROPOFOL  N/A 11/02/2022   Procedure: COLONOSCOPY WITH PROPOFOL ;  Surgeon: Cindie Carlin POUR, DO;  Location: [facility];  Service: Endoscopy;  Laterality: N/A;  1:15pm, asa 3   KNEE SURGERY     8 arthroscopies & one repair of tendon- R    PERIPHERALLY INSERTED CENTRAL CATHETER INSERTION Right 2001   staph infection in R knee, post surgical, treated with antibiotics     POLYPECTOMY  11/02/2022   Procedure: POLYPECTOMY;  Surgeon: Cindie Carlin POUR, DO;  Location: [facility];  Service: Endoscopy;;   TONSILLECTOMY     TOTAL KNEE ARTHROPLASTY Left 03/05/2016   Procedure: LEFT TOTAL KNEE ARTHROPLASTY;  Surgeon: Norleen Gavel, MD;  Location: [facility] OR;  Service: Orthopedics;  Laterality: Left;   TOTAL KNEE ARTHROPLASTY Right 10/22/2016   TOTAL KNEE ARTHROPLASTY Right 10/22/2016   Procedure: TOTAL KNEE ARTHROPLASTY;  Surgeon: Norleen Gavel, MD;  Location: [facility] OR;  Service: Orthopedics;  Laterality: Right;   VOCAL CORD INJECTION  2002   bilateral medialization, implants on both vocal chords , done at [facility].    Current Outpatient Medications:    aspirin  81 MG chewable tablet, Chew 81 mg by mouth daily., Disp: , Rfl:    clonazePAM (KLONOPIN) 0.5 MG tablet, Take 0.5 mg by mouth at bedtime., Disp: , Rfl:    Eszopiclone  3 MG TABS, TAKE 1 TABLET BY MOUTH immediately BEFORE bedtime, Disp: 30 tablet, Rfl: 5    fexofenadine (ALLEGRA) 180 MG tablet, Take 180 mg by mouth daily before breakfast., Disp: , Rfl:    hydrochlorothiazide  (HYDRODIURIL ) 25 MG tablet, Take 25 mg by mouth daily., Disp: , Rfl:    imipramine (TOFRANIL) 10 MG tablet, Take 20 mg by mouth at bedtime., Disp: , Rfl:    memantine  (NAMENDA ) 10 MG tablet, Take 1 tablet twice a day, Disp: 180 tablet, Rfl: 3   metoprolol  succinate (TOPROL -XL) 25 MG 24 hr tablet, Take 25 mg by mouth at bedtime., Disp: , Rfl:    olmesartan (BENICAR) 40 MG tablet, Take 40 mg by mouth daily., Disp: , Rfl:    omeprazole (PRILOSEC) 20 MG capsule, Take 20-40 mg by mouth 2 (two) times daily before a meal. 40 mg every morning and 20 mg every evening, Disp: , Rfl:    tamsulosin (FLOMAX) 0.4 MG CAPS capsule, Take 0.4 mg by mouth at bedtime., Disp: , Rfl:    warfarin (COUMADIN ) 3 MG tablet, Take 3 mg by mouth daily at 6 PM., Disp: , Rfl:      04/12/2023   10:10 AM  Montreal Cognitive Assessment   Visuospatial/ Executive (0/5) 1  Naming (0/3) 3  Attention: Read list of digits (0/2) 2  Attention: Read list of letters (0/1) 1  Attention: Serial 7 subtraction starting at 100 (0/3) 1  Language: Repeat phrase (0/2) 0  Language : Fluency (0/1) 0  Abstraction (0/2) 2  Delayed Recall (0/5) 4  Orientation (0/6) 5  Total 19  Adjusted Score (based on  education) 19   Neuroimaging: Brain MRI on 06/02/2023 suggested moderate cerebral volume loss with ventriculomegaly (suspected ex vacuo) and mild microvascular ischemic disease. FDG-PET scan on 11/28/2023 suggested a marked decrease loss of relative cortical metabolism in the high parietal lobes, representing a typical Alzheimer's disease pattern. 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 Brain MRI on 07/12/2024 was stable.    Clinical Interview:   The following information was obtained during a clinical interview with Jeremy [name] and his wife prior to  cognitive testing.  Cognitive Symptoms: Decreased short-term memory: Endorsed. Currently, Jeremy [name]. Ceci tended to diminish memory concerns, highlighting primary difficulties misplacing objects in his environment. With direct questioning, he acknowledged some difficulties surrounding the recollection of names and details of recent conversations. However, he diminished these concerns and theorized that they had improved over time. His wife reported concern for progressive decline over time, including the year since his previous December 2024 evaluation. Her examples surrounded greater difficulty with rapid forgetting and increased repetition in day-to-day conversation. Difficulties have been present for at least the prior two years.  Decreased long-term memory: Denied. Decreased attention/concentration: Denied. Reduced processing speed: Denied. He previously described his mind as never stops. Difficulties with executive functions: Endorsed. He previously reported trouble with organization, multi-tasking, and thinking through more complex, multi-step projects. This was stable over time. He denied trouble with impulsivity and his wife did not report any significant personality changes.  Difficulties with emotion regulation: Denied. Difficulties with receptive language: Denied. Difficulties with word finding: Endorsed occasionally.  Decreased visuoperceptual ability: Denied.   Difficulties completing ADLs: Somewhat. Per he and his wife, he has maintained independence with medication management. His wife is in charge of landscape architect and bill paying. He has greatly diminished his overall driving and currently keeps things local. Some of this was due to driving-related anxiety. Medical records suggest that he took a leave of absence from his employment around May 2024 due to concerns surrounding memory and other cognitive decline. He has not returned to work since.  Additional Medical  History: History of traumatic brain injury/concussion: Endorsed. He reported being knocked out on several occasions throughout his life. His most recent occurrence was said to have occurred at least 5-6 years prior where he was struck in his head by the head of one of his horses which was startled. He was unsure if he experienced a complete loss in consciousness but was temporarily dazed and disoriented. He denied any persisting cognitive difficulties from this event or any other more remote head injury.  History of stroke: Denied. History of seizure activity: Denied. History of known exposure to toxins: Denied. Symptoms of chronic pain: Denied. Experience of frequent headaches/migraines: Denied. He did report experiencing fairly infrequent migraine headaches.   Frequent instances of dizziness/vertigo: Denied.   Sensory changes: He wears glasses with benefit. Other sensory changes/difficulties (e.g., hearing, taste, smell) were denied.  Balance/coordination difficulties: Denied. Other motor difficulties: Denied.  Sleep History: Estimated hours obtained each night: 4-6 hours. Difficulties falling asleep: Endorsed. Difficulties staying asleep: Endorsed. He described his sleep as quite broken in nature, partly due to being a very [place] sleeper at baseline, and that the sleep he is able to obtain is not consistent.  Feels rested and refreshed upon awakening: Endorsed.   History of snoring: Endorsed. History of waking up gasping for air: Endorsed. Witnessed breath cessation while asleep: Endorsed. He completed a sleep study prior to his previous evaluation which reportedly  suggested mild to moderate obstructive sleep apnea and the recommendation that he initiate BiPAP treatment. I was unable to locate these records in his chart. He expressed a fairly steadfast opposition to CPAP/BiPAP intervention at that time. This opposition was unchanged currently. As such, this condition remains untreated.     History of vivid dreaming: Endorsed. Excessive movement while asleep: Denied outside of some restless leg movements.  Instances of acting out his dreams: Denied.  Psychiatric/Behavioral Health History: Depression: He acknowledged a history of depressive experiences throughout his life, as well as current symptoms. These were generally mild in nature. Current or remote suicidal ideation, intent, or plan was denied.  Anxiety: He described situational anxiety, especially while driving. His wife described him experiencing more longstanding and generalized anxious distress, often coinciding with depressive symptoms.  Mania: Denied. Trauma History: Denied. Visual/auditory hallucinations: Denied. Delusional thoughts: Denied.   Tobacco: Denied. Alcohol: He denied current alcohol consumption as well as a history of problematic alcohol abuse or dependence.  Recreational drugs: Denied.  Family History: Problem Relation Age of Onset   Stroke Mother    Heart attack Mother    Heart Problems Mother    Hypertension Mother    Diabetes Mother    Allergies Mother    Heart attack Father    Stroke Father    Dementia Maternal Grandmother    ALS Other    This information was confirmed by Jeremy [name].  Academic/Vocational History: Highest level of educational attainment: 18 years. He earned a Education Administrator degree in counseling and described himself as a designer, multimedia in academic settings. No relative weaknesses were described.  History of developmental delay: Denied. History of grade repetition: Denied. Enrollment in special education courses: Denied. History of LD/ADHD: Denied.   Employment: Unemployed/Retired. He took a leave of absence around May 2024 due to memory concerns. 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 He has been unable to return to work.  Evaluation Results:   Behavioral Observations: Jeremy [name]  was accompanied by his wife, arrived to his appointment on time, and was appropriately dressed and groomed. He appeared alert. Observed gait and station were within normal limits. Gross motor functioning appeared intact upon informal observation and no abnormal movements (e.g., tremors) were noted. His affect was generally relaxed and positive. Spontaneous speech was fluent and word finding difficulties were not observed during the clinical interview. Thought processes were coherent, organized, and normal in content. Insight into his cognitive difficulties appeared poor and I do not believe he has appropriate insight into the true extent of ongoing cognitive impairment.   During testing, sustained attention was appropriate. Task engagement was adequate and he persisted when challenged. Overall, Jeremy [name] was cooperative with the clinical interview and subsequent testing procedures.   Adequacy of Effort: The validity of neuropsychological testing is limited by the extent to which the individual being tested may be assumed to have exerted adequate effort during testing. Jeremy [name] expressed his intention to perform to the best of his abilities and exhibited adequate task engagement and persistence. As such, the results of the current evaluation are believed to be a valid representation of Jeremy [name] current cognitive functioning.  Test Results: Jeremy [name] was oriented at the time of the current evaluation.  Intellectual abilities based upon educational and vocational attainment were estimated to be in the average to above average range. Premorbid abilities were estimated to be within the average range  based upon a single-word reading test.   Processing speed was exceptionally low. Basic attention was well below average. More complex attention (e.g., working memory) was also well below average. Executive functioning was largely exceptionally low. He did perform in the average range across a reasoning  task.  Receptive language abilities were exceptionally low. Likewise, Jeremy [name] had noteworthy difficulties comprehending task instructions at times, especially those more complex in nature. Assessed expressive language was somewhat variable. Phonemic fluency was below average to average, semantic fluency was well below average to below average, and confrontation naming was well below average.    Assessed visuospatial/visuoconstructional abilities were exceptionally low. When asked to draw a clock, Jeremy [name] was unable to draw a complete circle. He was able to arrange numbers in a circular fashion. However, numbers 2, 6, and 7 were included three times. When asked to place the clock hands, he drew a dash outside of his circular numerical arrangement and simply wrote the time (incorrectly). When copying a complex figure, he exhibited quite severe distortions, including the inability to draw the outer rectangle.     Learning (i.e., encoding) of novel verbal and visual information was exceptionally low to below average. Spontaneous delayed recall (i.e., retrieval) of previously learned information was also exceptionally low to below average. Retention rates were 0% across a list learning task, 0% across a story learning task, 45% across a daily living task, and 200% (likely artificially inflated by happenstance guessing given the nature of this forced choice format) across a shape learning task. Performance across recognition tasks was exceptionally low to well below average, suggesting negligible evidence for information consolidation.   Results of emotional screening instruments suggested that recent symptoms of generalized anxiety were in the minimal range, while symptoms of depression were within normal limits. A screening instrument assessing recent sleep quality suggested the presence of minimal sleep dysfunction.  Table of Scores:   Note: This summary of test scores accompanies the interpretive  report and should not be considered in isolation without reference to the appropriate sections in the text. Descriptors are based on appropriate normative data and may be adjusted based on clinical judgment. Terms such as Within Normal Limits and Outside Normal Limits are used when a more specific description of the test score cannot be determined. Descriptors refer to the current evaluation only.        Percentile - Normative Descriptor > 98 - Exceptionally High 91-97 - Well Above Average 75-90 - Above Average 25-74 - Average 9-24 - Below Average 2-8 - Well Below Average < 2 - Exceptionally Low        Orientation: December 2024 Current     Raw Score Raw Score Percentile   NAB Orientation, Form 1 28/29 28/29 --- ---        Cognitive Screening:       Raw Score Raw Score Percentile   SLUMS: 20/30 13/30 --- ---        Intellectual Functioning:       Standard Score Standard Score Percentile   Test of Premorbid Functioning: 100 99 47 Average        Memory:      NAB Memory Module, Form 1: Standard Score/ T Score Standard Score/ T Score Percentile   Total Memory Index 59 60 <1 Exceptionally Low  List Learning        Total Trials 1-3 17/36 (36) 18/36 (40) 16 Below Average    List B 1/12 (24) 4/12 (47) 38 Average  Short Delay Free Recall 5/12 [phone] 0/12 (19) <1 Exceptionally Low    Long Delay Free Recall 3/12 (27) 1/12 (23) <1 Exceptionally Low    Retention Percentage 60 (35) 0 (<19) <1 Exceptionally Low    Recognition Discriminability 9 (54) -1 (25) 1 Exceptionally Low  Shape Learning        Total Trials 1-3 7/27 (22) 6/27 (20) <1 Exceptionally Low    Delayed Recall 2/9 (21) 4/9 (38) 12 Below Average    Retention Percentage 50 (33) 200 (83) >99 Exceptionally High    Recognition Discriminability 3 (32) 3 (32) 4 Well Below Average  Story Learning        Immediate Recall 31/80 (25) 49/80 (38) 12 Below Average    Delayed Recall 0/40 (27) 0/40 (27) 1 Exceptionally Low    Retention  Percentage 0 (<19) 0 (<19) <1 Exceptionally Low  Daily Living Memory        Immediate Recall 32/51 (32) 26/51 (27) 1 Exceptionally Low    Delayed Recall 4/17 (19) 5/17 (19) <1 Exceptionally Low    Retention Percentage 31 (<19) 45 (21) <1 Exceptionally Low    Recognition Hits 5/10 (<19) 7/10 (36) 8 Well Below Average        Attention/Executive Function:      Trail Making Test (TMT): Raw Score (T Score) Raw Score (T Score) Percentile     Part A 116 secs.,  3 errors (<19) 150 secs.,  1 error (<19) <1 Exceptionally Low    Part B Discontinued Discontinued --- Impaired          Scaled Score Scaled Score Percentile   WAIS-IV Coding: --- 1 <1 Exceptionally Low        NAB Attention Module, Form 1: T Score T Score Percentile     Digits Forward 48 36 8 Well Below Average    Digits Backwards 32 32 4 Well Below Average         Scaled Score Scaled Score Percentile   WAIS-IV Similarities: 8 8 25  Average        D-KEFS Color-Word Interference Test: Raw Score (Scaled Score) Raw Score (Scaled Score) Percentile     Color Naming 56 secs. (1) --- --- ---    Word Reading 30 secs. (7) --- --- ---    Inhibition Discontinued --- --- ---    Inhibition/Switching Not Attempted --- --- ---        D-KEFS Verbal Fluency Test: Raw Score (Scaled Score) Raw Score (Scaled Score) Percentile     Letter Total Correct 32 (9) 29 (8) 25 Average    Category Total Correct 24 (6) 25 (6) 9 Below Average    Category Switching Total Correct 7 (3) 6 (2) <1 Exceptionally Low    Category Switching Accuracy 5 (4) 3 (2) <1 Exceptionally Low      Total Set Loss Errors 2 (10) 3 (9) 37 Average      Total Repetition Errors 1 (12) 3 (10) 50 Average        Language:      Verbal Fluency Test: Raw Score (T Score) Raw Score (T Score) Percentile     Phonemic Fluency (FAS) 32 (38) 29 (39) 14 Below Average    Animal Fluency 16 (38) 13 (31) 3 Well Below Average         NAB Language Module, Form 1: T Score T Score Percentile      Auditory Comprehension 51 19 <1 Exceptionally Low    Naming 30/31 (53) 28/31 (33)  5 Well Below Average        Visuospatial/Visuoconstruction:       Raw Score Raw Score Percentile   Clock Drawing: 5/10 1/10 --- Impaired  [place]oper VOT: --- 8.5/30 --- Very High Probability  of Impairment  Judgment of Line Orientation, Form V: --- Discontinued --- Impaired        NAB Spatial Module, Form 1: T Score T Score Percentile     Figure Drawing Copy 24 25 1  Exceptionally Low         Scaled Score Scaled Score Percentile   WAIS-IV Block Design: 2 1 <1 Exceptionally Low        Mood and Personality:       Raw Score Raw Score Percentile   PROMIS Depression Questionnaire: 21 8 --- None to Slight  PROMIS Anxiety Questionnaire: 23 7 --- None to Slight        Additional Questionnaires:       Raw Score Raw Score Percentile   PROMIS Sleep Disturbance Questionnaire: 23 12 --- None to Slight   Informed Consent and Coding/Compliance:   The current evaluation represents a clinical evaluation for the purposes previously outlined by the referral source and is in no way reflective of a forensic evaluation.   Jeremy [name]. Voght was provided with a verbal description of the nature and purpose of the present neuropsychological evaluation. Also reviewed were the foreseeable risks and/or discomforts and benefits of the procedure, limits of confidentiality, and mandatory reporting requirements of this provider. The patient was given the opportunity to ask questions and receive answers about the evaluation. Oral consent to participate was provided by the patient.   This evaluation was conducted by Arthea KYM Maryland, Ph.D., ABPP-CN, board certified clinical neuropsychologist. Jeremy [name]. Kimmey completed a clinical interview with Dr. Maryland, billed as one unit [phone], and 160 minutes of cognitive testing and scoring, billed as one unit [phone] and four additional units [id number]. Psychometrist Lonell Jude, B.S. assisted Dr. Maryland with test  administration and scoring procedures. As a separate and discrete service, one unit [phone] and two units 96133 (180 minutes) were billed for Dr. Loralee time spent in interpretation and report writing.

## 2024-11-13 ENCOUNTER — Encounter: Payer: Self-pay | Admitting: Psychology

## 2024-11-19 ENCOUNTER — Ambulatory Visit: Payer: Managed Care, Other (non HMO) | Admitting: Psychology

## 2024-11-19 DIAGNOSIS — G309 Alzheimer's disease, unspecified: Secondary | ICD-10-CM

## 2024-11-19 DIAGNOSIS — F0280 Dementia in other diseases classified elsewhere without behavioral disturbance: Secondary | ICD-10-CM

## 2024-11-19 NOTE — Progress Notes (Signed)
° °  Neuropsychology Feedback Session [name] [facility] [place] Department of Neurology  Reason for Referral:   [name] is a 63 y.o. right-handed Caucasian male referred by Camie Sevin, PA-C, to characterize his current cognitive functioning and assist with diagnostic clarity and treatment planning in the context of ongoing cognitive impairment and concern for progressive worsening over time.   Feedback:   Jeremy [name] completed a comprehensive neuropsychological evaluation on 11/12/2024. Please refer to that encounter for the full report and recommendations. Briefly, results suggested diffuse cognitive impairment. He exhibits quite severe impairment surrounding executive functioning, visuospatial abilities, and learning/memory in particular. Additional impairments were exhibited across processing speed, attention/concentration, receptive language, and confrontation naming. Performance variability was exhibited across expressive language. Regarding the cause for his dementia presentation, concerns were previously expressed for an atypical Alzheimer's variant given his relatively young age, advanced impairment, and overall testing patterns. In the interim, he had an FDG-PET scan performed. 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 This illness represents my primary concern for the cause of cognitive impairment and progressive decline over time. Continued medical monitoring will be important moving forward.  Jeremy [name] was accompanied by his wife and daughter during the current feedback session. Content of the current session focused on the results of his neuropsychological evaluation. Jeremy [name] was given the opportunity to ask questions and his questions were answered. He  was encouraged to reach out should additional questions arise. A copy of his report was provided at the conclusion of the visit.      One unit 96132 (35 minutes) was billed for Dr. Loralee time spent preparing for, conducting, and documenting the current feedback session with Jeremy [name].

## 2024-11-30 ENCOUNTER — Encounter: Payer: Self-pay | Admitting: Physician Assistant

## 2025-01-01 ENCOUNTER — Other Ambulatory Visit: Payer: Self-pay | Admitting: Physician Assistant

## 2025-01-14 ENCOUNTER — Ambulatory Visit: Admitting: Physician Assistant
